# Patient Record
Sex: Male | Born: 1955 | Race: White | Hispanic: No | Smoking: Former smoker
Health system: Southern US, Community
[De-identification: ages and names within clinical notes are randomized; demographics above are authoritative.]

## PROBLEM LIST (undated history)

## (undated) DIAGNOSIS — M1990 Unspecified osteoarthritis, unspecified site: Secondary | ICD-10-CM

## (undated) DIAGNOSIS — F32A Depression, unspecified: Secondary | ICD-10-CM

## (undated) DIAGNOSIS — M419 Scoliosis, unspecified: Secondary | ICD-10-CM

## (undated) DIAGNOSIS — J189 Pneumonia, unspecified organism: Secondary | ICD-10-CM

## (undated) DIAGNOSIS — F329 Major depressive disorder, single episode, unspecified: Secondary | ICD-10-CM

## (undated) DIAGNOSIS — I10 Essential (primary) hypertension: Secondary | ICD-10-CM

## (undated) DIAGNOSIS — D649 Anemia, unspecified: Secondary | ICD-10-CM

## (undated) DIAGNOSIS — Z8619 Personal history of other infectious and parasitic diseases: Secondary | ICD-10-CM

## (undated) DIAGNOSIS — T7840XA Allergy, unspecified, initial encounter: Secondary | ICD-10-CM

## (undated) HISTORY — DX: Personal history of other infectious and parasitic diseases: Z86.19

## (undated) HISTORY — DX: Depression, unspecified: F32.A

## (undated) HISTORY — PX: COLONOSCOPY: SHX174

## (undated) HISTORY — DX: Essential (primary) hypertension: I10

## (undated) HISTORY — DX: Allergy, unspecified, initial encounter: T78.40XA

## (undated) HISTORY — DX: Major depressive disorder, single episode, unspecified: F32.9

## (undated) HISTORY — PX: TONSILLECTOMY AND ADENOIDECTOMY: SHX28

---

## 2017-11-21 LAB — HM COLONOSCOPY

## 2018-05-02 ENCOUNTER — Ambulatory Visit (INDEPENDENT_AMBULATORY_CARE_PROVIDER_SITE_OTHER): Payer: Self-pay | Admitting: Nurse Practitioner

## 2018-05-02 ENCOUNTER — Other Ambulatory Visit (INDEPENDENT_AMBULATORY_CARE_PROVIDER_SITE_OTHER): Payer: Self-pay

## 2018-05-02 ENCOUNTER — Encounter: Payer: Self-pay | Admitting: Nurse Practitioner

## 2018-05-02 VITALS — BP 160/110 | HR 66 | Ht 69.75 in | Wt 165.0 lb

## 2018-05-02 DIAGNOSIS — R202 Paresthesia of skin: Secondary | ICD-10-CM

## 2018-05-02 DIAGNOSIS — Z13220 Encounter for screening for lipoid disorders: Secondary | ICD-10-CM

## 2018-05-02 DIAGNOSIS — I10 Essential (primary) hypertension: Secondary | ICD-10-CM

## 2018-05-02 LAB — CBC
HEMATOCRIT: 40.9 % (ref 39.0–52.0)
HEMOGLOBIN: 14.2 g/dL (ref 13.0–17.0)
MCHC: 34.6 g/dL (ref 30.0–36.0)
MCV: 92.6 fl (ref 78.0–100.0)
Platelets: 297 10*3/uL (ref 150.0–400.0)
RBC: 4.42 Mil/uL (ref 4.22–5.81)
RDW: 13 % (ref 11.5–15.5)
WBC: 5.5 10*3/uL (ref 4.0–10.5)

## 2018-05-02 LAB — VITAMIN B12: Vitamin B-12: 300 pg/mL (ref 211–911)

## 2018-05-02 LAB — TSH: TSH: 1.08 u[IU]/mL (ref 0.35–4.50)

## 2018-05-02 LAB — LIPID PANEL
CHOL/HDL RATIO: 2
Cholesterol: 139 mg/dL (ref 0–200)
HDL: 63.6 mg/dL (ref >39.00)
LDL Cholesterol: 62 mg/dL (ref 0–99)
NonHDL: 75.27
Triglycerides: 66 mg/dL (ref 0.0–149.0)
VLDL: 13.2 mg/dL (ref 0.0–40.0)

## 2018-05-02 LAB — COMPREHENSIVE METABOLIC PANEL
ALT: 12 U/L (ref 0–53)
AST: 14 U/L (ref 0–37)
Albumin: 4.1 g/dL (ref 3.5–5.2)
Alkaline Phosphatase: 64 U/L (ref 39–117)
BILIRUBIN TOTAL: 0.8 mg/dL (ref 0.2–1.2)
BUN: 18 mg/dL (ref 6–23)
CO2: 28 meq/L (ref 19–32)
CREATININE: 0.77 mg/dL (ref 0.40–1.50)
Calcium: 9.5 mg/dL (ref 8.4–10.5)
Chloride: 104 mEq/L (ref 96–112)
GFR: 107.95 mL/min (ref >60.00)
GLUCOSE: 99 mg/dL (ref 70–99)
Potassium: 4 mEq/L (ref 3.5–5.1)
Sodium: 139 mEq/L (ref 135–145)
Total Protein: 6.9 g/dL (ref 6.0–8.3)

## 2018-05-02 LAB — MAGNESIUM: Magnesium: 2.3 mg/dL (ref 1.5–2.5)

## 2018-05-02 MED ORDER — LISINOPRIL 40 MG PO TABS: 40.0000 mg | ORAL_TABLET | Freq: Every day | ORAL | 1 refills | Status: DC

## 2018-05-02 MED ORDER — HYDROCHLOROTHIAZIDE 25 MG PO TABS: 25.0000 mg | ORAL_TABLET | Freq: Every day | ORAL | 2 refills | Status: DC

## 2018-05-02 NOTE — Patient Instructions (Signed)
Please head downstairs for lab work. If any of your test results are critically abnormal, you will be contacted right away. Otherwise, I will contact you within a week about your test results and follow up recommendations  Please continue your HCTZ Please increase lisinopril to 40mg  daily Please return in about 2 weeks for follow up  It was nice to meet you. Thanks for letting me take care of you.   DASH Eating Plan DASH stands for "Dietary Approaches to Stop Hypertension." The DASH eating plan is a healthy eating plan that has been shown to reduce high blood pressure (hypertension). It may also reduce your risk for type 2 diabetes, heart disease, and stroke. The DASH eating plan may also help with weight loss. What are tips for following this plan? General guidelines  Avoid eating more than 2,300 mg (milligrams) of salt (sodium) a day. If you have hypertension, you may need to reduce your sodium intake to 1,500 mg a day.  Limit alcohol intake to no more than 1 drink a day for nonpregnant women and 2 drinks a day for men. One drink equals 12 oz of beer, 5 oz of wine, or 1 oz of hard liquor.  Work with your health care provider to maintain a healthy body weight or to lose weight. Ask what an ideal weight is for you.  Get at least 30 minutes of exercise that causes your heart to beat faster (aerobic exercise) most days of the week. Activities may include walking, swimming, or biking.  Work with your health care provider or diet and nutrition specialist (dietitian) to adjust your eating plan to your individual calorie needs. Reading food labels  Check food labels for the amount of sodium per serving. Choose foods with less than 5 percent of the Daily Value of sodium. Generally, foods with less than 300 mg of sodium per serving fit into this eating plan.  To find whole grains, look for the word "whole" as the first word in the ingredient list. Shopping  Buy products labeled as  "low-sodium" or "no salt added."  Buy fresh foods. Avoid canned foods and premade or frozen meals. Cooking  Avoid adding salt when cooking. Use salt-free seasonings or herbs instead of table salt or sea salt. Check with your health care provider or pharmacist before using salt substitutes.  Do not fry foods. Cook foods using healthy methods such as baking, boiling, grilling, and broiling instead.  Cook with heart-healthy oils, such as olive, canola, soybean, or sunflower oil. Meal planning   Eat a balanced diet that includes: ? 5 or more servings of fruits and vegetables each day. At each meal, try to fill half of your plate with fruits and vegetables. ? Up to 6-8 servings of whole grains each day. ? Less than 6 oz of lean meat, poultry, or fish each day. A 3-oz serving of meat is about the same size as a deck of cards. One egg equals 1 oz. ? 2 servings of low-fat dairy each day. ? A serving of nuts, seeds, or beans 5 times each week. ? Heart-healthy fats. Healthy fats called Omega-3 fatty acids are found in foods such as flaxseeds and coldwater fish, like sardines, salmon, and mackerel.  Limit how much you eat of the following: ? Canned or prepackaged foods. ? Food that is high in trans fat, such as fried foods. ? Food that is high in saturated fat, such as fatty meat. ? Sweets, desserts, sugary drinks, and other foods with added  sugar. ? Full-fat dairy products.  Do not salt foods before eating.  Try to eat at least 2 vegetarian meals each week.  Eat more home-cooked food and less restaurant, buffet, and fast food.  When eating at a restaurant, ask that your food be prepared with less salt or no salt, if possible. What foods are recommended? The items listed may not be a complete list. Talk with your dietitian about what dietary choices are best for you. Grains Whole-grain or whole-wheat bread. Whole-grain or whole-wheat pasta. Brown rice. [place]. Bulgur. Whole-grain  and low-sodium cereals. Pita bread. Low-fat, low-sodium crackers. Whole-wheat flour tortillas. Vegetables Fresh or frozen vegetables (raw, steamed, roasted, or grilled). Low-sodium or reduced-sodium tomato and vegetable juice. Low-sodium or reduced-sodium tomato sauce and tomato paste. Low-sodium or reduced-sodium canned vegetables. Fruits All fresh, dried, or frozen fruit. Canned fruit in natural juice (without added sugar). Meat and other protein foods Skinless chicken or [place]. Ground chicken or [place]. Pork with fat trimmed off. Fish and seafood. Egg whites. Dried beans, peas, or lentils. Unsalted nuts, nut butters, and seeds. Unsalted canned beans. Lean cuts of beef with fat trimmed off. Low-sodium, lean deli meat. Dairy Low-fat (1%) or fat-free (skim) milk. Fat-free, low-fat, or reduced-fat cheeses. Nonfat, low-sodium ricotta or cottage cheese. Low-fat or nonfat yogurt. Low-fat, low-sodium cheese. Fats and oils Soft margarine without trans fats. Vegetable oil. Low-fat, reduced-fat, or light mayonnaise and salad dressings (reduced-sodium). Canola, safflower, olive, soybean, and sunflower oils. Avocado. Seasoning and other foods Herbs. Spices. Seasoning mixes without salt. Unsalted popcorn and pretzels. Fat-free sweets. What foods are not recommended? The items listed may not be a complete list. Talk with your dietitian about what dietary choices are best for you. Grains Baked goods made with fat, such as croissants, muffins, or some breads. Dry pasta or rice meal packs. Vegetables Creamed or fried vegetables. Vegetables in a cheese sauce. Regular canned vegetables (not low-sodium or reduced-sodium). Regular canned tomato sauce and paste (not low-sodium or reduced-sodium). Regular tomato and vegetable juice (not low-sodium or reduced-sodium). Angie Fava. Olives. Fruits Canned fruit in a light or heavy syrup. Fried fruit. Fruit in cream or butter sauce. Meat and other protein foods Fatty cuts  of meat. Ribs. Fried meat. Berniece Salines. Sausage. Bologna and other processed lunch meats. Salami. Fatback. Hotdogs. Bratwurst. Salted nuts and seeds. Canned beans with added salt. Canned or smoked fish. Whole eggs or egg yolks. Chicken or [place] with skin. Dairy Whole or 2% milk, cream, and half-and-half. Whole or full-fat cream cheese. Whole-fat or sweetened yogurt. Full-fat cheese. Nondairy creamers. Whipped toppings. Processed cheese and cheese spreads. Fats and oils Butter. Stick margarine. Lard. Shortening. Ghee. Bacon fat. Tropical oils, such as coconut, palm kernel, or palm oil. Seasoning and other foods Salted popcorn and pretzels. Onion salt, garlic salt, seasoned salt, table salt, and sea salt. Worcestershire sauce. Tartar sauce. Barbecue sauce. Teriyaki sauce. Soy sauce, including reduced-sodium. Steak sauce. Canned and packaged gravies. Fish sauce. Oyster sauce. Cocktail sauce. Horseradish that you find on the shelf. Ketchup. Mustard. Meat flavorings and tenderizers. Bouillon cubes. Hot sauce and Tabasco sauce. Premade or packaged marinades. Premade or packaged taco seasonings. Relishes. Regular salad dressings. Where to find more information:  National Heart, Lung, and [place]: https://wilson-eaton.com/  American Heart Association: www.heart.org Summary  The DASH eating plan is a healthy eating plan that has been shown to reduce high blood pressure (hypertension). It may also reduce your risk for type 2 diabetes, heart disease, and stroke.  With the DASH eating  plan, you should limit salt (sodium) intake to 2,300 mg a day. If you have hypertension, you may need to reduce your sodium intake to 1,500 mg a day.  When on the DASH eating plan, aim to eat more fresh fruits and vegetables, whole grains, lean proteins, low-fat dairy, and heart-healthy fats.  Work with your health care provider or diet and nutrition specialist (dietitian) to adjust your eating plan to your individual calorie  needs. This information is not intended to replace advice given to you by your health care provider. Make sure you discuss any questions you have with your health care provider. Document Released: 08/16/2011 Document Revised: 08/20/2016 Document Reviewed: 08/20/2016 Elsevier Interactive Patient Education  [employer].

## 2018-05-02 NOTE — Assessment & Plan Note (Signed)
Will increase lisinopril dosage today, continue HCTZ dosage at home Encouraged to monitor BP at home Additional education provided on AVS RTC in 2 weeks for F/U- recheck BP, BMET - hydrochlorothiazide (HYDRODIURIL) 25 MG tablet; Take 1 tablet (25 mg total) by mouth daily.  Dispense: 90 tablet; Refill: 2 - lisinopril (PRINIVIL,ZESTRIL) 40 MG tablet; Take 1 tablet (40 mg total) by mouth daily.  Dispense: 30 tablet; Refill: 1 - Lipid panel; Future - TSH; Future - CBC; Future - Comprehensive metabolic panel; Future

## 2018-05-02 NOTE — Progress Notes (Signed)
Name: Henry [name]   MRN: [id number]    DOB: 1955-09-25   Date:05/02/2018       Progress Note  Subjective  Chief Complaint  Establish care  HPI  Henry [name] is here today as a new patient to our practice, just moved back to [place] from [place] after his wife passed away, no family here but did grow up here and has moved back here to live with a friend. He is retired but has been active around [employer], joined a church and enjoys local music around town. Aside from his prior PCP in [place], he does not routinely see any specialists. He is maintained on daily blood pressure medications, needing refill today. He would also like to discuss complaint of numbness and tingling. He is maintained on lisinopril 20, HCTZ 25 daily, has been taking both of these medications for some time now, reports he tolerates both medications well without any noted adverse side effects. He does occasionally check his BP at home, readings typically around 140s/90s and although he says his blood pressure is often higher in medical settings, his prior PCP had discussed possibly increasing his bp med doses due to continuing to have elevated bp readings, he is willing to increase medication dosages today if needed. He tells me that hes noticed a mild tingling sensation to left fingertips and left toes which has persisted over past several months now. No pain, weakness, syncope, confusion, headaches, vision changes, chest pain, shortness of breath, edema. He is an occasional drinker.   BP Readings from Last 3 Encounters:  05/02/18 (!) 160/110    Past Surgical History:  Procedure Laterality Date  . TONSILLECTOMY AND ADENOIDECTOMY      History reviewed. No pertinent family history.  Social History   Socioeconomic History  . Marital status: Married    Spouse name: Not on file  . Number of children: Not on file  . Years of education: Not on file  . Highest education level: Not on file  Occupational History  . Not on file   Social Needs  . Financial resource strain: Not on file  . Food insecurity:    Worry: Not on file    Inability: Not on file  . Transportation needs:    Medical: Not on file    Non-medical: Not on file  Tobacco Use  . Smoking status: Former Research scientist (life sciences)  . Smokeless tobacco: Never Used  Substance and Sexual Activity  . Alcohol use: Yes  . Drug use: Not Currently  . Sexual activity: Not on file  Lifestyle  . Physical activity:    Days per week: Not on file    Minutes per session: Not on file  . Stress: Not on file  Relationships  . Social connections:    Talks on phone: Not on file    Gets together: Not on file    Attends religious service: Not on file    Active member of club or organization: Not on file    Attends meetings of clubs or organizations: Not on file    Relationship status: Not on file  . Intimate partner violence:    Fear of current or ex partner: Not on file    Emotionally abused: Not on file    Physically abused: Not on file    Forced sexual activity: Not on file  Other Topics Concern  . Not on file  Social History Narrative  . Not on file     Current Outpatient Medications:  .  hydrochlorothiazide (HYDRODIURIL) 25 MG tablet, Take 1 tablet (25 mg total) by mouth daily., Disp: 90 tablet, Rfl: 2 .  lisinopril (PRINIVIL,ZESTRIL) 40 MG tablet, Take 1 tablet (40 mg total) by mouth daily., Disp: 30 tablet, Rfl: 1  No Known Allergies   ROS See HPI  Objective  Vitals:   05/02/18 0931  BP: (!) 160/110  Pulse: 66  SpO2: 97%  Weight: 165 lb (74.8 kg)  Height: 5' 9.75" (1.772 m)    Body mass index is 23.85 kg/m.  Physical Exam Vital signs reviewed. Constitutional: Patient appears well-developed and well-nourished. No distress.  HENT: Head: Normocephalic and atraumatic.  Nose: Nose normal. Mouth/Throat: Oropharynx is clear and moist. No oropharyngeal exudate.  Eyes: Conjunctivae and EOM are normal. Pupils are equal, round, and reactive to light. No  scleral icterus.  Neck: Normal range of motion. Neck supple. No thyromegaly present. Cardiovascular: Normal rate, regular rhythm and normal heart sounds.  No murmur heard. No BLE edema. Distal pulses intact. Pulmonary/Chest: Effort normal and breath sounds normal. No respiratory distress. Musculoskeletal: Normal range of motion, No gross deformities Neurological: He is alert and oriented to person, place, and time. No cranial nerve deficit. Coordination, balance, strength, speech and gait are normal.  Skin: Skin is warm and dry. No rash noted. No erythema.  Psychiatric: Patient has a normal mood and affect. behavior is normal. Judgment and thought content normal.  Assessment & Plan RTC in 2 weeks for F/U: HTN- increasing lisinopril, recheck BP, repeat BMET  Paresthesia Labs today F/U with further recommendations pending lab results - Vitamin B12; Future - Magnesium; Future - TSH; Future - CBC; Future - Comprehensive metabolic panel; Future  Screening for cholesterol level Labs today F/U with further recommendations pending lab results Unsure if he is fasting - Lipid panel; Future

## 2018-05-08 ENCOUNTER — Encounter: Payer: Self-pay | Admitting: *Deleted

## 2018-05-21 ENCOUNTER — Other Ambulatory Visit (INDEPENDENT_AMBULATORY_CARE_PROVIDER_SITE_OTHER): Payer: Self-pay

## 2018-05-21 ENCOUNTER — Other Ambulatory Visit: Payer: Self-pay

## 2018-05-21 ENCOUNTER — Encounter: Payer: Self-pay | Admitting: Nurse Practitioner

## 2018-05-21 ENCOUNTER — Ambulatory Visit (INDEPENDENT_AMBULATORY_CARE_PROVIDER_SITE_OTHER): Payer: Self-pay | Admitting: Nurse Practitioner

## 2018-05-21 VITALS — BP 130/82 | HR 74 | Ht 69.75 in | Wt 164.0 lb

## 2018-05-21 DIAGNOSIS — I10 Essential (primary) hypertension: Secondary | ICD-10-CM

## 2018-05-21 DIAGNOSIS — Z1159 Encounter for screening for other viral diseases: Secondary | ICD-10-CM

## 2018-05-21 DIAGNOSIS — Z114 Encounter for screening for human immunodeficiency virus [HIV]: Secondary | ICD-10-CM

## 2018-05-21 DIAGNOSIS — Z9189 Other specified personal risk factors, not elsewhere classified: Secondary | ICD-10-CM

## 2018-05-21 DIAGNOSIS — Z23 Encounter for immunization: Secondary | ICD-10-CM

## 2018-05-21 LAB — BASIC METABOLIC PANEL
BUN: 14 mg/dL (ref 6–23)
CHLORIDE: 99 meq/L (ref 96–112)
CO2: 27 meq/L (ref 19–32)
CREATININE: 0.86 mg/dL (ref 0.40–1.50)
Calcium: 9.2 mg/dL (ref 8.4–10.5)
GFR: 95 mL/min (ref >60.00)
GLUCOSE: 93 mg/dL (ref 70–99)
Potassium: 3.8 mEq/L (ref 3.5–5.1)
SODIUM: 135 meq/L (ref 135–145)

## 2018-05-21 NOTE — Patient Instructions (Addendum)
Please schedule an office or nurse visit in 3 months for a blood pressure recheck, so we can make sure your blood pressure remains stable  It was nice to see you today!   Hypertension Hypertension is another name for high blood pressure. High blood pressure forces your heart to work harder to pump blood. This can cause problems over time. There are two numbers in a blood pressure reading. There is a top number (systolic) over a bottom number (diastolic). It is best to have a blood pressure below 120/80. Healthy choices can help lower your blood pressure. You may need medicine to help lower your blood pressure if:  Your blood pressure cannot be lowered with healthy choices.  Your blood pressure is higher than 130/80.  Follow these instructions at home: Eating and drinking  If directed, follow the DASH eating plan. This diet includes: ? Filling half of your plate at each meal with fruits and vegetables. ? Filling one quarter of your plate at each meal with whole grains. Whole grains include whole wheat pasta, brown rice, and whole grain bread. ? Eating or drinking low-fat dairy products, such as skim milk or low-fat yogurt. ? Filling one quarter of your plate at each meal with low-fat (lean) proteins. Low-fat proteins include fish, skinless chicken, eggs, beans, and tofu. ? Avoiding fatty meat, cured and processed meat, or chicken with skin. ? Avoiding premade or processed food.  Eat less than 1,500 mg of salt (sodium) a day.  Limit alcohol use to no more than 1 drink a day for nonpregnant women and 2 drinks a day for men. One drink equals 12 oz of beer, 5 oz of wine, or 1 oz of hard liquor. Lifestyle  Work with your doctor to stay at a healthy weight or to lose weight. Ask your doctor what the best weight is for you.  Get at least 30 minutes of exercise that causes your heart to beat faster (aerobic exercise) most days of the week. This may include walking, swimming, or biking.  Get  at least 30 minutes of exercise that strengthens your muscles (resistance exercise) at least 3 days a week. This may include lifting weights or pilates.  Do not use any products that contain nicotine or tobacco. This includes cigarettes and e-cigarettes. If you need help quitting, ask your doctor.  Check your blood pressure at home as told by your doctor.  Keep all follow-up visits as told by your doctor. This is important. Medicines  Take over-the-counter and prescription medicines only as told by your doctor. Follow directions carefully.  Do not skip doses of blood pressure medicine. The medicine does not work as well if you skip doses. Skipping doses also puts you at risk for problems.  Ask your doctor about side effects or reactions to medicines that you should watch for. Contact a doctor if:  You think you are having a reaction to the medicine you are taking.  You have headaches that keep coming back (recurring).  You feel dizzy.  You have swelling in your ankles.  You have trouble with your vision. Get help right away if:  You get a very bad headache.  You start to feel confused.  You feel weak or numb.  You feel faint.  You get very bad pain in your: ? Chest. ? Belly (abdomen).  You throw up (vomit) more than once.  You have trouble breathing. Summary  Hypertension is another name for high blood pressure.  Making healthy choices can  help lower blood pressure. If your blood pressure cannot be controlled with healthy choices, you may need to take medicine. This information is not intended to replace advice given to you by your health care provider. Make sure you discuss any questions you have with your health care provider. Document Released: 02/13/2008 Document Revised: 07/25/2016 Document Reviewed: 07/25/2016 Elsevier Interactive Patient Education  [employer].

## 2018-05-21 NOTE — Assessment & Plan Note (Signed)
BP has improved with medication adjustments, will continue current dosages RTC in 3 months for F/U, to recheck BP Additional education provided on AVS - Basic metabolic panel; Future

## 2018-05-21 NOTE — Progress Notes (Signed)
Name: Henry [name]   MRN: [id number]    DOB: [birth year]   Date:05/21/2018       Progress Note  Subjective  Chief Complaint Follow up  HPI  Henry [name] is here today for HTN follow up, we will review annual HM as well. He has been maintained on HCTZ 25 for HTN, and his lisinopril was increased to 40 daily at last OV on 05/02/18 due to elevated BP. Reports daily medication compliance and has increased lisinopril dosage as instructed without noted adverse medication effects. Reports he does not routinely check BP readings at home Denies headaches, vision changes, chest pain, shortness of breath, edema.  BP Readings from Last 3 Encounters:  05/21/18 130/82  05/02/18 (!) 160/110    Patient Active Problem List   Diagnosis Date Noted  . Essential hypertension 05/02/2018    Past Surgical History:  Procedure Laterality Date  . TONSILLECTOMY AND ADENOIDECTOMY      History reviewed. No pertinent family history.  Social History   Socioeconomic History  . Marital status: Married    Spouse name: Not on file  . Number of children: Not on file  . Years of education: Not on file  . Highest education level: Not on file  Occupational History  . Not on file  Social Needs  . Financial resource strain: Not on file  . Food insecurity:    Worry: Not on file    Inability: Not on file  . Transportation needs:    Medical: Not on file    Non-medical: Not on file  Tobacco Use  . Smoking status: Former Research scientist (life sciences)  . Smokeless tobacco: Never Used  Substance and Sexual Activity  . Alcohol use: Yes  . Drug use: Not Currently  . Sexual activity: Not on file  Lifestyle  . Physical activity:    Days per week: Not on file    Minutes per session: Not on file  . Stress: Not on file  Relationships  . Social connections:    Talks on phone: Not on file    Gets together: Not on file    Attends religious service: Not on file    Active member of club or organization: Not on file    Attends meetings of  clubs or organizations: Not on file    Relationship status: Not on file  . Intimate partner violence:    Fear of current or ex partner: Not on file    Emotionally abused: Not on file    Physically abused: Not on file    Forced sexual activity: Not on file  Other Topics Concern  . Not on file  Social History Narrative  . Not on file     Current Outpatient Medications:  .  hydrochlorothiazide (HYDRODIURIL) 25 MG tablet, Take 1 tablet (25 mg total) by mouth daily., Disp: 90 tablet, Rfl: 2 .  lisinopril (PRINIVIL,ZESTRIL) 40 MG tablet, Take 1 tablet (40 mg total) by mouth daily., Disp: 30 tablet, Rfl: 1  No Known Allergies   ROS See HPI  Objective  Vitals:   05/21/18 0919  BP: 130/82  Pulse: 74  SpO2: 96%  Weight: 164 lb (74.4 kg)  Height: 5' 9.75" (1.772 m)    Body mass index is 23.7 kg/m.  Physical Exam Vital signs reviewed. Constitutional: Patient appears well-developed and well-nourished. No distress.  HENT: Head: Normocephalic and atraumatic. Nose: Nose normal. Mouth/Throat: Oropharynx is clear and moist. No oropharyngeal exudate.  Eyes: Conjunctivae and EOM are normal. Pupils are equal,  round, and reactive to light. No scleral icterus.  Neck: Normal range of motion. Neck supple.  Cardiovascular: Normal rate, regular rhythm and normal heart sounds.  No murmur heard. No BLE edema. Distal pulses intact. Pulmonary/Chest: Effort normal and breath sounds normal. No respiratory distress. Neurological: He is alert and oriented to person, place, and time. No cranial nerve deficit. Coordination, balance, strength, speech and gait are normal.  Skin: Skin is warm and dry. No rash noted. No erythema.  Psychiatric: Patient has a normal mood and affect. behavior is normal. Judgment and thought content normal.   Assessment & Plan RTC in 3 months for F/U: HTN-recheck BP reading  -Reviewed Health Maintenance: DECLINES influenza vacc PER PATIENT, colonoscopy up to date, done  last year in [place], he does not recall ordering physicians name but will call back to our office with this information so we can request his records to update colonoscopy  in his chart Screening for HIV (human immunodeficiency virus)- HIV antibody; Future Encounter for hepatitis C virus screening test for high risk patient- Hepatitis C antibody; Future Need for Tdap vaccination- Tdap vaccine greater than or equal to 7yo IM

## 2018-05-26 LAB — HCV RNA,QUANTITATIVE REAL TIME PCR
HCV Quantitative Log: <1.18 Log IU/mL
HCV RNA, PCR, QN: <15 IU/mL

## 2018-05-26 LAB — HEPATITIS C ANTIBODY
Hepatitis C Ab: REACTIVE — AB
SIGNAL TO CUT-OFF: 25 — ABNORMAL HIGH (ref <1.00)

## 2018-05-26 LAB — HIV ANTIBODY (ROUTINE TESTING W REFLEX): HIV: NONREACTIVE

## 2018-06-06 ENCOUNTER — Encounter: Payer: Self-pay | Admitting: *Deleted

## 2018-07-06 ENCOUNTER — Other Ambulatory Visit: Payer: Self-pay | Admitting: Nurse Practitioner

## 2018-07-06 DIAGNOSIS — I10 Essential (primary) hypertension: Secondary | ICD-10-CM

## 2018-08-20 ENCOUNTER — Ambulatory Visit: Payer: Self-pay

## 2018-09-18 ENCOUNTER — Telehealth: Payer: Self-pay | Admitting: Internal Medicine

## 2018-09-18 NOTE — Telephone Encounter (Signed)
Copied from [place] [id number]. Topic: Appointment Scheduling - Transfer of Care >> Sep 17, 2018  2:48 PM Rutherford Nail, [place] wrote: Pt is requesting to transfer FROM: Henry [name] Pt is requesting to transfer TO: [name] Reason for requested transfer: Requesting Dr Ronnald Ramp and his friend Benedict Needy states that he spoke with Dr Ronnald Ramp and states that he said he would take him on as a patient.  Send CRM to patient's current PCP (transferring FROM).

## 2018-09-18 NOTE — Telephone Encounter (Signed)
Okay with me 

## 2018-09-19 NOTE — Telephone Encounter (Signed)
Yes, I will see him.

## 2018-09-22 NOTE — Telephone Encounter (Signed)
appt scheduled

## 2018-11-12 ENCOUNTER — Other Ambulatory Visit: Payer: Self-pay | Admitting: *Deleted

## 2018-11-12 DIAGNOSIS — I10 Essential (primary) hypertension: Secondary | ICD-10-CM

## 2018-11-12 MED ORDER — LISINOPRIL 40 MG PO TABS: 40.0000 mg | ORAL_TABLET | Freq: Every day | ORAL | 1 refills | Status: DC

## 2018-11-17 ENCOUNTER — Other Ambulatory Visit (INDEPENDENT_AMBULATORY_CARE_PROVIDER_SITE_OTHER): Payer: Medicare Other

## 2018-11-17 ENCOUNTER — Encounter: Payer: Self-pay | Admitting: Internal Medicine

## 2018-11-17 ENCOUNTER — Ambulatory Visit (INDEPENDENT_AMBULATORY_CARE_PROVIDER_SITE_OTHER)
Admission: RE | Admit: 2018-11-17 | Discharge: 2018-11-17 | Disposition: A | Discharge status: Home / Self Care | Payer: Medicare Other | Source: Ambulatory Visit | Attending: Internal Medicine | Admitting: Internal Medicine

## 2018-11-17 ENCOUNTER — Ambulatory Visit (INDEPENDENT_AMBULATORY_CARE_PROVIDER_SITE_OTHER): Payer: Medicare Other | Admitting: Internal Medicine

## 2018-11-17 VITALS — HR 60 | Temp 98.0°F | Ht 69.75 in | Wt 164.2 lb

## 2018-11-17 DIAGNOSIS — A6001 Herpesviral infection of penis: Secondary | ICD-10-CM | POA: Diagnosis not present

## 2018-11-17 DIAGNOSIS — I10 Essential (primary) hypertension: Secondary | ICD-10-CM

## 2018-11-17 DIAGNOSIS — R202 Paresthesia of skin: Secondary | ICD-10-CM

## 2018-11-17 DIAGNOSIS — F321 Major depressive disorder, single episode, moderate: Secondary | ICD-10-CM | POA: Diagnosis not present

## 2018-11-17 DIAGNOSIS — N400 Enlarged prostate without lower urinary tract symptoms: Secondary | ICD-10-CM

## 2018-11-17 DIAGNOSIS — M5416 Radiculopathy, lumbar region: Secondary | ICD-10-CM | POA: Insufficient documentation

## 2018-11-17 DIAGNOSIS — R200 Anesthesia of skin: Secondary | ICD-10-CM | POA: Diagnosis not present

## 2018-11-17 DIAGNOSIS — Z23 Encounter for immunization: Secondary | ICD-10-CM | POA: Diagnosis not present

## 2018-11-17 DIAGNOSIS — Z0000 Encounter for general adult medical examination without abnormal findings: Secondary | ICD-10-CM | POA: Insufficient documentation

## 2018-11-17 DIAGNOSIS — M545 Low back pain: Secondary | ICD-10-CM | POA: Diagnosis not present

## 2018-11-17 LAB — VITAMIN B12: Vitamin B-12: 958 pg/mL — ABNORMAL HIGH (ref 211–911)

## 2018-11-17 LAB — CBC WITH DIFFERENTIAL/PLATELET
Basophils Absolute: 0 10*3/uL (ref 0.0–0.1)
Basophils Relative: 0.4 % (ref 0.0–3.0)
Eosinophils Absolute: 0 10*3/uL (ref 0.0–0.7)
Eosinophils Relative: 0.1 % (ref 0.0–5.0)
HEMATOCRIT: 41 % (ref 39.0–52.0)
HEMOGLOBIN: 14.4 g/dL (ref 13.0–17.0)
Lymphocytes Relative: 15.8 % (ref 12.0–46.0)
Lymphs Abs: 1.1 10*3/uL (ref 0.7–4.0)
MCHC: 35.2 g/dL (ref 30.0–36.0)
MCV: 91.3 fl (ref 78.0–100.0)
Monocytes Absolute: 0.6 10*3/uL (ref 0.1–1.0)
Monocytes Relative: 7.9 % (ref 3.0–12.0)
Neutro Abs: 5.4 10*3/uL (ref 1.4–7.7)
Neutrophils Relative %: 75.8 % (ref 43.0–77.0)
Platelets: 286 10*3/uL (ref 150.0–400.0)
RBC: 4.49 Mil/uL (ref 4.22–5.81)
RDW: 12.9 % (ref 11.5–15.5)
WBC: 7.1 10*3/uL (ref 4.0–10.5)

## 2018-11-17 LAB — FOLATE: Folate: 17.6 ng/mL (ref >5.9)

## 2018-11-17 LAB — BASIC METABOLIC PANEL
BUN: 19 mg/dL (ref 6–23)
CALCIUM: 9.4 mg/dL (ref 8.4–10.5)
CO2: 28 mEq/L (ref 19–32)
Chloride: 101 mEq/L (ref 96–112)
Creatinine, Ser: 0.78 mg/dL (ref 0.40–1.50)
GFR: 99.89 mL/min (ref >60.00)
Glucose, Bld: 97 mg/dL (ref 70–99)
Potassium: 3.8 mEq/L (ref 3.5–5.1)
SODIUM: 138 meq/L (ref 135–145)

## 2018-11-17 LAB — PSA: PSA: 0.56 ng/mL (ref 0.10–4.00)

## 2018-11-17 LAB — TSH: TSH: 1.17 u[IU]/mL (ref 0.35–4.50)

## 2018-11-17 MED ORDER — DULOXETINE HCL 30 MG PO CPEP: 30.0000 mg | ORAL_CAPSULE | Freq: Every day | ORAL | 0 refills | Status: DC

## 2018-11-17 MED ORDER — VALACYCLOVIR HCL 1 G PO TABS: 1000.0000 mg | ORAL_TABLET | Freq: Two times a day (BID) | ORAL | 0 refills | Status: AC

## 2018-11-17 NOTE — Assessment & Plan Note (Addendum)

## 2018-11-17 NOTE — Progress Notes (Signed)
Subjective:  Patient ID: MCCAULEY DIEHL, male    DOB: 03-15-1956  Age: 63 y.o. MRN: [id number]  CC: Annual Exam and Hypertension  NEW TO ME  HPI ARIHAAN BELLUCCI presents for a CPX.  His wife died a year ago and since then he has been experiencing a worsening depression.  He was treated for depression about 15 years ago and took Lexapro.  He complains that Lexapro caused fatigue.  He complains of crying spells, anhedonia, feeling hopeless and helpless, and fatigue.  He denies SI or HI.  He has a history of genital herpes and over the last week has had painful blisters on his penis.  He wants to take an antiviral.  He has a history of scoliosis and complains of a several year history of worsening low back pain that radiates into his lower extremities.  He has numbness and tingling in both legs, worse on the left than the right.  He takes tramadol for pain and frequently sees a Restaurant manager, fast food.  He does not know if he is recently had x-rays of his lower back.  Outpatient Medications Prior to Visit  Medication Sig Dispense Refill  . ALPRAZolam (XANAX) 1 MG tablet Take 1 mg by mouth at bedtime as needed for anxiety.    . carisoprodol (SOMA) 350 MG tablet Take 350 mg by mouth 4 (four) times daily as needed for muscle spasms.    . hydrochlorothiazide (HYDRODIURIL) 25 MG tablet Take 1 tablet (25 mg total) by mouth daily. 90 tablet 2  . lisinopril (PRINIVIL,ZESTRIL) 40 MG tablet Take 1 tablet (40 mg total) by mouth daily. 30 tablet 1  . traMADol (ULTRAM) 50 MG tablet Take by mouth every 6 (six) hours as needed.     No facility-administered medications prior to visit.     ROS Review of Systems  Constitutional: Positive for fatigue. Negative for diaphoresis and unexpected weight change.  HENT: Negative.  Negative for sore throat and trouble swallowing.   Eyes: Negative for visual disturbance.  Respiratory: Negative for cough, chest tightness, shortness of breath and wheezing.   Cardiovascular:  Negative for chest pain, palpitations and leg swelling.  Gastrointestinal: Negative for abdominal pain, constipation, diarrhea, nausea and vomiting.  Genitourinary: Positive for genital sores. Negative for decreased urine volume, difficulty urinating, discharge, dysuria, hematuria, penile swelling, scrotal swelling, testicular pain and urgency.  Musculoskeletal: Positive for back pain. Negative for arthralgias, gait problem and myalgias.  Skin: Negative.  Negative for color change, pallor and rash.  Neurological: Positive for numbness. Negative for dizziness and weakness.  Hematological: Negative for adenopathy. Does not bruise/bleed easily.  Psychiatric/Behavioral: Positive for dysphoric mood and sleep disturbance. Negative for agitation, behavioral problems, confusion, decreased concentration, hallucinations, self-injury and suicidal ideas. The patient is not nervous/anxious.     Objective:  Pulse 60   Temp 98 F (36.7 C) (Oral)   Ht 5' 9.75" (1.772 m)   Wt 164 lb 4 oz (74.5 kg)   SpO2 98%   BMI 23.74 kg/m   BP Readings from Last 3 Encounters:  05/21/18 130/82  05/02/18 (!) 160/110    Wt Readings from Last 3 Encounters:  11/17/18 164 lb 4 oz (74.5 kg)  05/21/18 164 lb (74.4 kg)  05/02/18 165 lb (74.8 kg)    Physical Exam Vitals signs reviewed.  Constitutional:      Appearance: He is not ill-appearing or diaphoretic.  HENT:     Nose: Nose normal. No congestion or rhinorrhea.     Mouth/Throat:  Mouth: Mucous membranes are moist.     Pharynx: Oropharynx is clear. No oropharyngeal exudate or posterior oropharyngeal erythema.  Eyes:     General: No scleral icterus.    Conjunctiva/sclera: Conjunctivae normal.  Neck:     Musculoskeletal: Normal range of motion and neck supple. No muscular tenderness.  Cardiovascular:     Rate and Rhythm: Normal rate and regular rhythm.     Heart sounds: No murmur. No gallop.      Comments: EKG---  Sinus  Bradycardia  WITHIN NORMAL  LIMITS Pulmonary:     Effort: Pulmonary effort is normal.     Breath sounds: No stridor. No wheezing, rhonchi or rales.  Abdominal:     General: Bowel sounds are normal.     Palpations: There is no mass.     Tenderness: There is no abdominal tenderness. There is no guarding.     Hernia: There is no hernia in the right inguinal area or left inguinal area.  Genitourinary:    Pubic Area: Rash present.     Penis: Circumcised. Erythema and lesions present. No tenderness, discharge or swelling.      Scrotum/Testes: Normal.        Right: Mass or tenderness not present.        Left: Mass or tenderness not present. Left testicular hydrocele: at the base of tha glans there are scattered groups of slightly erythematous papules.     Epididymis:     Right: Normal.     Left: Normal.     Prostate: Enlarged (1+ smooth symm BPH). Not tender and no nodules present.     Rectum: Normal. Guaiac result negative. No mass, tenderness, anal fissure, external hemorrhoid or internal hemorrhoid. Normal anal tone.  Musculoskeletal: Normal range of motion.        General: No swelling.     Lumbar back: He exhibits deformity (kyphosis and scoliosis). He exhibits no tenderness, no bony tenderness, no pain and no spasm.     Right lower leg: No edema.     Left lower leg: No edema.  Lymphadenopathy:     Cervical: No cervical adenopathy.     Lower Body: No right inguinal adenopathy. No left inguinal adenopathy.  Skin:    General: Skin is warm and dry.     Coloration: Skin is not pale.     Findings: No erythema or rash.  Neurological:     General: No focal deficit present.     Mental Status: He is oriented to person, place, and time.     Cranial Nerves: Cranial nerves are intact. No cranial nerve deficit or facial asymmetry.     Sensory: Sensation is intact. No sensory deficit.     Motor: Motor function is intact. No weakness.     Coordination: Coordination is intact.     Lab Results  Component Value Date    WBC 7.1 11/17/2018   HGB 14.4 11/17/2018   HCT 41.0 11/17/2018   PLT 286.0 11/17/2018   GLUCOSE 97 11/17/2018   CHOL 139 05/02/2018   TRIG 66.0 05/02/2018   HDL 63.60 05/02/2018   LDLCALC 62 05/02/2018   ALT 12 05/02/2018   AST 14 05/02/2018   NA 138 11/17/2018   K 3.8 11/17/2018   CL 101 11/17/2018   CREATININE 0.78 11/17/2018   BUN 19 11/17/2018   CO2 28 11/17/2018   TSH 1.17 11/17/2018   PSA 0.56 11/17/2018    Dg Lumbar Spine Complete  Result Date: 11/17/2018 CLINICAL DATA:  Chronic low back pain with recent progression. Bilateral leg pain and numbness. Scoliosis. EXAM: LUMBAR SPINE - COMPLETE 4+ VIEW COMPARISON:  None. FINDINGS: 5 non rib-bearing lumbar type vertebral bodies are present. The lowest fully formed vertebral body is L5. Levoconvex curvature of the lumbar spine is centered at L3-4. There is left lateral listhesis at L4-5. There is retrolisthesis at L2-3 and L3-4. Asymmetric endplate changes are noted on the right at L3-4 and L4-5. IMPRESSION: 1. 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 2. Significant listhesis at L2-3, L3-4, and L4-5. 3. Asymmetric endplate changes on the right at L3-4 and L4-5. Electronically Signed   By: San Morelle M.D.   On: 11/17/2018 17:08    Assessment & Plan:   Idan was seen today for annual exam and hypertension.  Diagnoses and all orders for this visit:  Left lumbar radiculitis- The plain x-ray shows a complicated mechanical problem in his lower back.  I recommended that he see neurosurgery to consider surgical options. -     DG Lumbar Spine Complete; Future -     Ambulatory referral to Neurosurgery  Benign prostatic hyperplasia without lower urinary tract symptoms- His PSA is normal which is reassuring that he does not have prostate cancer.  He has no symptoms that need to be treated. -     PSA; Future -     Urinalysis, Routine w reflex microscopic; Future  Essential hypertension- His  blood pressure is adequately well controlled.  Electrolytes and renal function are normal.  His EKG is negative for LVH or ischemia. -     EKG 12-Lead -     CBC with Differential/Platelet; Future -     Basic metabolic panel; Future -     TSH; Future  Current moderate episode of major depressive disorder without prior episode ([place])- I recommended that he start taking duloxetine.  Will start at 30 mg a day and after 3 to 4 weeks I anticipate increasing to 60 mg a day. -     DULoxetine (CYMBALTA) 30 MG capsule; Take 1 capsule (30 mg total) by mouth daily.  Need for influenza vaccination -     Flu vaccine HIGH DOSE PF (Fluzone High dose)  Routine general medical examination at a health care facility  Need for pneumococcal vaccination -     Pneumococcal conjugate vaccine 13-valent  Herpes simplex infection of penis -     valACYclovir (VALTREX) 1000 MG tablet; Take 1 tablet (1,000 mg total) by mouth 2 (two) times daily for 7 days.  Numbness and tingling of both lower extremities- his B12 level is not low.  His symptoms are probably related to the lumbar spine issues. -     Vitamin B12; Future -     Folate; Future   I am having Delante Karapetyan. Eiland start on DULoxetine and valACYclovir. I am also having him maintain his hydrochlorothiazide, lisinopril, ALPRAZolam, traMADol, and carisoprodol.  Meds ordered this encounter  Medications  . DULoxetine (CYMBALTA) 30 MG capsule    Sig: Take 1 capsule (30 mg total) by mouth daily.    Dispense:  30 capsule    Refill:  0  . valACYclovir (VALTREX) 1000 MG tablet    Sig: Take 1 tablet (1,000 mg total) by mouth 2 (two) times daily for 7 days.    Dispense:  14 tablet    Refill:  0     Follow-up: No follow-ups on file.  [name], MD

## 2018-11-18 ENCOUNTER — Encounter: Payer: Self-pay | Admitting: Internal Medicine

## 2018-11-18 NOTE — Patient Instructions (Signed)

## 2018-11-21 ENCOUNTER — Ambulatory Visit: Payer: Self-pay

## 2018-11-21 NOTE — Telephone Encounter (Signed)
Pt. Called to report possible reaction to 2 new medications.  Started Duloxetine and ValAcyclovir on 11/17/18.  Took last dose of Duloxetine 3/12 AM, and last dose ValAcyclovir 3/12 PM.  Reported started having nausea during night; had 2 diarrhea stools, and broke out in hives.  Reported hives are generalized on genitals, back, scalp, legs, arms, and cheeks.  Denied any swelling of tongue or airway, or shortness of breath.  Pt. Stated "I don't think this is a big deal, I just want to know if I should take Benadryl; I'm not going to take anymore of the medication."  Questioned pt. of any fever; stated he had hot and cold sweats during the night.  Advised will make an appt. For further eval.  Stated "I prefer to see Dr. Ronnald Ramp."  Next avail. Appt. For Dr. Ronnald Ramp is 11/25/18.  The pt. Stated he preferred to wait and see Dr. Ronnald Ramp.  Questioned about the status of penile lesions; reported they are improved, but there has been some swelling in genitals due to the hives.  Pt. Given appt. 3/17 with PCP.  Advised to call office if sx's worsen.  Advised to seek Emergency care if any swelling of tongue or throat, or shortness of breath.  Verb. Understanding.   Will forward Triage note to Provider.       Reason for Disposition . Caller has NON-URGENT medication question about med that PCP prescribed and triager unable to answer question    Pt. started Duloxetine and ValAcyclovir on 11/17/18.  Reported breaking out in hives, and onset of nausea last evening; vomited x 1 last night.  Stated no swelling of tongue or airway.  Denied SOB.  Answer Assessment - Initial Assessment Questions 1. SYMPTOMS: "Do you have any symptoms?"     Diarrhea, vomiting, hives; hot/ cold sweats  2. SEVERITY: If symptoms are present, ask "Are they mild, moderate or severe?"    Nausea; vomited x 1; diarrhea x 2; hives in genitals, back, scalp, legs, arms, cheeks.  Protocols used: MEDICATION QUESTION CALL-A-AH

## 2018-11-21 NOTE — Telephone Encounter (Signed)
Wanted to send this as an FYI.   Per pt "Reported hives are generalized on genitals, back, scalp, legs, arms, and cheeks.  Denied any swelling of tongue or airway, or shortness of breath.  Pt. Stated "I don't think this is a big deal, I just want to know if I should take Benadryl"  Pt has an appt 11/25/2018 with PCP.

## 2018-11-25 ENCOUNTER — Other Ambulatory Visit: Payer: Self-pay

## 2018-11-25 ENCOUNTER — Ambulatory Visit (INDEPENDENT_AMBULATORY_CARE_PROVIDER_SITE_OTHER): Payer: Medicare Other | Admitting: Internal Medicine

## 2018-11-25 ENCOUNTER — Encounter: Payer: Self-pay | Admitting: Internal Medicine

## 2018-11-25 VITALS — BP 136/80 | HR 69 | Temp 98.1°F | Resp 16 | Ht 69.75 in | Wt 160.0 lb

## 2018-11-25 DIAGNOSIS — A6001 Herpesviral infection of penis: Secondary | ICD-10-CM | POA: Diagnosis not present

## 2018-11-25 MED ORDER — FAMCICLOVIR 250 MG PO TABS: 250.0000 mg | ORAL_TABLET | Freq: Two times a day (BID) | ORAL | 2 refills | Status: AC

## 2018-11-25 NOTE — Progress Notes (Signed)
Subjective:  Patient ID: Henry [name], male    DOB: 08-13-1956  Age: 63 y.o. MRN: [id number]  CC: Rash   HPI Henry [name] presents for f/up - Last week he developed a rash with redness, swelling, and itching that started after taking duloxetine and valacyclovir.  He tells me he does not think he needs an antidepressant.  He continues to have painful blisters on his penis and wants to try different antiviral medication.  Outpatient Medications Prior to Visit  Medication Sig Dispense Refill   ALPRAZolam (XANAX) 1 MG tablet Take 1 mg by mouth at bedtime as needed for anxiety.     carisoprodol (SOMA) 350 MG tablet Take 350 mg by mouth 4 (four) times daily as needed for muscle spasms.     hydrochlorothiazide (HYDRODIURIL) 25 MG tablet Take 1 tablet (25 mg total) by mouth daily. 90 tablet 2   lisinopril (PRINIVIL,ZESTRIL) 40 MG tablet Take 1 tablet (40 mg total) by mouth daily. 30 tablet 1   traMADol (ULTRAM) 50 MG tablet Take by mouth every 6 (six) hours as needed.     DULoxetine (CYMBALTA) 30 MG capsule Take 1 capsule (30 mg total) by mouth daily. (Patient not taking: Reported on 11/25/2018) 30 capsule 0   No facility-administered medications prior to visit.     ROS Review of Systems  Constitutional: Negative.  Negative for chills and fever.  HENT: Negative.   Respiratory: Negative for cough, chest tightness, shortness of breath and wheezing.   Cardiovascular: Negative for chest pain, palpitations and leg swelling.  Gastrointestinal: Negative for abdominal pain, diarrhea, nausea and vomiting.  Genitourinary: Positive for genital sores. Negative for difficulty urinating, discharge and hematuria.  Musculoskeletal: Negative.   Skin: Negative for color change, pallor and rash.  Neurological: Negative.  Negative for dizziness and weakness.  Hematological: Negative for adenopathy. Does not bruise/bleed easily.  Psychiatric/Behavioral: Negative.     Objective:  BP 136/80 (BP  Location: Left Arm, Patient Position: Sitting, Cuff Size: Normal)    Pulse 69    Temp 98.1 F (36.7 C) (Oral)    Resp 16    Ht 5' 9.75" (1.772 m)    Wt 160 lb (72.6 kg)    SpO2 96%    BMI 23.12 kg/m   BP Readings from Last 3 Encounters:  11/25/18 136/80  05/21/18 130/82  05/02/18 (!) 160/110    Wt Readings from Last 3 Encounters:  11/25/18 160 lb (72.6 kg)  11/17/18 164 lb 4 oz (74.5 kg)  05/21/18 164 lb (74.4 kg)    Physical Exam Vitals signs reviewed.  HENT:     Nose: Nose normal. No congestion.     Mouth/Throat:     Mouth: Mucous membranes are moist.     Pharynx: Oropharynx is clear. No oropharyngeal exudate.  Eyes:     General: No scleral icterus.    Conjunctiva/sclera: Conjunctivae normal.  Neck:     Musculoskeletal: Normal range of motion. No neck rigidity.  Cardiovascular:     Rate and Rhythm: Normal rate and regular rhythm.     Heart sounds: No murmur.  Pulmonary:     Effort: Pulmonary effort is normal.     Breath sounds: No stridor. No wheezing, rhonchi or rales.  Abdominal:     General: Bowel sounds are normal.     Palpations: There is no mass.     Tenderness: There is no abdominal tenderness.  Musculoskeletal: Normal range of motion.        General:  No swelling.     Right lower leg: No edema.     Left lower leg: No edema.  Lymphadenopathy:     Cervical: No cervical adenopathy.  Skin:    General: Skin is warm and dry.  Neurological:     General: No focal deficit present.     Mental Status: He is oriented to person, place, and time. Mental status is at baseline.  Psychiatric:        Mood and Affect: Mood normal.        Behavior: Behavior normal.        Thought Content: Thought content normal.     Lab Results  Component Value Date   WBC 7.1 11/17/2018   HGB 14.4 11/17/2018   HCT 41.0 11/17/2018   PLT 286.0 11/17/2018   GLUCOSE 97 11/17/2018   CHOL 139 05/02/2018   TRIG 66.0 05/02/2018   HDL 63.60 05/02/2018   LDLCALC 62 05/02/2018   ALT 12  05/02/2018   AST 14 05/02/2018   NA 138 11/17/2018   K 3.8 11/17/2018   CL 101 11/17/2018   CREATININE 0.78 11/17/2018   BUN 19 11/17/2018   CO2 28 11/17/2018   TSH 1.17 11/17/2018   PSA 0.56 11/17/2018    Dg Lumbar Spine Complete  Result Date: 11/17/2018 CLINICAL DATA:  Chronic low back pain with recent progression. Bilateral leg pain and numbness. Scoliosis. EXAM: LUMBAR SPINE - COMPLETE 4+ VIEW COMPARISON:  None. FINDINGS: 5 non rib-bearing lumbar type vertebral bodies are present. The lowest fully formed vertebral body is L5. Levoconvex curvature of the lumbar spine is centered at L3-4. There is left lateral listhesis at L4-5. There is retrolisthesis at L2-3 and L3-4. Asymmetric endplate changes are noted on the right at L3-4 and L4-5. IMPRESSION: 1. 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 2. Significant listhesis at L2-3, L3-4, and L4-5. 3. Asymmetric endplate changes on the right at L3-4 and L4-5. Electronically Signed   By: San Morelle M.D.   On: 11/17/2018 17:08    Assessment & Plan:   Aiden was seen today for rash.  Diagnoses and all orders for this visit:  Herpes simplex infection of penis- I think the allergic reaction was to the valacyclovir.  He continues to have symptomatic herpes blisters so I have asked him to try a course of famciclovir. -     famciclovir (FAMVIR) 250 MG tablet; Take 1 tablet (250 mg total) by mouth 2 (two) times daily for 5 days.   I have discontinued [name]. Labrum's DULoxetine. I am also having him start on famciclovir. Additionally, I am having him maintain his hydrochlorothiazide, lisinopril, ALPRAZolam, traMADol, and carisoprodol.  Meds ordered this encounter  Medications   famciclovir (FAMVIR) 250 MG tablet    Sig: Take 1 tablet (250 mg total) by mouth 2 (two) times daily for 5 days.    Dispense:  10 tablet    Refill:  2     Follow-up: No follow-ups on file.  Henry Calico, MD

## 2018-11-26 ENCOUNTER — Encounter: Payer: Self-pay | Admitting: Internal Medicine

## 2018-11-26 NOTE — Patient Instructions (Signed)
Genital Herpes °Genital herpes is a common sexually transmitted infection (STI) that is caused by a virus. The virus spreads from person to person through sexual contact. Infection can cause itching, blisters, and sores around the genitals or rectum. Symptoms may last several days and then go away This is called an outbreak. However, the virus remains in your body, so you may have more outbreaks in the future. The time between outbreaks varies and can be months or years. °Genital herpes affects men and women. It is particularly concerning for pregnant women because the virus can be passed to the baby during delivery and can cause serious problems. Genital herpes is also a concern for people who have a weak disease-fighting (immune) system. °What are the causes? °This condition is caused by the herpes simplex virus (HSV) type 1 or type 2. The virus may spread through: °· Sexual contact with an infected person, including vaginal, anal, and oral sex. °· Contact with fluid from a herpes sore. °· The skin. This means that you can get herpes from an infected partner even if he or she does not have a visible sore or does not know that he or she is infected. °What increases the risk? °You are more likely to develop this condition if: °· You have sex with many partners. °· You do not use latex condoms during sex. °What are the signs or symptoms? °Most people do not have symptoms (asymptomatic) or have mild symptoms that may be mistaken for other skin problems. Symptoms may include: °· Small red bumps near the genitals, rectum, or mouth. These bumps turn into blisters and then turn into sores. °· Flu-like symptoms, including: °? Fever. °? Body aches. °? Swollen lymph nodes. °? Headache. °· Painful urination. °· Pain and itching in the genital area or rectal area. °· Vaginal discharge. °· Tingling or shooting pain in the legs and buttocks. °Generally, symptoms are more severe and last longer during the first (primary)  outbreak. Flu-like symptoms are also more common during the primary outbreak. °How is this diagnosed? °Genital herpes may be diagnosed based on: °· A physical exam. °· Your medical history. °· Blood tests. °· A test of a fluid sample (culture) from an open sore. °How is this treated? °There is no cure for this condition, but treatment with antiviral medicines that are taken by mouth (orally) can do the following: °· Speed up healing and relieve symptoms. °· Help to reduce the spread of the virus to sexual partners. °· Limit the chance of future outbreaks, or make future outbreaks shorter. °· Lessen symptoms of future outbreaks. °Your health care provider may also recommend pain relief medicines, such as aspirin or ibuprofen. °Follow these instructions at home: °Sexual activity °· Do not have sexual contact during active outbreaks. °· Practice safe sex. Latex condoms and male condoms may help prevent the spread of the herpes virus. °General instructions °· Keep the affected areas dry and clean. °· Take over-the-counter and prescription medicines only as told by your health care provider. °· Avoid rubbing or touching blisters and sores. If you do touch blisters or sores: °? Wash your hands thoroughly with soap and water. °? Do not touch your eyes afterward. °· To help relieve pain or itching, you may take the following actions as directed by your health care provider: °? Apply a cold, wet cloth (cold compress) to affected areas 4-6 times a day. °? Apply a substance that protects your skin and reduces bleeding (astringent). °? Apply a gel that   helps relieve pain around sores (lidocaine gel). °? Take a warm, shallow bath that cleans the genital area (sitz bath). °· Keep all follow-up visits as told by your health care provider. This is important. °How is this prevented? °· Use condoms. Although anyone can get genital herpes during sexual contact, even with the use of a condom, a condom can provide some  protection. °· Avoid having multiple sexual partners. °· Talk with your sexual partner about any symptoms either of you may have. Also, talk with your partner about any history of STIs. °· Get tested for STIs before you have sex. Ask your partner to do the same. °· Do not have sexual contact if you have symptoms of genital herpes. °Contact a health care provider if: °· Your symptoms are not improving with medicine. °· Your symptoms return. °· You have new symptoms. °· You have a fever. °· You have abdominal pain. °· You have redness, swelling, or pain in your eye. °· You notice new sores on other parts of your body. °· You are a woman and experience bleeding between menstrual periods. °· You have had herpes and you become pregnant or plan to become pregnant. °Summary °· Genital herpes is a common sexually transmitted infection (STI) that is caused by the herpes simplex virus (HSV) type 1 or type 2. °· These viruses are most often spread through sexual contact with an infected person. °· You are more likely to develop this condition if you have sex with many partners or you have unprotected sex. °· Most people do not have symptoms (asymptomatic) or have mild symptoms that may be mistaken for other skin problems. Symptoms occur as outbreaks that may happen months or years apart. °· There is no cure for this condition, but treatment with oral antiviral medicines can reduce symptoms, reduce the chance of spreading the virus to a partner, prevent future outbreaks, or shorten future outbreaks. °This information is not intended to replace advice given to you by your health care provider. Make sure you discuss any questions you have with your health care provider. °Document Released: 08/24/2000 Document Revised: 07/27/2016 Document Reviewed: 07/27/2016 °Elsevier Interactive Patient Education © 2019 [employer] ° °

## 2018-12-04 DIAGNOSIS — M4120 Other idiopathic scoliosis, site unspecified: Secondary | ICD-10-CM | POA: Diagnosis not present

## 2018-12-04 DIAGNOSIS — M8580 Other specified disorders of bone density and structure, unspecified site: Secondary | ICD-10-CM | POA: Diagnosis not present

## 2018-12-05 ENCOUNTER — Other Ambulatory Visit: Payer: Self-pay | Admitting: Neurosurgery

## 2018-12-05 DIAGNOSIS — M4120 Other idiopathic scoliosis, site unspecified: Secondary | ICD-10-CM

## 2018-12-09 ENCOUNTER — Other Ambulatory Visit: Payer: Self-pay | Admitting: Nurse Practitioner

## 2018-12-09 DIAGNOSIS — I10 Essential (primary) hypertension: Secondary | ICD-10-CM

## 2018-12-10 ENCOUNTER — Other Ambulatory Visit: Payer: Self-pay | Admitting: Neurosurgery

## 2018-12-10 DIAGNOSIS — M4120 Other idiopathic scoliosis, site unspecified: Secondary | ICD-10-CM

## 2019-01-07 ENCOUNTER — Other Ambulatory Visit: Payer: Self-pay | Admitting: Internal Medicine

## 2019-01-07 DIAGNOSIS — I10 Essential (primary) hypertension: Secondary | ICD-10-CM

## 2019-01-31 ENCOUNTER — Other Ambulatory Visit: Payer: Self-pay

## 2019-01-31 ENCOUNTER — Ambulatory Visit
Admission: RE | Admit: 2019-01-31 | Discharge: 2019-01-31 | Disposition: A | Discharge status: Home / Self Care | Payer: Medicare Other | Source: Ambulatory Visit | Attending: Neurosurgery | Admitting: Neurosurgery

## 2019-01-31 DIAGNOSIS — M4120 Other idiopathic scoliosis, site unspecified: Secondary | ICD-10-CM

## 2019-02-06 ENCOUNTER — Other Ambulatory Visit: Payer: Self-pay | Admitting: *Deleted

## 2019-02-06 DIAGNOSIS — I10 Essential (primary) hypertension: Secondary | ICD-10-CM

## 2019-02-06 MED ORDER — HYDROCHLOROTHIAZIDE 25 MG PO TABS: 25.0000 mg | ORAL_TABLET | Freq: Every day | ORAL | 2 refills | Status: DC

## 2019-02-20 ENCOUNTER — Other Ambulatory Visit (HOSPITAL_COMMUNITY): Payer: Self-pay | Admitting: Neurosurgery

## 2019-02-20 DIAGNOSIS — M4120 Other idiopathic scoliosis, site unspecified: Secondary | ICD-10-CM

## 2019-02-26 ENCOUNTER — Other Ambulatory Visit: Payer: Self-pay

## 2019-02-26 ENCOUNTER — Ambulatory Visit
Admission: RE | Admit: 2019-02-26 | Discharge: 2019-02-26 | Disposition: A | Discharge status: Home / Self Care | Payer: Medicare Other | Source: Ambulatory Visit | Attending: Neurosurgery | Admitting: Neurosurgery

## 2019-02-26 DIAGNOSIS — M4120 Other idiopathic scoliosis, site unspecified: Secondary | ICD-10-CM

## 2019-02-26 DIAGNOSIS — M85852 Other specified disorders of bone density and structure, left thigh: Secondary | ICD-10-CM | POA: Diagnosis not present

## 2019-02-26 DIAGNOSIS — M810 Age-related osteoporosis without current pathological fracture: Secondary | ICD-10-CM | POA: Diagnosis not present

## 2019-03-04 ENCOUNTER — Other Ambulatory Visit (HOSPITAL_COMMUNITY): Payer: Self-pay | Admitting: Neurosurgery

## 2019-03-10 ENCOUNTER — Other Ambulatory Visit (HOSPITAL_COMMUNITY): Payer: Self-pay | Admitting: Neurosurgery

## 2019-03-27 ENCOUNTER — Other Ambulatory Visit (HOSPITAL_COMMUNITY)
Admission: RE | Admit: 2019-03-27 | Discharge: 2019-03-27 | Disposition: A | Discharge status: Home / Self Care | Payer: Medicare Other | Source: Ambulatory Visit | Attending: Neurosurgery | Admitting: Neurosurgery

## 2019-03-27 DIAGNOSIS — Z1159 Encounter for screening for other viral diseases: Secondary | ICD-10-CM | POA: Diagnosis not present

## 2019-03-27 DIAGNOSIS — M542 Cervicalgia: Secondary | ICD-10-CM | POA: Diagnosis not present

## 2019-03-27 DIAGNOSIS — M48062 Spinal stenosis, lumbar region with neurogenic claudication: Secondary | ICD-10-CM | POA: Diagnosis not present

## 2019-03-27 DIAGNOSIS — M5412 Radiculopathy, cervical region: Secondary | ICD-10-CM | POA: Diagnosis not present

## 2019-03-27 DIAGNOSIS — M4120 Other idiopathic scoliosis, site unspecified: Secondary | ICD-10-CM | POA: Diagnosis not present

## 2019-03-27 LAB — SARS CORONAVIRUS 2 (TAT 6-24 HRS): SARS Coronavirus 2: NEGATIVE

## 2019-03-28 ENCOUNTER — Ambulatory Visit (HOSPITAL_COMMUNITY): Payer: Medicare Other

## 2019-03-30 ENCOUNTER — Encounter (HOSPITAL_COMMUNITY): Payer: Self-pay | Admitting: *Deleted

## 2019-03-30 ENCOUNTER — Other Ambulatory Visit: Payer: Self-pay

## 2019-03-30 NOTE — Progress Notes (Signed)
Spoke with pt for pre-op call. Pt denies cardiac history or diabetes. Is treated for HTN. PCP is Dr. [name].   Pt had Covid 19 test done 03/27/19 - negative. Pt states he is in self quarantine and understands to continue.   Coronavirus Screening  Have you experienced the following symptoms:  Cough NO Fever (>100.54F)  NO Runny nose NO Sore throat NO Difficulty breathing/shortness of breath  NO  Have you or a family member traveled in the last 14 days and where? NO  Patient reminded that hospital visitation restrictions are in effect and the importance of the restrictions.

## 2019-03-31 ENCOUNTER — Ambulatory Visit (HOSPITAL_COMMUNITY): Payer: Medicare Other | Admitting: Registered Nurse

## 2019-03-31 ENCOUNTER — Ambulatory Visit (HOSPITAL_COMMUNITY)
Admission: RE | Admit: 2019-03-31 | Discharge: 2019-03-31 | Disposition: A | Discharge status: Home / Self Care | Payer: Medicare Other | Source: Ambulatory Visit | Attending: Neurosurgery | Admitting: Neurosurgery

## 2019-03-31 ENCOUNTER — Ambulatory Visit (HOSPITAL_COMMUNITY)
Admission: RE | Admit: 2019-03-31 | Discharge: 2019-03-31 | Disposition: A | Discharge status: Home / Self Care | Payer: Medicare Other | Attending: Neurosurgery | Admitting: Neurosurgery

## 2019-03-31 ENCOUNTER — Encounter (HOSPITAL_COMMUNITY)
Admission: RE | Disposition: A | Discharge status: Home / Self Care | Payer: Self-pay | Source: Home / Self Care | Attending: Neurosurgery

## 2019-03-31 ENCOUNTER — Encounter (HOSPITAL_COMMUNITY): Payer: Self-pay

## 2019-03-31 ENCOUNTER — Other Ambulatory Visit: Payer: Self-pay

## 2019-03-31 DIAGNOSIS — M4120 Other idiopathic scoliosis, site unspecified: Secondary | ICD-10-CM

## 2019-03-31 DIAGNOSIS — M47816 Spondylosis without myelopathy or radiculopathy, lumbar region: Secondary | ICD-10-CM | POA: Diagnosis not present

## 2019-03-31 DIAGNOSIS — M542 Cervicalgia: Secondary | ICD-10-CM | POA: Insufficient documentation

## 2019-03-31 DIAGNOSIS — M5126 Other intervertebral disc displacement, lumbar region: Secondary | ICD-10-CM | POA: Diagnosis not present

## 2019-03-31 DIAGNOSIS — M4316 Spondylolisthesis, lumbar region: Secondary | ICD-10-CM | POA: Diagnosis not present

## 2019-03-31 DIAGNOSIS — M4802 Spinal stenosis, cervical region: Secondary | ICD-10-CM | POA: Diagnosis not present

## 2019-03-31 DIAGNOSIS — M47812 Spondylosis without myelopathy or radiculopathy, cervical region: Secondary | ICD-10-CM | POA: Insufficient documentation

## 2019-03-31 DIAGNOSIS — M4182 Other forms of scoliosis, cervical region: Secondary | ICD-10-CM | POA: Diagnosis not present

## 2019-03-31 DIAGNOSIS — I10 Essential (primary) hypertension: Secondary | ICD-10-CM | POA: Diagnosis not present

## 2019-03-31 HISTORY — DX: Pneumonia, unspecified organism: J18.9

## 2019-03-31 HISTORY — PX: RADIOLOGY WITH ANESTHESIA: SHX6223

## 2019-03-31 HISTORY — DX: Anemia, unspecified: D64.9

## 2019-03-31 HISTORY — DX: Scoliosis, unspecified: M41.9

## 2019-03-31 LAB — COMPREHENSIVE METABOLIC PANEL
ALT: 15 U/L (ref 0–44)
AST: 19 U/L (ref 15–41)
Albumin: 3.7 g/dL (ref 3.5–5.0)
Alkaline Phosphatase: 54 U/L (ref 38–126)
Anion gap: 7 (ref 5–15)
BUN: 22 mg/dL (ref 8–23)
CO2: 24 mmol/L (ref 22–32)
Calcium: 10.2 mg/dL (ref 8.9–10.3)
Chloride: 107 mmol/L (ref 98–111)
Creatinine, Ser: 0.9 mg/dL (ref 0.61–1.24)
GFR calc Af Amer: >60 mL/min (ref >60)
GFR calc non Af Amer: >60 mL/min (ref >60)
Glucose, Bld: 100 mg/dL — ABNORMAL HIGH (ref 70–99)
Potassium: 4 mmol/L (ref 3.5–5.1)
Sodium: 138 mmol/L (ref 135–145)
Total Bilirubin: 0.4 mg/dL (ref 0.3–1.2)
Total Protein: 6.3 g/dL — ABNORMAL LOW (ref 6.5–8.1)

## 2019-03-31 LAB — CBC
HCT: 41.7 % (ref 39.0–52.0)
Hemoglobin: 13.9 g/dL (ref 13.0–17.0)
MCH: 31.2 pg (ref 26.0–34.0)
MCHC: 33.3 g/dL (ref 30.0–36.0)
MCV: 93.7 fL (ref 80.0–100.0)
Platelets: 237 10*3/uL (ref 150–400)
RBC: 4.45 MIL/uL (ref 4.22–5.81)
RDW: 12.2 % (ref 11.5–15.5)
WBC: 6.4 10*3/uL (ref 4.0–10.5)
nRBC: 0 % (ref 0.0–0.2)

## 2019-03-31 SURGERY — MRI WITH ANESTHESIA
Anesthesia: General

## 2019-03-31 MED ORDER — PROPOFOL 10 MG/ML IV BOLUS
INTRAVENOUS | Status: DC | PRN
  Administered 2019-03-31: 200 mg via INTRAVENOUS

## 2019-03-31 MED ORDER — LACTATED RINGERS IV SOLN
INTRAVENOUS | Status: DC | PRN
  Administered 2019-03-31: 10:00:00 via INTRAVENOUS

## 2019-03-31 MED ORDER — FENTANYL CITRATE (PF) 100 MCG/2ML IJ SOLN: 25.0000 ug | INTRAMUSCULAR | Status: DC | PRN

## 2019-03-31 MED ORDER — GLYCOPYRROLATE PF 0.2 MG/ML IJ SOSY
PREFILLED_SYRINGE | INTRAMUSCULAR | Status: DC | PRN
  Administered 2019-03-31: .2 mg via INTRAVENOUS

## 2019-03-31 MED ORDER — DEXAMETHASONE SODIUM PHOSPHATE 10 MG/ML IJ SOLN
INTRAMUSCULAR | Status: DC | PRN
  Administered 2019-03-31: 10 mg via INTRAVENOUS

## 2019-03-31 MED ORDER — SODIUM CHLORIDE 0.9 % IV SOLN
INTRAVENOUS | Status: DC | PRN
  Administered 2019-03-31: 10:00:00 25 ug/min via INTRAVENOUS

## 2019-03-31 MED ORDER — OXYCODONE HCL 5 MG PO TABS: 5.0000 mg | ORAL_TABLET | Freq: Once | ORAL | Status: DC | PRN

## 2019-03-31 MED ORDER — SUGAMMADEX SODIUM 200 MG/2ML IV SOLN
INTRAVENOUS | Status: DC | PRN
  Administered 2019-03-31: 200 mg via INTRAVENOUS

## 2019-03-31 MED ORDER — OXYCODONE HCL 5 MG/5ML PO SOLN: 5.0000 mg | Freq: Once | ORAL | Status: DC | PRN

## 2019-03-31 MED ORDER — FENTANYL CITRATE (PF) 100 MCG/2ML IJ SOLN
INTRAMUSCULAR | Status: DC | PRN
  Administered 2019-03-31: 50 ug via INTRAVENOUS

## 2019-03-31 MED ORDER — ONDANSETRON HCL 4 MG/2ML IJ SOLN
INTRAMUSCULAR | Status: DC | PRN
  Administered 2019-03-31: 4 mg via INTRAVENOUS

## 2019-03-31 MED ORDER — MIDAZOLAM HCL 2 MG/2ML IJ SOLN
INTRAMUSCULAR | Status: DC | PRN
  Administered 2019-03-31: 2 mg via INTRAVENOUS

## 2019-03-31 MED ORDER — LIDOCAINE HCL (CARDIAC) PF 100 MG/5ML IV SOSY
PREFILLED_SYRINGE | INTRAVENOUS | Status: DC | PRN
  Administered 2019-03-31: 60 mg via INTRATRACHEAL

## 2019-03-31 MED ORDER — LACTATED RINGERS IV SOLN
INTRAVENOUS | Status: DC
  Administered 2019-03-31: 07:00:00 via INTRAVENOUS

## 2019-03-31 MED ORDER — ONDANSETRON HCL 4 MG/2ML IJ SOLN: 4.0000 mg | Freq: Once | INTRAMUSCULAR | Status: DC | PRN

## 2019-03-31 MED ORDER — ARTIFICIAL TEARS OPHTHALMIC OINT
TOPICAL_OINTMENT | OPHTHALMIC | Status: DC | PRN
  Administered 2019-03-31: 1 via OPHTHALMIC

## 2019-03-31 MED ORDER — ROCURONIUM BROMIDE 100 MG/10ML IV SOLN
INTRAVENOUS | Status: DC | PRN
  Administered 2019-03-31: 60 mg via INTRAVENOUS

## 2019-03-31 NOTE — Discharge Instructions (Signed)

## 2019-03-31 NOTE — Anesthesia Preprocedure Evaluation (Signed)
Anesthesia Evaluation  Patient identified by MRN, date of birth, ID band Patient awake    Reviewed: Allergy & Precautions, NPO status , Patient's Chart, lab work & pertinent test results  Airway Mallampati: II  TM Distance: >3 FB Neck ROM: Full    Dental  (+) Teeth Intact, Dental Advisory Given   Pulmonary former smoker,    breath sounds clear to auscultation       Cardiovascular hypertension,  Rhythm:Regular Rate:Normal     Neuro/Psych    GI/Hepatic   Endo/Other    Renal/GU      Musculoskeletal   Abdominal   Peds  Hematology   Anesthesia Other Findings   Reproductive/Obstetrics                             Anesthesia Physical Anesthesia Plan  ASA: II  Anesthesia Plan: General   Post-op Pain Management:    Induction: Intravenous  PONV Risk Score and Plan: Ondansetron and Dexamethasone  Airway Management Planned: Oral ETT  Additional Equipment:   Intra-op Plan:   Post-operative Plan: Extubation in OR  Informed Consent: I have reviewed the patients History and Physical, chart, labs and discussed the procedure including the risks, benefits and alternatives for the proposed anesthesia with the patient or authorized representative who has indicated his/her understanding and acceptance.     Dental advisory given  Plan Discussed with: Anesthesiologist and CRNA  Anesthesia Plan Comments:         Anesthesia Quick Evaluation

## 2019-03-31 NOTE — Anesthesia Postprocedure Evaluation (Signed)
Anesthesia Post Note  Patient: Henry [name]  Procedure(s) Performed: MRI WITH ANESTHESIA LUMBAR WITHOUT CONTRAST AND CERVICAL WITHOUT CONTRAST (N/A )     Patient location during evaluation: PACU Anesthesia Type: General Level of consciousness: awake and alert Pain management: pain level controlled Vital Signs Assessment: post-procedure vital signs reviewed and stable Respiratory status: spontaneous breathing, nonlabored ventilation, respiratory function stable and patient connected to nasal cannula oxygen Cardiovascular status: blood pressure returned to baseline and stable Postop Assessment: no apparent nausea or vomiting Anesthetic complications: no    Last Vitals:  Vitals:   03/31/19 0930 03/31/19 0939  BP: 122/85 127/84  Pulse:  63  Resp:  17  Temp: 36.4 C   SpO2:  97%    Last Pain:  Vitals:   03/31/19 0930  TempSrc:   PainSc: 0-No pain                 Elisha Mcgruder COKER

## 2019-03-31 NOTE — Anesthesia Procedure Notes (Signed)
Procedure Name: Intubation Date/Time: 03/31/2019 8:05 AM Performed by: Jearld Pies, CRNA Pre-anesthesia Checklist: Patient identified, Emergency Drugs available, Suction available and Patient being monitored Patient Re-evaluated:Patient Re-evaluated prior to induction Oxygen Delivery Method: Circle System Utilized Preoxygenation: Pre-oxygenation with 100% oxygen Induction Type: IV induction Ventilation: Mask ventilation without difficulty and Oral airway inserted - appropriate to patient size Laryngoscope Size: Mac and 4 Grade View: Grade II Tube type: Oral Tube size: 7.5 mm Number of attempts: 1 Airway Equipment and Method: Stylet and Oral airway Placement Confirmation: ETT inserted through vocal cords under direct vision,  positive ETCO2 and breath sounds checked- equal and bilateral Secured at: 23 cm Tube secured with: Tape Dental Injury: Teeth and Oropharynx as per pre-operative assessment

## 2019-03-31 NOTE — Transfer of Care (Signed)
Immediate Anesthesia Transfer of Care Note  Patient: Henry [name]  Procedure(s) Performed: MRI WITH ANESTHESIA LUMBAR WITHOUT CONTRAST AND CERVICAL WITHOUT CONTRAST (N/A )  Patient Location: PACU  Anesthesia Type:General  Level of Consciousness: awake, alert  and oriented  Airway & Oxygen Therapy: Patient Spontanous Breathing and Patient connected to face mask oxygen  Post-op Assessment: Report given to RN and Post -op Vital signs reviewed and stable  Post vital signs: Reviewed and stable  Last Vitals:  Vitals Value Taken Time  BP 122/78   Temp 97   Pulse 68 03/31/19 0928  Resp 18 03/31/19 0928  SpO2 98 % 03/31/19 0928  Vitals shown include unvalidated device data.  Last Pain:  Vitals:   03/31/19 0657  TempSrc:   PainSc: 1      Report to [facility].    Complications: No apparent anesthesia complications

## 2019-04-01 ENCOUNTER — Encounter (HOSPITAL_COMMUNITY): Payer: Self-pay | Admitting: Radiology

## 2019-04-08 NOTE — Addendum Note (Signed)
Addendum  created 04/08/19 1550 by Roberts Gaudy, MD   Intraprocedure Staff edited

## 2019-04-09 ENCOUNTER — Other Ambulatory Visit: Payer: Self-pay | Admitting: Neurosurgery

## 2019-04-09 DIAGNOSIS — M542 Cervicalgia: Secondary | ICD-10-CM | POA: Diagnosis not present

## 2019-04-10 ENCOUNTER — Other Ambulatory Visit: Payer: Self-pay

## 2019-04-10 ENCOUNTER — Encounter (HOSPITAL_COMMUNITY): Payer: Self-pay | Admitting: *Deleted

## 2019-04-10 ENCOUNTER — Other Ambulatory Visit (HOSPITAL_COMMUNITY)
Admission: RE | Admit: 2019-04-10 | Discharge: 2019-04-10 | Disposition: A | Discharge status: Home / Self Care | Payer: Medicare Other | Source: Ambulatory Visit | Attending: Neurosurgery | Admitting: Neurosurgery

## 2019-04-10 DIAGNOSIS — Z01812 Encounter for preprocedural laboratory examination: Secondary | ICD-10-CM | POA: Diagnosis not present

## 2019-04-10 DIAGNOSIS — Z20828 Contact with and (suspected) exposure to other viral communicable diseases: Secondary | ICD-10-CM | POA: Diagnosis not present

## 2019-04-10 LAB — SARS CORONAVIRUS 2 (TAT 6-24 HRS): SARS Coronavirus 2: NEGATIVE

## 2019-04-13 ENCOUNTER — Encounter (HOSPITAL_COMMUNITY)
Admission: RE | Disposition: A | Discharge status: Home / Self Care | Payer: Self-pay | Source: Home / Self Care | Attending: Neurosurgery

## 2019-04-13 ENCOUNTER — Inpatient Hospital Stay (HOSPITAL_COMMUNITY): Payer: Medicare Other | Admitting: Anesthesiology

## 2019-04-13 ENCOUNTER — Inpatient Hospital Stay (HOSPITAL_COMMUNITY): Payer: Medicare Other

## 2019-04-13 ENCOUNTER — Encounter (HOSPITAL_COMMUNITY): Payer: Self-pay

## 2019-04-13 ENCOUNTER — Observation Stay (HOSPITAL_COMMUNITY)
Admission: RE | Admit: 2019-04-13 | Discharge: 2019-04-14 | Disposition: A | Discharge status: Home / Self Care | Payer: Medicare Other | Attending: Neurosurgery | Admitting: Neurosurgery

## 2019-04-13 ENCOUNTER — Other Ambulatory Visit: Payer: Self-pay

## 2019-04-13 DIAGNOSIS — I10 Essential (primary) hypertension: Secondary | ICD-10-CM | POA: Insufficient documentation

## 2019-04-13 DIAGNOSIS — M4802 Spinal stenosis, cervical region: Secondary | ICD-10-CM | POA: Diagnosis not present

## 2019-04-13 DIAGNOSIS — M2578 Osteophyte, vertebrae: Secondary | ICD-10-CM | POA: Insufficient documentation

## 2019-04-13 DIAGNOSIS — M4722 Other spondylosis with radiculopathy, cervical region: Principal | ICD-10-CM | POA: Insufficient documentation

## 2019-04-13 DIAGNOSIS — Z79899 Other long term (current) drug therapy: Secondary | ICD-10-CM | POA: Insufficient documentation

## 2019-04-13 DIAGNOSIS — Z419 Encounter for procedure for purposes other than remedying health state, unspecified: Secondary | ICD-10-CM

## 2019-04-13 DIAGNOSIS — Z87891 Personal history of nicotine dependence: Secondary | ICD-10-CM | POA: Diagnosis not present

## 2019-04-13 DIAGNOSIS — M40202 Unspecified kyphosis, cervical region: Secondary | ICD-10-CM | POA: Diagnosis not present

## 2019-04-13 DIAGNOSIS — M4712 Other spondylosis with myelopathy, cervical region: Secondary | ICD-10-CM | POA: Insufficient documentation

## 2019-04-13 DIAGNOSIS — F329 Major depressive disorder, single episode, unspecified: Secondary | ICD-10-CM | POA: Diagnosis not present

## 2019-04-13 DIAGNOSIS — Z981 Arthrodesis status: Secondary | ICD-10-CM | POA: Diagnosis not present

## 2019-04-13 HISTORY — DX: Unspecified osteoarthritis, unspecified site: M19.90

## 2019-04-13 HISTORY — PX: ANTERIOR CERVICAL DECOMP/DISCECTOMY FUSION: SHX1161

## 2019-04-13 LAB — HEMOGLOBIN A1C
Hgb A1c MFr Bld: 5 % (ref 4.8–5.6)
Mean Plasma Glucose: 96.8 mg/dL

## 2019-04-13 SURGERY — ANTERIOR CERVICAL DECOMPRESSION/DISCECTOMY FUSION 3 LEVELS
Anesthesia: General | Site: Spine Cervical

## 2019-04-13 MED ORDER — MENTHOL 3 MG MT LOZG: 1.0000 | LOZENGE | OROMUCOSAL | Status: DC | PRN

## 2019-04-13 MED ORDER — THROMBIN 20000 UNITS EX SOLR
CUTANEOUS | Status: AC
  Filled 2019-04-13: qty 20000

## 2019-04-13 MED ORDER — FENTANYL CITRATE (PF) 100 MCG/2ML IJ SOLN
25.0000 ug | INTRAMUSCULAR | Status: DC | PRN
  Administered 2019-04-13 (×2): 25 ug via INTRAVENOUS
  Administered 2019-04-13: 50 ug via INTRAVENOUS

## 2019-04-13 MED ORDER — CYCLOBENZAPRINE HCL 10 MG PO TABS
10.0000 mg | ORAL_TABLET | Freq: Three times a day (TID) | ORAL | Status: DC | PRN
  Administered 2019-04-13: 10 mg via ORAL

## 2019-04-13 MED ORDER — HYDROMORPHONE HCL 1 MG/ML IJ SOLN: 1.0000 mg | INTRAMUSCULAR | Status: DC | PRN

## 2019-04-13 MED ORDER — 0.9 % SODIUM CHLORIDE (POUR BTL) OPTIME
TOPICAL | Status: DC | PRN
  Administered 2019-04-13: 12:00:00 1000 mL

## 2019-04-13 MED ORDER — EPHEDRINE 5 MG/ML INJ
INTRAVENOUS | Status: AC
  Filled 2019-04-13: qty 20

## 2019-04-13 MED ORDER — PHENOL 1.4 % MT LIQD: 1.0000 | OROMUCOSAL | Status: DC | PRN

## 2019-04-13 MED ORDER — OXYCODONE HCL 5 MG PO TABS
10.0000 mg | ORAL_TABLET | ORAL | Status: DC | PRN
  Administered 2019-04-13 – 2019-04-14 (×4): 10 mg via ORAL
  Filled 2019-04-13 (×3): qty 2

## 2019-04-13 MED ORDER — CEFAZOLIN SODIUM 1 G IJ SOLR
INTRAMUSCULAR | Status: AC
  Filled 2019-04-13: qty 20

## 2019-04-13 MED ORDER — ONDANSETRON HCL 4 MG/2ML IJ SOLN
INTRAMUSCULAR | Status: DC | PRN
  Administered 2019-04-13: 4 mg via INTRAVENOUS

## 2019-04-13 MED ORDER — OXYCODONE HCL 5 MG PO TABS
ORAL_TABLET | ORAL | Status: AC
  Filled 2019-04-13: qty 2

## 2019-04-13 MED ORDER — FENTANYL CITRATE (PF) 100 MCG/2ML IJ SOLN
INTRAMUSCULAR | Status: DC | PRN
  Administered 2019-04-13: 50 ug via INTRAVENOUS
  Administered 2019-04-13: 100 ug via INTRAVENOUS
  Administered 2019-04-13 (×2): 50 ug via INTRAVENOUS

## 2019-04-13 MED ORDER — ACETAMINOPHEN 500 MG PO TABS
1000.0000 mg | ORAL_TABLET | Freq: Once | ORAL | Status: AC
  Administered 2019-04-13: 1000 mg via ORAL
  Filled 2019-04-13: qty 2

## 2019-04-13 MED ORDER — PROPOFOL 10 MG/ML IV BOLUS
INTRAVENOUS | Status: AC
  Filled 2019-04-13: qty 20

## 2019-04-13 MED ORDER — SODIUM CHLORIDE (PF) 0.9 % IJ SOLN
INTRAMUSCULAR | Status: AC
  Filled 2019-04-13: qty 10

## 2019-04-13 MED ORDER — ACETAMINOPHEN 650 MG RE SUPP: 650.0000 mg | RECTAL | Status: DC | PRN

## 2019-04-13 MED ORDER — EPHEDRINE SULFATE-NACL 50-0.9 MG/10ML-% IV SOSY
PREFILLED_SYRINGE | INTRAVENOUS | Status: DC | PRN
  Administered 2019-04-13: 10 mg via INTRAVENOUS

## 2019-04-13 MED ORDER — SODIUM CHLORIDE 0.9% FLUSH: 3.0000 mL | INTRAVENOUS | Status: DC | PRN

## 2019-04-13 MED ORDER — SODIUM CHLORIDE 0.9% FLUSH
3.0000 mL | Freq: Two times a day (BID) | INTRAVENOUS | Status: DC
  Administered 2019-04-13: 3 mL via INTRAVENOUS

## 2019-04-13 MED ORDER — ONDANSETRON HCL 4 MG/2ML IJ SOLN
INTRAMUSCULAR | Status: AC
  Filled 2019-04-13: qty 2

## 2019-04-13 MED ORDER — CYCLOBENZAPRINE HCL 10 MG PO TABS
ORAL_TABLET | ORAL | Status: AC
  Filled 2019-04-13: qty 1

## 2019-04-13 MED ORDER — CEFAZOLIN SODIUM-DEXTROSE 2-3 GM-%(50ML) IV SOLR
INTRAVENOUS | Status: DC | PRN
  Administered 2019-04-13: 2 g via INTRAVENOUS

## 2019-04-13 MED ORDER — PANTOPRAZOLE SODIUM 40 MG PO TBEC
40.0000 mg | DELAYED_RELEASE_TABLET | Freq: Every day | ORAL | Status: DC
  Administered 2019-04-13: 40 mg via ORAL
  Filled 2019-04-13: qty 1

## 2019-04-13 MED ORDER — DEXAMETHASONE SODIUM PHOSPHATE 10 MG/ML IJ SOLN
INTRAMUSCULAR | Status: AC
  Filled 2019-04-13: qty 1

## 2019-04-13 MED ORDER — DEXAMETHASONE SODIUM PHOSPHATE 10 MG/ML IJ SOLN
INTRAMUSCULAR | Status: DC | PRN
  Administered 2019-04-13: 10 mg via INTRAVENOUS

## 2019-04-13 MED ORDER — ONDANSETRON HCL 4 MG/2ML IJ SOLN: 4.0000 mg | Freq: Four times a day (QID) | INTRAMUSCULAR | Status: DC | PRN

## 2019-04-13 MED ORDER — ONDANSETRON HCL 4 MG PO TABS: 4.0000 mg | ORAL_TABLET | Freq: Four times a day (QID) | ORAL | Status: DC | PRN

## 2019-04-13 MED ORDER — THROMBIN 20000 UNITS EX SOLR
CUTANEOUS | Status: DC | PRN
  Administered 2019-04-13: 5 mL via TOPICAL

## 2019-04-13 MED ORDER — SUGAMMADEX SODIUM 200 MG/2ML IV SOLN
INTRAVENOUS | Status: DC | PRN
  Administered 2019-04-13: 200 mg via INTRAVENOUS

## 2019-04-13 MED ORDER — SODIUM CHLORIDE 0.9 % IV SOLN: 250.0000 mL | INTRAVENOUS | Status: DC

## 2019-04-13 MED ORDER — THROMBIN 5000 UNITS EX SOLR
OROMUCOSAL | Status: DC | PRN
  Administered 2019-04-13: 13:00:00 5 mL via TOPICAL

## 2019-04-13 MED ORDER — PANTOPRAZOLE SODIUM 40 MG IV SOLR: 40.0000 mg | Freq: Every day | INTRAVENOUS | Status: DC

## 2019-04-13 MED ORDER — SODIUM CHLORIDE 0.9 % IV SOLN
INTRAVENOUS | Status: DC | PRN
  Administered 2019-04-13: 11:00:00 40 ug/min via INTRAVENOUS

## 2019-04-13 MED ORDER — HYDROCHLOROTHIAZIDE 25 MG PO TABS
25.0000 mg | ORAL_TABLET | Freq: Every day | ORAL | Status: DC
  Administered 2019-04-13 – 2019-04-14 (×2): 25 mg via ORAL
  Filled 2019-04-13 (×2): qty 1

## 2019-04-13 MED ORDER — ROCURONIUM BROMIDE 10 MG/ML (PF) SYRINGE
PREFILLED_SYRINGE | INTRAVENOUS | Status: DC | PRN
  Administered 2019-04-13: 20 mg via INTRAVENOUS
  Administered 2019-04-13: 70 mg via INTRAVENOUS
  Administered 2019-04-13: 20 mg via INTRAVENOUS

## 2019-04-13 MED ORDER — LIDOCAINE 2% (20 MG/ML) 5 ML SYRINGE
INTRAMUSCULAR | Status: AC
  Filled 2019-04-13: qty 5

## 2019-04-13 MED ORDER — MIDAZOLAM HCL 5 MG/5ML IJ SOLN
INTRAMUSCULAR | Status: DC | PRN
  Administered 2019-04-13: 2 mg via INTRAVENOUS

## 2019-04-13 MED ORDER — ACETAMINOPHEN 325 MG PO TABS: 650.0000 mg | ORAL_TABLET | ORAL | Status: DC | PRN

## 2019-04-13 MED ORDER — PROPOFOL 10 MG/ML IV BOLUS
INTRAVENOUS | Status: DC | PRN
  Administered 2019-04-13: 180 mg via INTRAVENOUS

## 2019-04-13 MED ORDER — ROCURONIUM BROMIDE 10 MG/ML (PF) SYRINGE
PREFILLED_SYRINGE | INTRAVENOUS | Status: AC
  Filled 2019-04-13: qty 10

## 2019-04-13 MED ORDER — GLYCOPYRROLATE PF 0.2 MG/ML IJ SOSY
PREFILLED_SYRINGE | INTRAMUSCULAR | Status: DC | PRN
  Administered 2019-04-13: .1 mg via INTRAVENOUS

## 2019-04-13 MED ORDER — LIDOCAINE 2% (20 MG/ML) 5 ML SYRINGE
INTRAMUSCULAR | Status: DC | PRN
  Administered 2019-04-13: 100 mg via INTRAVENOUS

## 2019-04-13 MED ORDER — CEFAZOLIN SODIUM-DEXTROSE 2-4 GM/100ML-% IV SOLN
2.0000 g | Freq: Three times a day (TID) | INTRAVENOUS | Status: AC
  Administered 2019-04-13 – 2019-04-14 (×2): 2 g via INTRAVENOUS
  Filled 2019-04-13 (×2): qty 100

## 2019-04-13 MED ORDER — PROMETHAZINE HCL 25 MG/ML IJ SOLN: 6.2500 mg | INTRAMUSCULAR | Status: DC | PRN

## 2019-04-13 MED ORDER — ALUM & MAG HYDROXIDE-SIMETH 200-200-20 MG/5ML PO SUSP: 30.0000 mL | Freq: Four times a day (QID) | ORAL | Status: DC | PRN

## 2019-04-13 MED ORDER — THROMBIN 5000 UNITS EX SOLR
CUTANEOUS | Status: AC
  Filled 2019-04-13: qty 5000

## 2019-04-13 MED ORDER — LISINOPRIL 40 MG PO TABS
40.0000 mg | ORAL_TABLET | Freq: Every day | ORAL | Status: DC
  Administered 2019-04-13 – 2019-04-14 (×2): 40 mg via ORAL
  Filled 2019-04-13 (×2): qty 1

## 2019-04-13 MED ORDER — FENTANYL CITRATE (PF) 250 MCG/5ML IJ SOLN
INTRAMUSCULAR | Status: AC
  Filled 2019-04-13: qty 5

## 2019-04-13 MED ORDER — LACTATED RINGERS IV SOLN
INTRAVENOUS | Status: DC
  Administered 2019-04-13 (×2): via INTRAVENOUS

## 2019-04-13 MED ORDER — MIDAZOLAM HCL 2 MG/2ML IJ SOLN
INTRAMUSCULAR | Status: AC
  Filled 2019-04-13: qty 2

## 2019-04-13 MED ORDER — SODIUM CHLORIDE 0.9 % IV SOLN
INTRAVENOUS | Status: DC | PRN
  Administered 2019-04-13: 12:00:00 500 mL

## 2019-04-13 MED ORDER — FENTANYL CITRATE (PF) 100 MCG/2ML IJ SOLN
INTRAMUSCULAR | Status: AC
  Filled 2019-04-13: qty 2

## 2019-04-13 SURGICAL SUPPLY — 71 items
ADH SKN CLS APL DERMABOND .7 (GAUZE/BANDAGES/DRESSINGS) ×1
APL SKNCLS STERI-STRIP NONHPOA (GAUZE/BANDAGES/DRESSINGS) ×1
BAG DECANTER FOR FLEXI CONT (MISCELLANEOUS) ×2 IMPLANT
BASKET BONE COLLECTION (BASKET) ×2 IMPLANT
BENZOIN TINCTURE PRP APPL 2/3 (GAUZE/BANDAGES/DRESSINGS) ×2 IMPLANT
BIT DRILL 13 (BIT) ×1 IMPLANT
BIT DRILL NEURO 2X3.1 SFT TUCH (MISCELLANEOUS) ×1 IMPLANT
BUR MATCHSTICK NEURO 3.0 LAGG (BURR) ×2 IMPLANT
CANISTER SUCT 3000ML PPV (MISCELLANEOUS) ×2 IMPLANT
CARTRIDGE OIL MAESTRO DRILL (MISCELLANEOUS) ×1 IMPLANT
COVER WAND RF STERILE (DRAPES) ×2 IMPLANT
DERMABOND ADVANCED (GAUZE/BANDAGES/DRESSINGS) ×1
DERMABOND ADVANCED .7 DNX12 (GAUZE/BANDAGES/DRESSINGS) ×1 IMPLANT
DIFFUSER DRILL AIR PNEUMATIC (MISCELLANEOUS) ×2 IMPLANT
DRAIN SNY 7 FPER (WOUND CARE) ×1 IMPLANT
DRAPE C-ARM 42X72 X-RAY (DRAPES) ×4 IMPLANT
DRAPE LAPAROTOMY 100X72 PEDS (DRAPES) ×2 IMPLANT
DRAPE MICROSCOPE LEICA (MISCELLANEOUS) ×2 IMPLANT
DRILL NEURO 2X3.1 SOFT TOUCH (MISCELLANEOUS) ×2
DRSG OPSITE POSTOP 4X6 (GAUZE/BANDAGES/DRESSINGS) ×1 IMPLANT
DURAPREP 6ML APPLICATOR 50/CS (WOUND CARE) ×2 IMPLANT
ELECT COATED BLADE 2.86 ST (ELECTRODE) ×2 IMPLANT
ELECT REM PT RETURN 9FT ADLT (ELECTROSURGICAL) ×2
ELECTRODE REM PT RTRN 9FT ADLT (ELECTROSURGICAL) ×1 IMPLANT
EVACUATOR SILICONE 100CC (DRAIN) ×1 IMPLANT
GAUZE 4X4 16PLY RFD (DISPOSABLE) IMPLANT
GAUZE SPONGE 4X4 12PLY STRL (GAUZE/BANDAGES/DRESSINGS) ×2 IMPLANT
GLOVE BIO SURGEON STRL SZ7 (GLOVE) ×2 IMPLANT
GLOVE BIO SURGEON STRL SZ8 (GLOVE) ×2 IMPLANT
GLOVE BIOGEL PI IND STRL 7.0 (GLOVE) IMPLANT
GLOVE BIOGEL PI IND STRL 7.5 (GLOVE) IMPLANT
GLOVE BIOGEL PI IND STRL 8 (GLOVE) IMPLANT
GLOVE BIOGEL PI INDICATOR 7.0 (GLOVE)
GLOVE BIOGEL PI INDICATOR 7.5 (GLOVE) ×1
GLOVE BIOGEL PI INDICATOR 8 (GLOVE) ×4
GLOVE ECLIPSE 7.5 STRL STRAW (GLOVE) ×4 IMPLANT
GLOVE EXAM NITRILE XL STR (GLOVE) IMPLANT
GLOVE INDICATOR 8.5 STRL (GLOVE) ×2 IMPLANT
GOWN STRL REUS W/ TWL LRG LVL3 (GOWN DISPOSABLE) IMPLANT
GOWN STRL REUS W/ TWL XL LVL3 (GOWN DISPOSABLE) ×1 IMPLANT
GOWN STRL REUS W/TWL 2XL LVL3 (GOWN DISPOSABLE) ×2 IMPLANT
GOWN STRL REUS W/TWL LRG LVL3 (GOWN DISPOSABLE) ×4
GOWN STRL REUS W/TWL XL LVL3 (GOWN DISPOSABLE) ×2
HALTER HD/CHIN CERV TRACTION D (MISCELLANEOUS) ×2 IMPLANT
HEMOSTAT POWDER KIT SURGIFOAM (HEMOSTASIS) ×2 IMPLANT
KIT BASIN OR (CUSTOM PROCEDURE TRAY) ×2 IMPLANT
KIT TURNOVER KIT B (KITS) ×2 IMPLANT
NDL SPNL 20GX3.5 QUINCKE YW (NEEDLE) ×1 IMPLANT
NEEDLE SPNL 20GX3.5 QUINCKE YW (NEEDLE) ×2 IMPLANT
NS IRRIG 1000ML POUR BTL (IV SOLUTION) ×2 IMPLANT
OIL CARTRIDGE MAESTRO DRILL (MISCELLANEOUS) ×2
PACK LAMINECTOMY NEURO (CUSTOM PROCEDURE TRAY) ×2 IMPLANT
PIN DISTRACTION 14MM (PIN) IMPLANT
PLATE 3 55XLCK NS SPNE CVD (Plate) IMPLANT
PLATE 3 ATLANTIS TRANS (Plate) ×2 IMPLANT
PUTTY BONE DBX 2.5 MIS (Bone Implant) ×1 IMPLANT
RUBBERBAND STERILE (MISCELLANEOUS) ×4 IMPLANT
SCREW ST 15X4XST FXANG NS (Screw) IMPLANT
SCREW ST FIX 4 ATL (Screw) ×14 IMPLANT
SCREW ST FIX 4 ATL 3120213 (Screw) ×1 IMPLANT
SPACER ACDF TPS LG PARALLEL 7 (Spacer) ×1 IMPLANT
SPACER COLONIAL ACDF TPS LG S6 (Spacer) ×2 IMPLANT
SPONGE INTESTINAL PEANUT (DISPOSABLE) ×2 IMPLANT
SPONGE SURGIFOAM ABS GEL 100 (HEMOSTASIS) ×2 IMPLANT
STRIP CLOSURE SKIN 1/2X4 (GAUZE/BANDAGES/DRESSINGS) ×2 IMPLANT
SUT VIC AB 3-0 SH 8-18 (SUTURE) ×2 IMPLANT
SUT VIC AB 4-0 PS2 27 (SUTURE) ×2 IMPLANT
TAPE CLOTH 4X10 WHT NS (GAUZE/BANDAGES/DRESSINGS) ×1 IMPLANT
TOWEL GREEN STERILE (TOWEL DISPOSABLE) ×2 IMPLANT
TOWEL GREEN STERILE FF (TOWEL DISPOSABLE) ×2 IMPLANT
WATER STERILE IRR 1000ML POUR (IV SOLUTION) ×2 IMPLANT

## 2019-04-13 NOTE — Anesthesia Postprocedure Evaluation (Signed)
Anesthesia Post Note  Patient: Henry [name]  Procedure(s) Performed: ANTERIOR CERVICAL DECOMPRESSION/DISCECTOMY FUSION CERVICAL FOUR-FIVE, CERVICAL FIVE-SIX, CERVICAL SIX-SEVEN (N/A Spine Cervical)     Patient location during evaluation: PACU Anesthesia Type: General Level of consciousness: sedated Pain management: pain level controlled Vital Signs Assessment: post-procedure vital signs reviewed and stable Respiratory status: spontaneous breathing and respiratory function stable Cardiovascular status: stable Postop Assessment: no apparent nausea or vomiting Anesthetic complications: no    Last Vitals:  Vitals:   04/13/19 1517 04/13/19 1519  BP:  116/81  Pulse: 86 87  Resp: 16 (!) 21  Temp:  (!) 36.4 C  SpO2: 96% 97%    Last Pain:  Vitals:   04/13/19 1517  TempSrc:   PainSc: 4     LLE Motor Response: Purposeful movement;Responds to commands (04/13/19 1517) LLE Sensation: Full sensation (04/13/19 1517) RLE Motor Response: Purposeful movement;Responds to commands (04/13/19 1517) RLE Sensation: Full sensation (04/13/19 1517)      [place]

## 2019-04-13 NOTE — H&P (Signed)
Henry [name] is an 63 y.o. male.   Chief Complaint: Neck pain HPI: 63 year old gentleman with longstanding neck pain bilateral shoulder pain numbness tingling in his hands.  Work-up revealed severe cervical spondylosis C4-5 C5-6 C6-7 with cord compression and foraminal stenosis at all 3 levels.  Due to patient progression of clinical syndrome imaging findings and failed conservative treatment I recommended ACDF at all 3 levels.  I extensively reviewed the risks and benefits of the operation with the patient as well as perioperative course expectations of outcome and alternatives of surgery and he understood and agreed to proceed forward.  Past Medical History:  Diagnosis Date  . Allergy   . Anemia   . Arthritis   . Depression    due to long tern hepatitis C - once the Hep C was cured, had not any issues until last year 12/07/2017) when is wife died  . History of hepatitis C    has been treated in 12-08-2003  . Hypertension   . Pneumonia    as a teenager  . Scoliosis     Past Surgical History:  Procedure Laterality Date  . COLONOSCOPY    . RADIOLOGY WITH ANESTHESIA N/A 03/31/2019   Procedure: MRI WITH ANESTHESIA LUMBAR WITHOUT CONTRAST AND CERVICAL WITHOUT CONTRAST;  Surgeon: Radiologist, Medication, MD;  Location: [place];  Service: Radiology;  Laterality: N/A;  . TONSILLECTOMY AND ADENOIDECTOMY      Family History  Problem Relation Age of Onset  . Alcoholism Brother    Social History:  reports that he has quit smoking. He quit after 24.00 years of use. He has never used smokeless tobacco. He reports current alcohol use of about 35.0 standard drinks of alcohol per week. He reports current drug use. Drug: Marijuana.  Allergies:  Allergies  Allergen Reactions  . Valacyclovir Rash    Medications Prior to Admission  Medication Sig Dispense Refill  . hydrochlorothiazide (HYDRODIURIL) 25 MG tablet Take 1 tablet (25 mg total) by mouth daily. 90 tablet 2  . ibuprofen (ADVIL) 200 MG tablet Take  400 mg by mouth every 4 (four) hours as needed for moderate pain.    Marland Kitchen lisinopril (ZESTRIL) 40 MG tablet TAKE 1 TABLET BY MOUTH EVERY DAY (Patient taking differently: Take 40 mg by mouth daily. ) 90 tablet 1    No results found for this or any previous visit (from the past 48 hour(s)). No results found.  Review of Systems  Musculoskeletal: Positive for neck pain.  Neurological: Positive for tingling and sensory change.    Blood pressure (!) 150/88, pulse 78, temperature 98.3 F (36.8 C), temperature source Oral, resp. rate 20, height 5\' 10"  (1.778 m), weight 72.6 kg, SpO2 97 %. Physical Exam  Constitutional: He appears well-developed.  HENT:  Head: Normocephalic.  Eyes: Pupils are equal, round, and reactive to light.  Neck: Normal range of motion.  GI: Soft.  Neurological: He is alert. He has normal strength. GCS eye subscore is 4. GCS verbal subscore is 5. GCS motor subscore is 6.  Strength 5-5 deltoid, bicep, tricep, wrist flexion, wrist extension, hand intrinsics.  Skin: Skin is warm and dry.     Assessment/Plan 63 year old presents for anterior cervical discectomies and fusion at C4-5 C5-6 C6-7.  Lisaanne Lawrie P, MD 04/13/2019, 9:44 AM

## 2019-04-13 NOTE — Anesthesia Preprocedure Evaluation (Signed)
Anesthesia Evaluation  Patient identified by MRN, date of birth, ID band Patient awake    Reviewed: Allergy & Precautions, NPO status , Patient's Chart, lab work & pertinent test results  Airway Mallampati: II  TM Distance: >3 FB Neck ROM: Full    Dental  (+) Teeth Intact, Dental Advisory Given   Pulmonary former smoker,    breath sounds clear to auscultation       Cardiovascular hypertension, Pt. on medications  Rhythm:Regular Rate:Normal     Neuro/Psych PSYCHIATRIC DISORDERS Depression    GI/Hepatic negative GI ROS, Neg liver ROS,   Endo/Other  negative endocrine ROS  Renal/GU negative Renal ROS     Musculoskeletal   Abdominal   Peds  Hematology   Anesthesia Other Findings   Reproductive/Obstetrics                             Anesthesia Physical  Anesthesia Plan  ASA: II  Anesthesia Plan: General   Post-op Pain Management:    Induction: Intravenous  PONV Risk Score and Plan: 3 and Ondansetron, Dexamethasone and Midazolam  Airway Management Planned: Oral ETT  Additional Equipment:   Intra-op Plan:   Post-operative Plan: Extubation in OR  Informed Consent: I have reviewed the patients History and Physical, chart, labs and discussed the procedure including the risks, benefits and alternatives for the proposed anesthesia with the patient or authorized representative who has indicated his/her understanding and acceptance.     Dental advisory given  Plan Discussed with: Anesthesiologist and CRNA  Anesthesia Plan Comments:         Anesthesia Quick Evaluation

## 2019-04-13 NOTE — Transfer of Care (Signed)
Immediate Anesthesia Transfer of Care Note  Patient: Henry [name]  Procedure(s) Performed: ANTERIOR CERVICAL DECOMPRESSION/DISCECTOMY FUSION CERVICAL FOUR-FIVE, CERVICAL FIVE-SIX, CERVICAL SIX-SEVEN (N/A Spine Cervical)  Patient Location: PACU  Anesthesia Type:General  Level of Consciousness: awake, alert  and oriented  Airway & Oxygen Therapy: Patient Spontanous Breathing and Patient connected to nasal cannula oxygen  Post-op Assessment: Report given to RN, Post -op Vital signs reviewed and stable and Patient moving all extremities X 4  Post vital signs: Reviewed and stable  Last Vitals:  Vitals Value Taken Time  BP 114/95 04/13/19 1417  Temp 36.8 C 04/13/19 1418  Pulse 85 04/13/19 1426  Resp 46 04/13/19 1426  SpO2 94 % 04/13/19 1426  Vitals shown include unvalidated device data.  Last Pain:  Vitals:   04/13/19 0947  TempSrc:   PainSc: 3       Patients Stated Pain Goal: 2 (07/10/58 4585)  Complications: No apparent anesthesia complications

## 2019-04-13 NOTE — Op Note (Signed)
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  Rating down his C5-C6 and C7 nerve root pattern work-up revealed severe cervical spondylosis stenosis kyphosis and cord compression at C4-5 C5-6 and C6-7.  Due to patient's failed conservative treatment imaging findings and progression of clinical syndrome I recommended anterior cervical discectomies and fusion at those 3 levels.  I extensively reviewed the risks and benefits of the operation with the patient as well as perioperative course expectations of outcome and alternatives of surgery and he understood and agreed to proceed forward.  Operative procedure: Patient brought into the OR was induced under general anesthesia positioned supine the neck in slight extension 5 pounds halter traction.  Preoperative x-ray localized the appropriate level so a curvilinear incision was made just off the midline to the anterior border of the sternocleidomastoid and the superficial layer of the platysma was dissected out divided longitudinally.  The avascular plane between the sternocleidomastoid and strap muscles was developed down to the previous fascia and prevertebral fascia was dissected exposing the spaces which the corrected spaces were confirmed by intraoperative x-ray.  There was extensive overgrowth of osteophytes anteriorly especially at C4-5 but also at C5-6 C6-7 these were all but no way with a Leksell rongeur recontouring the anterior part  of the vertebral bodies and identified the 3 displaces.  All 3 disc space were drilled down capturing the bone shavings and mucus trap.  Then under microscope illumination first working at C4-5 the space was further drilled down aggressive undermining both endplates removed large central spur marching laterally identified both C5 pedicles and both C5 nerve roots were skeletonized flush the pedicle.  At the end of discectomy there is no further stenosis either centrally or foraminally this was packed with Gelfoam.  Attention was then taken at C5-6.  C5-6 also had very large posterior osteophytes they were all aggressively under Bitton decompressing central canal and both C6 nerve roots were identified and both C6 foramina were opened up.  Then this was also packed with Gelfoam intention can C6-7 there was some soft disc material here causing severe cord compression this was all removed partially calcified guide posterior longitudinal ligament was also removed aggressive undermining of both endplates decompress the central canal identified both C7 pedicles in both C7 nerve roots were skeletonized decompressed flush with the pedicle.  After all 3 disc spaces had been decompressed I selected to 6 mm implants for C4-5 and C5-6 and a 7 mm for C6-7 the locally harvested autograft mix was then packed with a DBX inside the cages and in the cages were inserted.  I then selected a 55 mm Atlantis translational plate and utilizing 073 mm fixed angle screws and 113 the plate was attached all screws had excellent purchase and locking mechanism was engaged.  Postop fluoroscopy confirmed good position of all the implants.  The meticulous hemostasis was maintained was copiously irrigated some additional bone graft was packed laterally to the cages and underneath the plate.  And drain was placed and the wound was closed in layers with interrupted Vicryl and a running 4-0  subcuticular Dermabond benzoin Steri-Strips and a sterile  dressing was applied patient recovery room in stable condition.  At the end the case on needle count sponge counts were correct.

## 2019-04-13 NOTE — Anesthesia Procedure Notes (Signed)
Procedure Name: Intubation Date/Time: 04/13/2019 11:04 AM Performed by: Kyung Rudd, CRNA Pre-anesthesia Checklist: Patient identified, Emergency Drugs available, Suction available, Patient being monitored and Timeout performed Patient Re-evaluated:Patient Re-evaluated prior to induction Oxygen Delivery Method: Circle system utilized Preoxygenation: Pre-oxygenation with 100% oxygen Induction Type: IV induction Ventilation: Mask ventilation without difficulty Laryngoscope Size: Mac and 3 Grade View: Grade I Tube type: Oral Tube size: 7.5 mm Number of attempts: 1 Airway Equipment and Method: Stylet Placement Confirmation: ETT inserted through vocal cords under direct vision,  positive ETCO2 and breath sounds checked- equal and bilateral Secured at: 20 cm Tube secured with: Tape Dental Injury: Teeth and Oropharynx as per pre-operative assessment

## 2019-04-14 DIAGNOSIS — M4722 Other spondylosis with radiculopathy, cervical region: Secondary | ICD-10-CM | POA: Diagnosis not present

## 2019-04-14 DIAGNOSIS — M4802 Spinal stenosis, cervical region: Secondary | ICD-10-CM | POA: Diagnosis not present

## 2019-04-14 DIAGNOSIS — M4712 Other spondylosis with myelopathy, cervical region: Secondary | ICD-10-CM | POA: Diagnosis not present

## 2019-04-14 DIAGNOSIS — I10 Essential (primary) hypertension: Secondary | ICD-10-CM | POA: Diagnosis not present

## 2019-04-14 DIAGNOSIS — M40202 Unspecified kyphosis, cervical region: Secondary | ICD-10-CM | POA: Diagnosis not present

## 2019-04-14 DIAGNOSIS — M2578 Osteophyte, vertebrae: Secondary | ICD-10-CM | POA: Diagnosis not present

## 2019-04-14 MED ORDER — OXYCODONE-ACETAMINOPHEN 5-325 MG PO TABS: 1.0000 | ORAL_TABLET | ORAL | 0 refills | Status: DC | PRN

## 2019-04-14 MED ORDER — METHOCARBAMOL 500 MG PO TABS: 500.0000 mg | ORAL_TABLET | Freq: Four times a day (QID) | ORAL | 0 refills | Status: DC

## 2019-04-14 NOTE — Evaluation (Signed)
Physical Therapy Evaluation and Discharge Patient Details Name: Henry [name] MRN: [id number] DOB: [birth year] Today's Date: 04/14/2019   History of Present Illness  Pt is a 63 y/o male who presents s/p C4-C7 ACDF on 04/13/2019. PMH significant for scoliosis, HTN, Hep C (treated), depression.  Clinical Impression  Patient evaluated by Physical Therapy with no further acute PT needs identified. All education has been completed and the patient has no further questions. At the time of PT eval pt was able to perform transfers and ambulation with gross modified independence. Pt was educated on precautions, activity progression, car transfer, and general safety with mobility around the home. Pt is a road cyclist and anxious to resume exercise. Recommended no exercise outside of walking until MD approves. See below for any follow-up Physical Therapy or equipment needs. PT is signing off. Thank you for this referral.     Follow Up Recommendations No PT follow up;Supervision - Intermittent    Equipment Recommendations  None recommended by PT    Recommendations for Other Services       Precautions / Restrictions Precautions Precautions: Fall;Cervical Precaution Booklet Issued: Yes (comment) Precaution Comments: Reviewed handout in detail and pt was cued for precautions during functional mobility.  Required Braces or Orthoses: Cervical Brace Cervical Brace: Soft collar Restrictions Weight Bearing Restrictions: No      Mobility  Bed Mobility               General bed mobility comments: Pt sitting up EOB when PT arrived. Verbally reviewed log roll technique.   Transfers Overall transfer level: Modified independent Equipment used: None Transfers: Sit to/from Stand           General transfer comment: Pt demonstrated good hand placement on seated surface for safety. VC's for improved posture on ascent.   Ambulation/Gait Ambulation/Gait assistance: Modified independent (Device/Increase  time) Gait Distance (Feet): 400 Feet Assistive device: None Gait Pattern/deviations: Step-through pattern;Decreased stride length Gait velocity: Slightly decreased Gait velocity interpretation: 1.31 - 2.62 ft/sec, indicative of limited community ambulator General Gait Details: VC's for improved posture at times but overall ambulating well.   Stairs Stairs: (Pt declined stair training)          Wheelchair Mobility    Modified Rankin (Stroke Patients Only)       Balance Overall balance assessment: Mild deficits observed, not formally tested                                           Pertinent Vitals/Pain Pain Assessment: Faces Faces Pain Scale: Hurts little more Pain Location: Incision site Pain Descriptors / Indicators: Operative site guarding;Discomfort Pain Intervention(s): Limited activity within patient's tolerance;Monitored during session;Repositioned    Home Living Family/patient expects to be discharged to:: Private residence Living Arrangements: Non-relatives/Friends Available Help at Discharge: Friend(s);Available 24 hours/day Type of Home: House Home Access: Stairs to enter Entrance Stairs-Rails: None Entrance Stairs-Number of Steps: 3-4 Home Layout: One level Home Equipment: Walker - 2 wheels;Cane - single point      Prior Function Level of Independence: Independent         Comments: Pt is a road cyclist     Hand Dominance        Extremity/Trunk Assessment   Upper Extremity Assessment Upper Extremity Assessment: Defer to OT evaluation    Lower Extremity Assessment Lower Extremity Assessment: Overall WFL for tasks assessed  Cervical / Trunk Assessment Cervical / Trunk Assessment: Other exceptions Cervical / Trunk Exceptions: s/p surgery  Communication   Communication: No difficulties  Cognition Arousal/Alertness: Awake/alert Behavior During Therapy: WFL for tasks assessed/performed Overall Cognitive Status: Within  Functional Limits for tasks assessed                                        General Comments      Exercises     Assessment/Plan    PT Assessment Patent does not need any further PT services  PT Problem List         PT Treatment Interventions      PT Goals (Current goals can be found in the Care Plan section)  Acute Rehab PT Goals Patient Stated Goal: Home this morning PT Goal Formulation: All assessment and education complete, DC therapy    Frequency     Barriers to discharge        Co-evaluation               AM-PAC PT "6 Clicks" Mobility  Outcome Measure Help needed turning from your back to your side while in a flat bed without using bedrails?: None Help needed moving from lying on your back to sitting on the side of a flat bed without using bedrails?: None Help needed moving to and from a bed to a chair (including a wheelchair)?: None Help needed standing up from a chair using your arms (e.g., wheelchair or bedside chair)?: None Help needed to walk in hospital room?: None Help needed climbing 3-5 steps with a railing? : A Little 6 Click Score: 23    End of Session Equipment Utilized During Treatment: Cervical collar Activity Tolerance: Patient tolerated treatment well Patient left: in chair;with call bell/phone within reach Nurse Communication: Mobility status PT Visit Diagnosis: Other symptoms and signs involving the nervous system (R29.898);Pain Pain - part of body: (Incision - neck)    Time: 2876-8115 PT Time Calculation (min) (ACUTE ONLY): 15 min   Charges:   PT Evaluation $PT Eval Low Complexity: 1 Low          Henry [name], PT, DPT Acute Rehabilitation Services Pager: [phone] Office: [phone]   Henry [name] 04/14/2019, 9:02 AM

## 2019-04-14 NOTE — Evaluation (Signed)
Occupational Therapy Evaluation Patient Details Name: Henry [name] MRN: [id number] DOB: 1956-07-08 Today's Date: 04/14/2019    History of Present Illness Pt is a 63 y/o male who presents s/p C4-C7 ACDF on 04/13/2019. PMH significant for scoliosis, HTN, Hep C (treated), depression.   Clinical Impression   Patient evaluated by Occupational Therapy with no further acute OT needs identified. All education has been completed and the patient has no further questions. See below for any follow-up Occupational Therapy or equipment needs. OT to sign off. Thank you for referral.      Follow Up Recommendations  No OT follow up    Equipment Recommendations  None recommended by OT    Recommendations for Other Services       Precautions / Restrictions Precautions Precautions: Fall;Cervical Precaution Booklet Issued: Yes (comment) Precaution Comments: reviewed back precautions for adls Required Braces or Orthoses: Cervical Brace Cervical Brace: Soft collar Restrictions Weight Bearing Restrictions: No      Mobility Bed Mobility               General bed mobility comments: OOB in chair on arrival  Transfers Overall transfer level: Modified independent Equipment used: None Transfers: Sit to/from Stand           General transfer comment: Pt demonstrated good hand placement on seated surface for safety. VC's for improved posture on ascent.     Balance Overall balance assessment: Mild deficits observed, not formally tested                                         ADL either performed or assessed with clinical judgement   ADL Overall ADL's : Independent                                       General ADL Comments: demonstrates don doff of brace, educated on bathing and dressing , able to figure 4 cross for LB. pt has network of friends to (A) upon d/c      Vision Baseline Vision/History: No visual deficits       Perception     Praxis       Pertinent Vitals/Pain Pain Assessment: Faces Faces Pain Scale: Hurts a little bit Pain Location: Incision site Pain Descriptors / Indicators: Operative site guarding;Discomfort Pain Intervention(s): Repositioned     Hand Dominance Right   Extremity/Trunk Assessment Upper Extremity Assessment Upper Extremity Assessment: LUE deficits/detail LUE Deficits / Details: reports feels weaker than R side. pt states only because you ask can i notice a tiny bit of numbness all over my arm. pt reports all numbness to the 4th and 5th digit are gone.  LUE Coordination: WNL   Lower Extremity Assessment Lower Extremity Assessment: Defer to PT evaluation   Cervical / Trunk Assessment Cervical / Trunk Assessment: Other exceptions Cervical / Trunk Exceptions: s/p surgery   Communication Communication Communication: No difficulties   Cognition Arousal/Alertness: Awake/alert Behavior During Therapy: WFL for tasks assessed/performed Overall Cognitive Status: Within Functional Limits for tasks assessed                                     General Comments  dressing is dry and intact.     Exercises  Shoulder Instructions      Home Living Family/patient expects to be discharged to:: Private residence Living Arrangements: Non-relatives/Friends Available Help at Discharge: Friend(s);Available 24 hours/day Type of Home: House Home Access: Stairs to enter [employer] of Steps: 3-4 Entrance Stairs-Rails: None Home Layout: One level     Bathroom Shower/Tub: Tub/shower unit         Home Equipment: Environmental consultant - 2 wheels;Cane - single point   Additional Comments: pt states he plans to use an outside yard plastic chair in shower . pt was educated on the risk and no advised. pt agreeable to shower chair and requesting the cost for delivery of chair in hospital.       Prior Functioning/Environment Level of Independence: Independent        Comments: Pt is a  road cyclist        OT Problem List: Impaired balance (sitting and/or standing)      OT Treatment/Interventions:      OT Goals(Current goals can be found in the care plan section) Acute Rehab OT Goals Patient Stated Goal: Home this morning  OT Frequency:     Barriers to D/C:            Co-evaluation              AM-PAC OT "6 Clicks" Daily Activity     Outcome Measure Help from another person eating meals?: None Help from another person taking care of personal grooming?: None Help from another person toileting, which includes using toliet, bedpan, or urinal?: None Help from another person bathing (including washing, rinsing, drying)?: None Help from another person to put on and taking off regular upper body clothing?: None Help from another person to put on and taking off regular lower body clothing?: None 6 Click Score: 24   End of Session Equipment Utilized During Treatment: Cervical collar Nurse Communication: Mobility status;Precautions  Activity Tolerance: Patient tolerated treatment well Patient left: in chair;with call bell/phone within reach                   Time: 0923-3007 OT Time Calculation (min): 22 min Charges:  OT General Charges $OT Visit: 1 Visit OT Evaluation $OT Eval Moderate Complexity: 1 Mod   Jeri Modena, OTR/L  Acute Rehabilitation Services Pager: [phone] Office: [phone] .   Jeri Modena 04/14/2019, 10:46 AM

## 2019-04-14 NOTE — Care Management CC44 (Signed)
Condition Code 44 Documentation Completed  Patient Details  Name: JULIAN MEDINA MRN: [id number] Date of Birth: 31-Oct-1955   Condition Code 44 given:  Yes Patient signature on Condition Code 44 notice:  Yes Documentation of 2 MD's agreement:  Yes Code 44 added to claim:  Yes    Sharin Mons, RN 04/14/2019, 10:59 AM

## 2019-04-14 NOTE — Discharge Summary (Signed)
Physician Discharge Summary  Patient ID: Henry [name] MRN: [id number] DOB/AGE: Dec 16, 1955 62 y.o.  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 The patient tolerated the procedure well and was taken to the recovery room and then to the floor in stable condition. The hospital course was routine. There were no complications. The wound remained clean dry and intact. Pt had appropriate neck soreness. No complaints of arm pain or new N/T/W. The patient remained afebrile with stable vital signs, and tolerated a regular diet. The patient continued to increase activities, and pain was well controlled with oral pain medications.   Consults: None  Significant Diagnostic Studies:  Results for orders placed or performed during the hospital encounter of 04/13/19  Hemoglobin A1c  Result Value Ref Range   Hgb A1c MFr Bld 5.0 4.8 - 5.6 %   Mean Plasma Glucose 96.8 mg/dL    Dg Cervical Spine 1 View  Result Date: 04/13/2019 CLINICAL DATA:  ACDF C4 through C7 EXAM: DG CERVICAL SPINE - 1 VIEW; DG C-ARM 61-120 MIN COMPARISON:  Cervical spine MRI 03/31/2019 FINDINGS: Lateral C-arm room.  Images were obtained in the operating room. Advanced cervical spondylosis. New she will image demonstrates needle localization of the C5-6 interspace with anterior approach. Additional images reveal anterior discectomy and interbody fusion C4 through C7. Anterior plate and screws in good position. Interbody bone plugs in good position. Sponge in the anterior soft tissues. IMPRESSION: Satisfactory ACDF C4 through C7. Electronically Signed   By: Franchot Gallo M.D.   On: 04/13/2019 14:07   Mr Cervical Spine Wo Contrast  Result Date:  03/31/2019 CLINICAL DATA:  Headache, neck pain, left arm and leg pain. Symptoms are chronic. EXAM: MRI CERVICAL SPINE WITHOUT CONTRAST TECHNIQUE: Multiplanar, multisequence MR imaging of the cervical spine was performed. No intravenous contrast was administered. COMPARISON:  None. FINDINGS: Alignment: There is mild reversal of cervical lordosis with associated trace retrolisthesis C5 on C6 and C6 on C7. Vertebrae: No fracture, evidence of discitis, or bone lesion. Degenerative endplate signal change is seen at C4-5, C5-6 and C6-7. Cord: Normal signal throughout. Posterior Fossa, vertebral arteries, paraspinal tissues: Negative. Disc levels: C2-3: Moderate facet arthropathy on the left. There is some uncovertebral spurring on the left. The central canal and right foramen are open. Mild left foraminal narrowing noted. C3-4: Moderate to severe facet arthropathy is much worse on the right. There is a shallow disc bulge, endplate spur and some uncovertebral disease. Ligamentum flavum thickening noted. Mild central canal stenosis. Severe right and moderate left foraminal narrowing. C4-5: Ligamentum flavum thickening, disc bulge with endplate spur, uncovertebral disease and left much worse than right facet arthropathy. There is flattening of the ventral cord. Severe left and moderately severe right foraminal narrowing. C5-6: Diffuse disc osteophyte complex, ligamentum flavum thickening and bulky uncovertebral disease. There is flattening of the cord and severe bilateral foraminal narrowing. C6-7: Loss of disc space height, disc osteophyte complex and uncovertebral spurring. The ventral thecal sac is effaced. Severe bilateral foraminal narrowing is present. C7-T1: Moderate right facet arthropathy. Shallow disc bulge. No stenosis. IMPRESSION: Spondylosis appears worst at C5-6 where there is flattening of the cord and severe bilateral foraminal narrowing. Severe right and moderate left foraminal narrowing at C3-4. There is  mild central canal stenosis at this level. Mild flattening of the ventral cord with severe left and moderately severe right foraminal narrowing at C4-5. Severe bilateral foraminal narrowing at C6-7 due to uncovertebral disease. The ventral thecal sac is effaced at this level. Electronically Signed   By: Inge Rise M.D.   On: 03/31/2019 13:28   Mr Lumbar Spine Wo Contrast  Result Date: 03/31/2019 CLINICAL DATA:  Scoliosis.  Back pain.  Left leg pain. EXAM: MRI LUMBAR SPINE WITHOUT CONTRAST TECHNIQUE: Multiplanar, multisequence MR imaging of the lumbar spine was performed. No intravenous contrast was administered. COMPARISON:  Lumbar spine radiographs dated 11/17/2018 FINDINGS: Segmentation:  Standard. Alignment: Prominent levocurvature. Minimal retrolisthesis L3 on L4. Vertebrae: Prominent Schmorl's nodes at the superior and inferior endplates of the L1 vertebral body. Additional scattered discogenic marrow changes. Edema like marrow signal within the left L5 pedicle without definite fracture line. Conus medullaris and cauda equina: Conus extends to the L1 level. Conus and cauda equina appear normal. Paraspinal and other soft tissues: 1.3 cm round T2 hyperintense lesion within the inferior pole of the left kidney, likely representing cyst. Disc levels: T12-L1: No significant disc protrusion, foraminal stenosis, or canal stenosis. 4 mm perineural cyst incidentally noted in the right neural foramen. L1-L2: No significant disc protrusion, foraminal stenosis, or canal stenosis. L2-L3: Mild diffuse disc bulge with left foraminal/far lateral component and mild bilateral facet arthrosis results in mild left foraminal narrowing. No canal stenosis. L3-L4: Diffuse disc bulge, bilateral facet arthropathy, and buckling of the ligamentum flavum results in severe narrowing of the right neural foramen with complete effacement of the right lateral recess. Mild left foraminal narrowing mild canal stenosis. There is  prominence of the epidural fat at this level. L4-L5: Broad-based disc bulge, severe bilateral facet arthrosis, and ligamentum flavum buckling results in severe bilateral foraminal narrowing with complete effacement of the lateral recesses bilaterally and severe canal narrowing. Bilateral facet joint effusions with complex 1.0 cm facet synovial cyst emanating off the posteroinferior aspect of the left facet joint. There is also prominence of the epidural fat at this level. L5-S1: Mild diffuse disc bulge and bilateral facet arthropathy results in moderate left foraminal narrowing without canal stenosis. IMPRESSION: 1. Multilevel lumbar spondylosis related to levoscoliotic curvature. Findings are most pronounced at the L4-5 level where there is severe canal stenosis and severe bilateral foraminal stenosis. 2. Bone marrow edema within the left L5 pedicle which may reflect reactive changes related to adjacent facet arthropathy versus stress related changes. 3. Prominence of the epidural fat in the spinal canal at the L3-4 and L4-5 levels, which can be seen in the setting of epidural lipomatosis. Electronically Signed   By: Davina Poke M.D.   On: 03/31/2019 14:35   Dg C-arm 1-60 Min  Result Date: 04/13/2019 CLINICAL DATA:  ACDF C4 through C7 EXAM: DG CERVICAL SPINE - 1 VIEW; DG C-ARM 61-120 MIN COMPARISON:  Cervical spine MRI 03/31/2019 FINDINGS: Lateral C-arm room.  Images were obtained in the operating room. Advanced cervical spondylosis. New she will image demonstrates needle localization of the C5-6 interspace with anterior approach. Additional images reveal anterior discectomy and interbody fusion C4 through C7. Anterior plate and screws in good position. Interbody bone plugs in good position. Sponge in the anterior soft tissues. IMPRESSION: Satisfactory ACDF C4 through C7. Electronically Signed   By: Franchot Gallo M.D.   On: 04/13/2019 14:07    Antibiotics:  Anti-infectives (From admission, onward)    Start     Dose/Rate Route  Frequency Ordered Stop   04/13/19 2000  ceFAZolin (ANCEF) IVPB 2g/100 mL premix     2 g 200 mL/hr over 30 Minutes Intravenous Every 8 hours 04/13/19 1538 04/14/19 0404   04/13/19 1229  bacitracin 50,000 Units in sodium chloride 0.9 % 500 mL irrigation  Status:  Discontinued       As needed 04/13/19 1230 04/13/19 1412      Discharge Exam: Blood pressure 110/86, pulse 66, temperature (!) 97.5 F (36.4 C), temperature source Oral, resp. rate 16, height 5\' 10"  (1.778 m), weight 72.6 kg, SpO2 99 %. Neurologic: Grossly normal Ambulating and voiding well  Discharge Medications:   Allergies as of 04/14/2019      Reactions   Valacyclovir Rash      Medication List    TAKE these medications   hydrochlorothiazide 25 MG tablet Commonly known as: HYDRODIURIL Take 1 tablet (25 mg total) by mouth daily.   ibuprofen 200 MG tablet Commonly known as: ADVIL Take 400 mg by mouth every 4 (four) hours as needed for moderate pain.   lisinopril 40 MG tablet Commonly known as: ZESTRIL TAKE 1 TABLET BY MOUTH EVERY DAY   methocarbamol 500 MG tablet Commonly known as: Robaxin Take 1 tablet (500 mg total) by mouth 4 (four) times daily.   oxyCODONE-acetaminophen 5-325 MG tablet Commonly known as: Percocet Take 1 tablet by mouth every 4 (four) hours as needed for severe pain.       Disposition: home   Final Dx: acdf C4-7  Discharge Instructions     Remove dressing in 72 hours   Complete by: As directed    Call MD for:  difficulty breathing, headache or visual disturbances   Complete by: As directed    Call MD for:  hives   Complete by: As directed    Call MD for:  persistant dizziness or light-headedness   Complete by: As directed    Call MD for:  persistant nausea and vomiting   Complete by: As directed    Call MD for:  redness, tenderness, or signs of infection (pain, swelling, redness, odor or green/yellow discharge around incision site)   Complete by: As  directed    Call MD for:  severe uncontrolled pain   Complete by: As directed    Call MD for:  temperature >100.4   Complete by: As directed    Diet - low sodium heart healthy   Complete by: As directed    Driving Restrictions   Complete by: As directed    No driving for 2 weeks, no riding in the car for 1 week   Increase activity slowly   Complete by: As directed          Signed: Ocie Cornfield Apollo Timothy 04/14/2019, 8:08 AM

## 2019-04-14 NOTE — Discharge Instructions (Signed)
Wound Care  Keep the incision clean and dry remove the outer dressing in 2 days, leave the Steri-Strips intact.  Do not put any creams, lotions, or ointments on incision. Leave steri-strips on neck.  They will fall off by themselves.  Activity Walk each and every day, increasing distance each day. No lifting greater than 5 lbs.  Avoid excessive neck motion. No lifting no bending no twisting no driving or riding a car unless coming back and forth to see me. Wear neck brace at all times except when showering.   Diet Resume your normal diet.   Return to Work Will be discussed at you follow up appointment.  Call Your Doctor If Any of These Occur Redness, drainage, or swelling at the wound.  Temperature greater than 101 degrees. Severe pain not relieved by pain medication. Incision starts to come apart.  Follow Up Appt Call today for appointment in 1-2 weeks ([phone]) or for problems.  If you have any hardware placed in your spine, you will need an x-ray before your appointment.   

## 2019-04-14 NOTE — Care Management Obs Status (Signed)
MEDICARE OBSERVATION STATUS NOTIFICATION   Patient Details  Name: Henry [name] MRN: [id number] Date of Birth: 1955-10-10   Medicare Observation Status Notification Given:  Yes    Sharin Mons, RN 04/14/2019, 10:59 AM

## 2019-04-14 NOTE — Plan of Care (Signed)
Patient alert and oriented, mae's well, voiding adequate amount of urine, swallowing without difficulty, no c/o pain at time of discharge. Patient discharged home with family. Script and discharged instructions given to patient. Patient and family stated understanding of instructions given. Patient has an appointment with Dr. Cram 

## 2019-04-15 ENCOUNTER — Encounter (HOSPITAL_COMMUNITY): Payer: Self-pay | Admitting: Neurosurgery

## 2019-04-15 ENCOUNTER — Telehealth: Payer: Self-pay | Admitting: *Deleted

## 2019-04-15 NOTE — Telephone Encounter (Signed)
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 The patient underwent acdf C4-5, 5-6, 6-7. The patient tolerated the procedure well, and was D/c 04/14/19. Pt will Follow up with Kary Kos in 1 week.Marland KitchenJohny Chess

## 2019-04-23 ENCOUNTER — Encounter (HOSPITAL_COMMUNITY): Payer: Self-pay | Admitting: Neurosurgery

## 2019-05-19 DIAGNOSIS — M542 Cervicalgia: Secondary | ICD-10-CM | POA: Diagnosis not present

## 2019-05-19 DIAGNOSIS — M5412 Radiculopathy, cervical region: Secondary | ICD-10-CM | POA: Diagnosis not present

## 2019-06-08 ENCOUNTER — Other Ambulatory Visit: Payer: Self-pay

## 2019-06-08 ENCOUNTER — Ambulatory Visit (INDEPENDENT_AMBULATORY_CARE_PROVIDER_SITE_OTHER): Payer: Medicare Other | Admitting: Internal Medicine

## 2019-06-08 ENCOUNTER — Encounter: Payer: Self-pay | Admitting: Internal Medicine

## 2019-06-08 ENCOUNTER — Other Ambulatory Visit (INDEPENDENT_AMBULATORY_CARE_PROVIDER_SITE_OTHER): Payer: Medicare Other

## 2019-06-08 VITALS — BP 156/96 | HR 83 | Temp 98.2°F | Resp 16 | Ht 70.0 in | Wt 157.2 lb

## 2019-06-08 DIAGNOSIS — E785 Hyperlipidemia, unspecified: Secondary | ICD-10-CM

## 2019-06-08 DIAGNOSIS — I10 Essential (primary) hypertension: Secondary | ICD-10-CM

## 2019-06-08 DIAGNOSIS — Z23 Encounter for immunization: Secondary | ICD-10-CM

## 2019-06-08 DIAGNOSIS — T50905A Adverse effect of unspecified drugs, medicaments and biological substances, initial encounter: Secondary | ICD-10-CM

## 2019-06-08 DIAGNOSIS — H8110 Benign paroxysmal vertigo, unspecified ear: Secondary | ICD-10-CM | POA: Insufficient documentation

## 2019-06-08 DIAGNOSIS — H8113 Benign paroxysmal vertigo, bilateral: Secondary | ICD-10-CM | POA: Diagnosis not present

## 2019-06-08 LAB — BASIC METABOLIC PANEL
BUN: 12 mg/dL (ref 6–23)
CO2: 30 mEq/L (ref 19–32)
Calcium: 10.7 mg/dL — ABNORMAL HIGH (ref 8.4–10.5)
Chloride: 101 mEq/L (ref 96–112)
Creatinine, Ser: 0.83 mg/dL (ref 0.40–1.50)
GFR: 92.82 mL/min (ref >60.00)
Glucose, Bld: 100 mg/dL — ABNORMAL HIGH (ref 70–99)
Potassium: 4.2 mEq/L (ref 3.5–5.1)
Sodium: 140 mEq/L (ref 135–145)

## 2019-06-08 LAB — LIPID PANEL
Cholesterol: 185 mg/dL (ref 0–200)
HDL: 85.4 mg/dL (ref >39.00)
LDL Cholesterol: 76 mg/dL (ref 0–99)
NonHDL: 99.41
Total CHOL/HDL Ratio: 2
Triglycerides: 118 mg/dL (ref 0.0–149.0)
VLDL: 23.6 mg/dL (ref 0.0–40.0)

## 2019-06-08 LAB — SEDIMENTATION RATE: Sed Rate: 2 mm/hr (ref 0–20)

## 2019-06-08 MED ORDER — MECLIZINE HCL 25 MG PO TABS: 25.0000 mg | ORAL_TABLET | Freq: Three times a day (TID) | ORAL | 1 refills | Status: DC | PRN

## 2019-06-08 NOTE — Progress Notes (Signed)
Subjective:  Patient ID: Henry [name], male    DOB: [birth year]  Age: 63 y.o. MRN: [id number]  CC: Hypertension   HPI Henry [name] presents for f/up - He complains of a 3-day history of dizziness and vertigo associated with head movements.  He had this before about 8 months ago and it resolved spontaneously.  He has had mild nausea but he denies vomiting, headache, visual disturbance, hearing loss, ringing in the ears, or paresthesias.  Outpatient Medications Prior to Visit  Medication Sig Dispense Refill  . hydrochlorothiazide (HYDRODIURIL) 25 MG tablet Take 1 tablet (25 mg total) by mouth daily. 90 tablet 2  . ibuprofen (ADVIL) 200 MG tablet Take 400 mg by mouth every 4 (four) hours as needed for moderate pain.    Marland Kitchen lisinopril (ZESTRIL) 40 MG tablet TAKE 1 TABLET BY MOUTH EVERY DAY (Patient taking differently: Take 40 mg by mouth daily. ) 90 tablet 1  . methocarbamol (ROBAXIN) 500 MG tablet Take 1 tablet (500 mg total) by mouth 4 (four) times daily. 45 tablet 0  . oxyCODONE-acetaminophen (PERCOCET) 5-325 MG tablet Take 1 tablet by mouth every 4 (four) hours as needed for severe pain. 20 tablet 0   No facility-administered medications prior to visit.     ROS Review of Systems  Constitutional: Negative.  Negative for chills, diaphoresis, fatigue and fever.  HENT: Negative.  Negative for sore throat and trouble swallowing.   Eyes: Negative for photophobia and visual disturbance.  Respiratory: Negative for cough, chest tightness, shortness of breath and wheezing.   Cardiovascular: Negative for chest pain, palpitations and leg swelling.  Gastrointestinal: Positive for nausea. Negative for abdominal pain, diarrhea and vomiting.  Endocrine: Negative.   Genitourinary: Negative.  Negative for difficulty urinating.  Musculoskeletal: Negative for back pain and neck pain.  Skin: Negative.  Negative for color change and rash.  Neurological: Positive for dizziness. Negative for tremors,  seizures, syncope, facial asymmetry, speech difficulty, weakness, light-headedness, numbness and headaches.  Hematological: Negative for adenopathy. Does not bruise/bleed easily.  Psychiatric/Behavioral: Negative.     Objective:  BP (!) 156/96 (BP Location: Right Arm, Patient Position: Sitting, Cuff Size: Normal)   Pulse 83   Temp 98.2 F (36.8 C) (Oral)   Resp 16   Ht 5\' 10"  (1.778 m)   Wt 157 lb 4 oz (71.3 kg)   SpO2 97%   BMI 22.56 kg/m   BP Readings from Last 3 Encounters:  06/08/19 (!) 156/96  04/14/19 110/86  03/31/19 127/84    Wt Readings from Last 3 Encounters:  06/08/19 157 lb 4 oz (71.3 kg)  04/13/19 160 lb (72.6 kg)  03/31/19 160 lb (72.6 kg)    Physical Exam Vitals signs reviewed.  Constitutional:      Appearance: Normal appearance.  HENT:     Nose: Nose normal.     Mouth/Throat:     Mouth: Mucous membranes are moist.     Pharynx: No oropharyngeal exudate.  Eyes:     General: No scleral icterus.       Right eye: No discharge.        Left eye: No discharge.     Extraocular Movements: Extraocular movements intact.     Conjunctiva/sclera: Conjunctivae normal.     Pupils: Pupils are equal, round, and reactive to light.  Neck:     Musculoskeletal: Normal range of motion and neck supple.  Cardiovascular:     Rate and Rhythm: Normal rate and regular rhythm.  Heart sounds: No murmur.  Pulmonary:     Effort: Pulmonary effort is normal.     Breath sounds: No stridor. No wheezing, rhonchi or rales.  Abdominal:     General: Abdomen is flat. Bowel sounds are normal. There is no distension.     Palpations: Abdomen is soft. There is no hepatomegaly or splenomegaly.     Tenderness: There is no abdominal tenderness.  Musculoskeletal: Normal range of motion.     Right lower leg: No edema.     Left lower leg: No edema.  Lymphadenopathy:     Cervical: No cervical adenopathy.  Skin:    General: Skin is warm and dry.     Coloration: Skin is not pale.   Neurological:     General: No focal deficit present.     Mental Status: He is alert and oriented to person, place, and time. Mental status is at baseline.     Cranial Nerves: No cranial nerve deficit.     Sensory: No sensory deficit.     Motor: No weakness.     Coordination: Coordination normal.     Gait: Gait normal.     Deep Tendon Reflexes: Reflexes normal.  Psychiatric:        Mood and Affect: Mood normal.        Behavior: Behavior normal.     Lab Results  Component Value Date   WBC 6.4 03/31/2019   HGB 13.9 03/31/2019   HCT 41.7 03/31/2019   PLT 237 03/31/2019   GLUCOSE 100 (H) 06/08/2019   CHOL 185 06/08/2019   TRIG 118.0 06/08/2019   HDL 85.40 06/08/2019   LDLCALC 76 06/08/2019   ALT 15 03/31/2019   AST 19 03/31/2019   NA 140 06/08/2019   K 4.2 06/08/2019   CL 101 06/08/2019   CREATININE 0.83 06/08/2019   BUN 12 06/08/2019   CO2 30 06/08/2019   TSH 1.17 11/17/2018   PSA 0.56 11/17/2018   HGBA1C 5.0 04/13/2019    No results found.  Assessment & Plan:   Hoss was seen today for hypertension.  Diagnoses and all orders for this visit:  Essential hypertension- His blood pressure does not appear to be adequately well controlled but he thinks this is the whitecoat phenomenon as well as not feeling well today.  He will continue taking lisinopril and we will recheck his blood pressure in the next few months. -     Basic metabolic panel; Future  Hyperlipidemia LDL goal <130- His ASCVD risk or is less than 15% so I did not recommend a statin for CV risk reduction. -     Lipid panel; Future  Benign paroxysmal positional vertigo due to bilateral vestibular disorder -     meclizine (ANTIVERT) 25 MG tablet; Take 1 tablet (25 mg total) by mouth 3 (three) times daily as needed for dizziness. 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  He is asymptomatic with this.  Will continue to monitor and if his calcium level remains elevated then will consider changing to a different antihypertensive.   I am having Geovonnie Koonz. Vanpatten start on meclizine. I am also having him maintain his lisinopril, hydrochlorothiazide, ibuprofen, oxyCODONE-acetaminophen, and methocarbamol.  Meds ordered this encounter  Medications  . meclizine (ANTIVERT) 25 MG tablet    Sig: Take 1 tablet (25  mg total) by mouth 3 (three) times daily as needed for dizziness.    Dispense:  90 tablet    Refill:  1     Follow-up: Return in about 4 weeks (around 07/06/2019).  [name], MD

## 2019-06-08 NOTE — Patient Instructions (Signed)

## 2019-06-09 ENCOUNTER — Encounter: Payer: Self-pay | Admitting: Internal Medicine

## 2019-06-09 DIAGNOSIS — T50905A Adverse effect of unspecified drugs, medicaments and biological substances, initial encounter: Secondary | ICD-10-CM | POA: Insufficient documentation

## 2019-06-16 DIAGNOSIS — M542 Cervicalgia: Secondary | ICD-10-CM | POA: Diagnosis not present

## 2019-06-16 DIAGNOSIS — M5412 Radiculopathy, cervical region: Secondary | ICD-10-CM | POA: Diagnosis not present

## 2019-06-16 DIAGNOSIS — M4120 Other idiopathic scoliosis, site unspecified: Secondary | ICD-10-CM | POA: Diagnosis not present

## 2019-06-16 DIAGNOSIS — M48062 Spinal stenosis, lumbar region with neurogenic claudication: Secondary | ICD-10-CM | POA: Diagnosis not present

## 2019-07-09 ENCOUNTER — Other Ambulatory Visit: Payer: Self-pay | Admitting: Internal Medicine

## 2019-07-09 DIAGNOSIS — M4120 Other idiopathic scoliosis, site unspecified: Secondary | ICD-10-CM | POA: Diagnosis not present

## 2019-07-09 DIAGNOSIS — M5440 Lumbago with sciatica, unspecified side: Secondary | ICD-10-CM | POA: Diagnosis not present

## 2019-07-09 DIAGNOSIS — I10 Essential (primary) hypertension: Secondary | ICD-10-CM

## 2019-07-27 DIAGNOSIS — M48062 Spinal stenosis, lumbar region with neurogenic claudication: Secondary | ICD-10-CM | POA: Diagnosis not present

## 2019-07-27 DIAGNOSIS — M47816 Spondylosis without myelopathy or radiculopathy, lumbar region: Secondary | ICD-10-CM | POA: Diagnosis not present

## 2019-08-04 DIAGNOSIS — I10 Essential (primary) hypertension: Secondary | ICD-10-CM | POA: Diagnosis not present

## 2019-08-04 DIAGNOSIS — M47816 Spondylosis without myelopathy or radiculopathy, lumbar region: Secondary | ICD-10-CM | POA: Diagnosis not present

## 2019-10-06 DIAGNOSIS — M47816 Spondylosis without myelopathy or radiculopathy, lumbar region: Secondary | ICD-10-CM | POA: Diagnosis not present

## 2019-10-20 DIAGNOSIS — M542 Cervicalgia: Secondary | ICD-10-CM | POA: Diagnosis not present

## 2019-10-20 DIAGNOSIS — M5440 Lumbago with sciatica, unspecified side: Secondary | ICD-10-CM | POA: Diagnosis not present

## 2019-11-19 ENCOUNTER — Other Ambulatory Visit: Payer: Self-pay | Admitting: Internal Medicine

## 2019-11-19 DIAGNOSIS — I10 Essential (primary) hypertension: Secondary | ICD-10-CM

## 2019-12-01 ENCOUNTER — Other Ambulatory Visit: Payer: Self-pay | Admitting: Internal Medicine

## 2019-12-01 DIAGNOSIS — H8113 Benign paroxysmal vertigo, bilateral: Secondary | ICD-10-CM

## 2020-01-11 ENCOUNTER — Other Ambulatory Visit: Payer: Self-pay | Admitting: Internal Medicine

## 2020-01-11 DIAGNOSIS — I10 Essential (primary) hypertension: Secondary | ICD-10-CM

## 2020-01-25 ENCOUNTER — Ambulatory Visit (INDEPENDENT_AMBULATORY_CARE_PROVIDER_SITE_OTHER): Payer: Medicare Other | Admitting: Internal Medicine

## 2020-01-25 ENCOUNTER — Other Ambulatory Visit: Payer: Self-pay

## 2020-01-25 ENCOUNTER — Other Ambulatory Visit: Payer: Self-pay | Admitting: Internal Medicine

## 2020-01-25 ENCOUNTER — Encounter: Payer: Self-pay | Admitting: Internal Medicine

## 2020-01-25 VITALS — BP 130/80 | HR 65 | Temp 98.5°F | Ht 70.0 in | Wt 160.0 lb

## 2020-01-25 DIAGNOSIS — I10 Essential (primary) hypertension: Secondary | ICD-10-CM

## 2020-01-25 DIAGNOSIS — T50905A Adverse effect of unspecified drugs, medicaments and biological substances, initial encounter: Secondary | ICD-10-CM | POA: Diagnosis not present

## 2020-01-25 DIAGNOSIS — Z23 Encounter for immunization: Secondary | ICD-10-CM

## 2020-01-25 NOTE — Progress Notes (Addendum)
Subjective:  Patient ID: Henry [name], male    DOB: 06-28-1956  Age: 64 y.o. MRN: [id number]  CC: Hypertension  This visit occurred during the SARS-CoV-2 public health emergency.  Safety protocols were in place, including screening questions prior to the visit, additional usage of staff PPE, and extensive cleaning of exam room while observing appropriate contact time as indicated for disinfecting solutions.    HPI Henry [name] presents for f/up - He tells me that his blood pressure is well controlled.  He returns for follow-up on hypercalcemia.  He is asymptomatic with respect to this.  He denies any recent episodes of abdominal pain, nausea, vomiting, or constipation.  He rides a bike about 4 miles per day and denies and recent episodes of chest pain or shortness of breath.  Outpatient Medications Prior to Visit  Medication Sig Dispense Refill  . hydrochlorothiazide (HYDRODIURIL) 25 MG tablet TAKE 1 TABLET BY MOUTH EVERY DAY 90 tablet 0  . lisinopril (ZESTRIL) 40 MG tablet Take 1 tablet (40 mg total) by mouth daily. 90 tablet 1  . meclizine (ANTIVERT) 25 MG tablet TAKE 1 TABLET (25 MG TOTAL) BY MOUTH 3 (THREE) TIMES DAILY AS NEEDED FOR DIZZINESS. (Patient not taking: Reported on 01/25/2020) 90 tablet 1   No facility-administered medications prior to visit.    ROS Review of Systems  Constitutional: Negative for appetite change, diaphoresis, fatigue and unexpected weight change.  HENT: Negative.   Eyes: Negative for visual disturbance.  Respiratory: Negative for cough, chest tightness, shortness of breath and wheezing.   Cardiovascular: Negative for chest pain, palpitations and leg swelling.  Gastrointestinal: Negative for abdominal pain, constipation, diarrhea, nausea and vomiting.  Endocrine: Negative.   Genitourinary: Negative.  Negative for difficulty urinating, dysuria and frequency.  Musculoskeletal: Negative for arthralgias and myalgias.  Skin: Negative.  Negative for color  change and pallor.  Neurological: Negative.  Negative for dizziness, weakness, light-headedness and headaches.  Hematological: Negative for adenopathy. Does not bruise/bleed easily.  Psychiatric/Behavioral: Negative.     Objective:  BP 130/80 (BP Location: Left Arm, Patient Position: Sitting, Cuff Size: Large)   Pulse 65   Temp 98.5 F (36.9 C) (Oral)   Ht 5\' 10"  (1.778 m)   Wt 160 lb (72.6 kg)   SpO2 97%   BMI 22.96 kg/m   BP Readings from Last 3 Encounters:  01/25/20 130/80  06/08/19 (!) 156/96  04/14/19 110/86    Wt Readings from Last 3 Encounters:  01/25/20 160 lb (72.6 kg)  06/08/19 157 lb 4 oz (71.3 kg)  04/13/19 160 lb (72.6 kg)    Physical Exam Vitals reviewed.  Constitutional:      Appearance: Normal appearance.  HENT:     Nose: Nose normal.     Mouth/Throat:     Mouth: Mucous membranes are moist.  Eyes:     General: No scleral icterus.    Conjunctiva/sclera: Conjunctivae normal.  Cardiovascular:     Rate and Rhythm: Normal rate and regular rhythm.     Heart sounds: No murmur.  Pulmonary:     Effort: Pulmonary effort is normal.     Breath sounds: No stridor. No wheezing, rhonchi or rales.  Abdominal:     General: Abdomen is flat. Bowel sounds are normal. There is no distension.     Palpations: Abdomen is soft. There is no hepatomegaly, splenomegaly or mass.     Tenderness: There is no abdominal tenderness.  Musculoskeletal:        General: Normal  range of motion.     Cervical back: Neck supple.     Right lower leg: No edema.     Left lower leg: No edema.  Lymphadenopathy:     Cervical: No cervical adenopathy.  Skin:    General: Skin is warm and dry.     Findings: No rash.  Neurological:     General: No focal deficit present.     Mental Status: He is alert.  Psychiatric:        Mood and Affect: Mood normal.        Behavior: Behavior normal.     Lab Results  Component Value Date   WBC 6.4 03/31/2019   HGB 13.9 03/31/2019   HCT 41.7  03/31/2019   PLT 237 03/31/2019   GLUCOSE 110 (H) 01/25/2020   CHOL 185 06/08/2019   TRIG 118.0 06/08/2019   HDL 85.40 06/08/2019   LDLCALC 76 06/08/2019   ALT 15 03/31/2019   AST 19 03/31/2019   NA 141 01/25/2020   K 4.2 01/25/2020   CL 103 01/25/2020   CREATININE 0.91 01/25/2020   BUN 24 (H) 01/25/2020   CO2 31 01/25/2020   TSH 1.17 11/17/2018   PSA 0.56 11/17/2018   HGBA1C 5.0 04/13/2019    No results found.  Assessment & Plan:   Henry [name] was seen today for hypertension.  Diagnoses and all orders for this visit:  Essential hypertension- His blood pressure is adequately well controlled.  Electrolytes and renal function are normal.  I have asked him to stop taking hydrochlorothiazide and will attempt to control his blood pressure with the ACE inhibitor. 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  I have asked him to stop taking hydrochlorothiazide. -     Basic metabolic panel; Future -     Basic metabolic panel  Need for pneumococcal vaccination -     Pneumococcal polysaccharide vaccine 23-valent greater than or equal to 2yo subcutaneous/IM   I have discontinued Henry [name]. Henry [name]'s hydrochlorothiazide. I am also having him maintain his meclizine.  No orders of the defined types were placed in this encounter.    Follow-up: Return in about 6 months (around 07/27/2020).  Henry Calico, MD

## 2020-01-25 NOTE — Patient Instructions (Signed)
Hypercalcemia Hypercalcemia is when the level of calcium in a person's blood is above normal. The body needs calcium to make bones and keep them strong. Calcium also helps the muscles, nerves, brain, and heart work the way they should. Most of the calcium in the body is in the bones. There is also some calcium in the blood. Hypercalcemia can happen when calcium comes out of the bones, or when the kidneys are not able to remove calcium from the blood. Hypercalcemia can be mild or severe. What are the causes? There are many possible causes of hypercalcemia. Common causes of this condition include:  Hyperparathyroidism. This is a condition in which the body produces too much parathyroid hormone. There are four parathyroid glands in your neck. These glands produce a chemical messenger (hormone) that helps the body absorb calcium from foods and helps your bones release calcium.  Certain kinds of cancer. Less common causes of hypercalcemia include:  Getting too much calcium or vitamin D from your diet.  Kidney failure.  Hyperthyroidism.  Severe dehydration.  Being on bed rest or being inactive for a long time.  Certain medicines.  Infections. What increases the risk? You are more likely to develop this condition if you:  Are male.  Are 60 years of age or older.  Have a family history of hypercalcemia. What are the signs or symptoms? Mild hypercalcemia that starts slowly may not cause symptoms. Severe, sudden hypercalcemia is more likely to cause symptoms, such as:  Being more thirsty than usual.  Needing to urinate more often than usual.  Abdominal pain.  Nausea and vomiting.  Constipation.  Muscle pain, twitching, or weakness.  Feeling very tired. How is this diagnosed?  Hypercalcemia is usually diagnosed with a blood test. You may also have tests to help determine what is causing this condition, such as imaging tests and more blood tests. How is this  treated? Treatment for hypercalcemia depends on the cause. Treatment may include:  Receiving fluids through an IV.  Medicines that: ? Keep calcium levels steady after receiving fluids (loop diuretics). ? Keep calcium in your bones (bisphosphonates). ? Lower the calcium level in your blood.  Surgery to remove overactive parathyroid glands.  A procedure that filters your blood to correct calcium levels (hemodialysis). Follow these instructions at home:   Take over-the-counter and prescription medicines only as told by your health care provider.  Follow instructions from your health care provider about eating or drinking restrictions.  Drink enough fluid to keep your urine pale yellow.  Stay active. Weight-bearing exercise helps to keep calcium in your bones. Follow instructions from your health care provider about what type and level of exercise is safe for you.  Keep all follow-up visits as told by your health care provider. This is important. Contact a health care provider if you have:  A fever.  A heartbeat that is irregular or very fast.  Changes in mood, memory, or personality. Get help right away if you:  Have severe abdominal pain.  Have chest pain.  Have trouble breathing.  Become very confused and sleepy.  Lose consciousness. Summary  Hypercalcemia is when the level of calcium in a person's blood is above normal. The body needs calcium to make bones and keep them strong. Calcium also helps the muscles, nerves, brain, and heart work the way they should.  There are many possible causes of hypercalcemia, and treatment depends on the cause.  Take over-the-counter and prescription medicines only as told by your health care   provider.  Follow instructions from your health care provider about eating or drinking restrictions. This information is not intended to replace advice given to you by your health care provider. Make sure you discuss any questions you have with  your health care provider. Document Revised: 09/23/2018 Document Reviewed: 06/02/2018 Elsevier Patient Education  2020 Elsevier Inc.  

## 2020-01-26 ENCOUNTER — Encounter: Payer: Self-pay | Admitting: Internal Medicine

## 2020-01-26 LAB — BASIC METABOLIC PANEL
BUN: 24 mg/dL — ABNORMAL HIGH (ref 6–23)
CO2: 31 mEq/L (ref 19–32)
Calcium: 9.3 mg/dL (ref 8.4–10.5)
Chloride: 103 mEq/L (ref 96–112)
Creatinine, Ser: 0.91 mg/dL (ref 0.40–1.50)
GFR: 83.31 mL/min (ref >60.00)
Glucose, Bld: 110 mg/dL — ABNORMAL HIGH (ref 70–99)
Potassium: 4.2 mEq/L (ref 3.5–5.1)
Sodium: 141 mEq/L (ref 135–145)

## 2020-02-22 ENCOUNTER — Other Ambulatory Visit: Payer: Self-pay | Admitting: Internal Medicine

## 2020-02-22 DIAGNOSIS — I10 Essential (primary) hypertension: Secondary | ICD-10-CM

## 2020-03-25 ENCOUNTER — Other Ambulatory Visit: Payer: Self-pay | Admitting: Neurosurgery

## 2020-03-25 DIAGNOSIS — M5440 Lumbago with sciatica, unspecified side: Secondary | ICD-10-CM

## 2020-04-11 ENCOUNTER — Ambulatory Visit
Admission: RE | Admit: 2020-04-11 | Discharge: 2020-04-11 | Disposition: A | Discharge status: Home / Self Care | Payer: Medicare Other | Source: Ambulatory Visit | Attending: Neurosurgery | Admitting: Neurosurgery

## 2020-04-11 DIAGNOSIS — M5440 Lumbago with sciatica, unspecified side: Secondary | ICD-10-CM

## 2020-05-24 ENCOUNTER — Other Ambulatory Visit: Payer: Self-pay

## 2020-05-24 ENCOUNTER — Encounter: Payer: Self-pay | Admitting: Internal Medicine

## 2020-05-24 ENCOUNTER — Ambulatory Visit (INDEPENDENT_AMBULATORY_CARE_PROVIDER_SITE_OTHER): Payer: Medicare Other | Admitting: Internal Medicine

## 2020-05-24 VITALS — BP 122/84 | HR 63 | Ht 70.0 in | Wt 157.0 lb

## 2020-05-24 DIAGNOSIS — E559 Vitamin D deficiency, unspecified: Secondary | ICD-10-CM | POA: Diagnosis not present

## 2020-05-24 DIAGNOSIS — M810 Age-related osteoporosis without current pathological fracture: Secondary | ICD-10-CM

## 2020-05-24 NOTE — Patient Instructions (Signed)
Labs today

## 2020-05-24 NOTE — Progress Notes (Signed)
Name: Henry [name]  [name], [birth year]    Age/ Sex: 64 y.o., male    PCP: Janith Lima, MD   Reason for Endocrinology Evaluation: Osteoporosis     Date of Initial Endocrinology Evaluation: 05/24/2020     HPI: [name] is a 64 y.o. male with a past medical history of DJD. The patient presented for initial endocrinology clinic visit on 05/24/2020 for consultative assistance with his low Bone density .   Pt was diagnosed with Osteoporosis 02/26/2019: Fracture Hx: no FH of osteoporosis or hip fracture: no Prior Hx of anti-resorptive therapy : no  Of note, the pt has had multiple rounds of steroids injections in the past.   Does not eat dairy No calcium or vitamin D intake.   Has rare occasional heart burn   HISTORY:  Past Medical History:  Past Medical History:  Diagnosis Date  . Allergy   . Anemia   . Arthritis   . Depression    due to long tern hepatitis C - once the Hep C was cured, had not any issues until last year 18-Dec-2017) when is wife died  . History of hepatitis C    has been treated in 12-19-2003  . Hypertension   . Pneumonia    as a teenager  . Scoliosis    Past Surgical History:  Past Surgical History:  Procedure Laterality Date  . ANTERIOR CERVICAL DECOMP/DISCECTOMY FUSION N/A 04/13/2019   Procedure: ANTERIOR CERVICAL DECOMPRESSION/DISCECTOMY FUSION CERVICAL FOUR-FIVE, CERVICAL FIVE-SIX, CERVICAL SIX-SEVEN;  Surgeon: Kary Kos, MD;  Location: [facility];  Service: Neurosurgery;  Laterality: N/A;  . COLONOSCOPY    . RADIOLOGY WITH ANESTHESIA N/A 03/31/2019   Procedure: MRI WITH ANESTHESIA LUMBAR WITHOUT CONTRAST AND CERVICAL WITHOUT CONTRAST;  Surgeon: Radiologist, Medication, MD;  Location: [facility];  Service: Radiology;  Laterality: N/A;  . TONSILLECTOMY AND ADENOIDECTOMY        Social History:  reports that he has quit smoking. He quit after 24.00 years of use. He has never used smokeless tobacco. He reports current alcohol use of about 35.0  standard drinks of alcohol per week. He reports current drug use. Drug: Marijuana.  Family History: family history includes Alcoholism in his brother.   HOME MEDICATIONS: Allergies as of 05/24/2020      Reactions   Valacyclovir Rash      Medication List       Accurate as of May 24, 2020 10:34 AM. If you have any questions, ask your nurse or doctor.        hydrochlorothiazide 25 MG tablet Commonly known as: HYDRODIURIL Take 25 mg by mouth daily.   HYDROcodone-acetaminophen 5-325 MG tablet Commonly known as: NORCO/VICODIN Take 1 tablet by mouth every 4 (four) hours as needed for moderate pain. Take 1 tablet by mouth every 4 hours as needed for severe pain.   ibuprofen 200 MG tablet Commonly known as: ADVIL Take 400 mg by mouth every 6 (six) hours as needed. Take 2 tablets (400mg  total) by mouth every 4 hours as needed for moderate pain.   lisinopril 40 MG tablet Commonly known as: ZESTRIL TAKE 1 TABLET BY MOUTH EVERY DAY   meclizine 25 MG tablet Commonly known as: ANTIVERT TAKE 1 TABLET (25 MG TOTAL) BY MOUTH 3 (THREE) TIMES DAILY AS NEEDED FOR DIZZINESS.   methocarbamol 500 MG tablet Commonly known as: ROBAXIN Take 500 mg by mouth 4 (four) times daily. Take 1 tablet by mouth four times daily.  REVIEW OF SYSTEMS: A comprehensive ROS was conducted with the patient and is negative except as per HPI and below:  ROS     OBJECTIVE:  VS: BP 122/84 (BP Location: Left Arm, Patient Position: Sitting, Cuff Size: Normal)   Pulse 63   Ht 5\' 10"  (1.778 m)   Wt 157 lb (71.2 kg)   SpO2 95%   BMI 22.53 kg/m    Wt Readings from Last 3 Encounters:  05/24/20 157 lb (71.2 kg)  01/25/20 160 lb (72.6 kg)  06/08/19 157 lb 4 oz (71.3 kg)     EXAM: General: Pt appears well and is in NAD  Hydration: Well-hydrated with moist mucous membranes and good skin turgor  Eyes: External eye exam normal without stare, lid lag or exophthalmos.  EOM intact.  PERRL.  Ears,  Nose, Throat: Hearing: Grossly intact bilaterally Dental: Good dentition  Throat: Clear without mass, erythema or exudate  Neck: General: Supple without adenopathy. Thyroid: Thyroid size normal.  No goiter or nodules appreciated. No thyroid bruit.  Lungs: Clear with good BS bilat with no rales, rhonchi, or wheezes  Heart: Auscultation: RRR.  Abdomen: Normoactive bowel sounds, soft, nontender, without masses or organomegaly palpable  Extremities: Gait and station: Normal gait  Digits and nails: No clubbing, cyanosis, petechiae, or nodes Head and neck: Normal alignment and mobility BL UE: Normal ROM and strength. BL LE: No pretibial edema normal ROM and strength.  Skin: Hair: Texture and amount normal with gender appropriate distribution Skin Inspection: No rashes, acanthosis nigricans/skin tags. No lipohypertrophy Skin Palpation: Skin temperature, texture, and thickness normal to palpation  Neuro: Cranial nerves: II - XII grossly intact  Cerebellar: Normal coordination and movement; no tremor Motor: Normal strength throughout DTRs: 2+ and symmetric in UE without delay in relaxation phase  Mental Status: Judgment, insight: Intact Orientation: Oriented to time, place, and person Memory: Intact for recent and remote events Mood and affect: No depression, anxiety, or agitation     DATA REVIEWED:  Results for Henry [name] (MRN [id number]) as of 05/25/2020 11:09  Ref. Range 05/24/2020 10:59  Sodium Latest Ref Range: 135 - 146 mmol/L 141  Potassium Latest Ref Range: 3.5 - 5.3 mmol/L 4.4  Chloride Latest Ref Range: 98 - 110 mmol/L 106  CO2 Latest Ref Range: 20 - 32 mmol/L 26  Glucose Latest Ref Range: 65 - 99 mg/dL 91  BUN Latest Ref Range: 7 - 25 mg/dL 19  Creatinine Latest Ref Range: 0.70 - 1.25 mg/dL 0.85  Calcium Latest Ref Range: 8.6 - 10.3 mg/dL 9.4  BUN/Creatinine Ratio Latest Ref Range: 6 - 22 (calc) NOT APPLICABLE  AG Ratio Latest Ref Range: 1.0 - 2.5 (calc) 1.7  AST Latest  Ref Range: 10 - 35 U/L 19  ALT Latest Ref Range: 9 - 46 U/L 12  Total Protein Latest Ref Range: 6.1 - 8.1 g/dL 6.5  Total Bilirubin Latest Ref Range: 0.2 - 1.2 mg/dL 0.7  Alkaline phosphatase (APISO) Latest Ref Range: 35 - 144 U/L 79  Vitamin D, 25-Hydroxy Latest Ref Range: 30 - 100 ng/mL 26 (L)  Globulin Latest Ref Range: 1.9 - 3.7 g/dL (calc) 2.4  Albumin MSPROF Latest Ref Range: 3.6 - 5.1 g/dL 4.1       DXA 02/26/2019 The BMD measured at AP Spine L1-L2 is 0.865 g/cm2 with a T-score of -2.5. This patient is considered Osteoporotic according to [facility]) criteria.  The scan quality is good. L-3, L-4 were excluded due to degenerative changes  and scoliosis.  Site Region Measured Date Measured Age YA BMD Significant CHANGE T-score [facility] Sp[facility]  L1-L2      02/26/2019    65.2         -2.5    0.865 g/cm2  DualFemur Neck Left  02/26/2019    63.2         -1.8    0.788 g/cm2  DualFemur Total Mean 1/26/2019    65.2         -1.4    0.836 g/cm2 ASSESSMENT/PLAN/RECOMMENDATIONS:   1. Osteoporosis:  - We discussed the importance of calcium and vitamin D intake - We discussed increased risk at the spine - We discussed anti-resorptive options such as bisphosphonates and prolia.  - We agreed to proceed with bisphosphanate as his T-score is at -2.5  - We discussed rare side effect of atypical fractures and osteonecrosis. He is up to date on dental exam   Medications : Calcium 600 mg BID Alendronate 70 mg weekly     2. Vitamin D insufficiency:   Will replenish OTC Vitamin D3 1000 iu daily   Signed electronically by: Mack Guise, MD  [facility]  [facility] [address] [place], [place] [address] Phone: [phone] FAX: [phone]   CC: Janith Lima, MD [place] [address] Phone: [phone] Fax: [phone]   Return to Endocrinology clinic as below: Future Appointments   Date Time Provider [place]  05/24/2020 10:50 AM Yaquelin Langelier, Melanie Crazier, MD [facility] [place]

## 2020-05-25 DIAGNOSIS — E559 Vitamin D deficiency, unspecified: Secondary | ICD-10-CM | POA: Insufficient documentation

## 2020-05-25 DIAGNOSIS — M810 Age-related osteoporosis without current pathological fracture: Secondary | ICD-10-CM | POA: Insufficient documentation

## 2020-05-25 LAB — COMPREHENSIVE METABOLIC PANEL
AG Ratio: 1.7 (calc) (ref 1.0–2.5)
ALT: 12 U/L (ref 9–46)
AST: 19 U/L (ref 10–35)
Albumin: 4.1 g/dL (ref 3.6–5.1)
Alkaline phosphatase (APISO): 79 U/L (ref 35–144)
BUN: 19 mg/dL (ref 7–25)
CO2: 26 mmol/L (ref 20–32)
Calcium: 9.4 mg/dL (ref 8.6–10.3)
Chloride: 106 mmol/L (ref 98–110)
Creat: 0.85 mg/dL (ref 0.70–1.25)
Globulin: 2.4 g/dL (calc) (ref 1.9–3.7)
Glucose, Bld: 91 mg/dL (ref 65–99)
Potassium: 4.4 mmol/L (ref 3.5–5.3)
Sodium: 141 mmol/L (ref 135–146)
Total Bilirubin: 0.7 mg/dL (ref 0.2–1.2)
Total Protein: 6.5 g/dL (ref 6.1–8.1)

## 2020-05-25 LAB — VITAMIN D 25 HYDROXY (VIT D DEFICIENCY, FRACTURES): Vit D, 25-Hydroxy: 26 ng/mL — ABNORMAL LOW (ref 30–100)

## 2020-05-25 MED ORDER — ALENDRONATE SODIUM 70 MG PO TABS: 70.0000 mg | ORAL_TABLET | ORAL | 3 refills | Status: DC

## 2020-07-24 ENCOUNTER — Other Ambulatory Visit: Payer: Self-pay | Admitting: Internal Medicine

## 2020-07-24 DIAGNOSIS — I10 Essential (primary) hypertension: Secondary | ICD-10-CM

## 2020-10-24 ENCOUNTER — Encounter: Payer: Self-pay | Admitting: Internal Medicine

## 2020-11-03 ENCOUNTER — Other Ambulatory Visit: Payer: Self-pay | Admitting: Internal Medicine

## 2020-11-10 ENCOUNTER — Ambulatory Visit (INDEPENDENT_AMBULATORY_CARE_PROVIDER_SITE_OTHER): Payer: Medicare Other | Admitting: Internal Medicine

## 2020-11-10 ENCOUNTER — Other Ambulatory Visit: Payer: Self-pay

## 2020-11-10 ENCOUNTER — Encounter: Payer: Self-pay | Admitting: Internal Medicine

## 2020-11-10 VITALS — BP 124/82 | HR 79 | Temp 97.6°F | Ht 70.0 in | Wt 159.0 lb

## 2020-11-10 DIAGNOSIS — I10 Essential (primary) hypertension: Secondary | ICD-10-CM | POA: Diagnosis not present

## 2020-11-10 DIAGNOSIS — E785 Hyperlipidemia, unspecified: Secondary | ICD-10-CM

## 2020-11-10 DIAGNOSIS — G7000 Myasthenia gravis without (acute) exacerbation: Secondary | ICD-10-CM

## 2020-11-10 DIAGNOSIS — N400 Enlarged prostate without lower urinary tract symptoms: Secondary | ICD-10-CM | POA: Diagnosis not present

## 2020-11-10 DIAGNOSIS — T50905A Adverse effect of unspecified drugs, medicaments and biological substances, initial encounter: Secondary | ICD-10-CM

## 2020-11-10 LAB — LIPID PANEL
Cholesterol: 135 mg/dL (ref 0–200)
HDL: 68.9 mg/dL (ref >39.00)
LDL Cholesterol: 51 mg/dL (ref 0–99)
NonHDL: 66.07
Total CHOL/HDL Ratio: 2
Triglycerides: 73 mg/dL (ref 0.0–149.0)
VLDL: 14.6 mg/dL (ref 0.0–40.0)

## 2020-11-10 LAB — BASIC METABOLIC PANEL
BUN: 18 mg/dL (ref 6–23)
CO2: 28 mEq/L (ref 19–32)
Calcium: 9.4 mg/dL (ref 8.4–10.5)
Chloride: 105 mEq/L (ref 96–112)
Creatinine, Ser: 0.82 mg/dL (ref 0.40–1.50)
GFR: 91.23 mL/min (ref >60.00)
Glucose, Bld: 119 mg/dL — ABNORMAL HIGH (ref 70–99)
Potassium: 4 mEq/L (ref 3.5–5.1)
Sodium: 137 mEq/L (ref 135–145)

## 2020-11-10 LAB — CBC WITH DIFFERENTIAL/PLATELET
Basophils Absolute: 0 10*3/uL (ref 0.0–0.1)
Basophils Relative: 0.2 % (ref 0.0–3.0)
Eosinophils Absolute: 0.4 10*3/uL (ref 0.0–0.7)
Eosinophils Relative: 4.8 % (ref 0.0–5.0)
HCT: 40.8 % (ref 39.0–52.0)
Hemoglobin: 13.9 g/dL (ref 13.0–17.0)
Lymphocytes Relative: 20.8 % (ref 12.0–46.0)
Lymphs Abs: 1.6 10*3/uL (ref 0.7–4.0)
MCHC: 34 g/dL (ref 30.0–36.0)
MCV: 87 fl (ref 78.0–100.0)
Monocytes Absolute: 0.5 10*3/uL (ref 0.1–1.0)
Monocytes Relative: 7.3 % (ref 3.0–12.0)
Neutro Abs: 5.1 10*3/uL (ref 1.4–7.7)
Neutrophils Relative %: 66.9 % (ref 43.0–77.0)
Platelets: 270 10*3/uL (ref 150.0–400.0)
RBC: 4.69 Mil/uL (ref 4.22–5.81)
RDW: 15.1 % (ref 11.5–15.5)
WBC: 7.6 10*3/uL (ref 4.0–10.5)

## 2020-11-10 LAB — PSA: PSA: 0.58 ng/mL (ref 0.10–4.00)

## 2020-11-10 LAB — HEPATIC FUNCTION PANEL
ALT: 12 U/L (ref 0–53)
AST: 17 U/L (ref 0–37)
Albumin: 4.1 g/dL (ref 3.5–5.2)
Alkaline Phosphatase: 60 U/L (ref 39–117)
Bilirubin, Direct: 0.2 mg/dL (ref 0.0–0.3)
Total Bilirubin: 0.7 mg/dL (ref 0.2–1.2)
Total Protein: 6.6 g/dL (ref 6.0–8.3)

## 2020-11-10 LAB — TSH: TSH: 0.93 u[IU]/mL (ref 0.35–4.50)

## 2020-11-10 NOTE — Progress Notes (Signed)
Subjective:  Patient ID: Henry [name], male    DOB: Feb 03, 1956  Age: 65 y.o. MRN: [id number]  CC: Hypertension and Hyperlipidemia  This visit occurred during the SARS-CoV-2 public health emergency.  Safety protocols were in place, including screening questions prior to the visit, additional usage of staff PPE, and extensive cleaning of exam room while observing appropriate contact time as indicated for disinfecting solutions.    HPI Henry [name] presents for f/up -   He walks several miles a day several times a week and does not experience CP, DOE, palpitations, edema, or fatigue.  About a week ago he developed left eyelid weakness and was seen by an ophthalmologist.  His blood tests for myasthenia gravis were positive.  He also complains of chronic low back pain and diffuse weakness in his extremities.  He also has chronic numbness in his lower extremities.  He is not having any trouble breathing or swallowing.  Outpatient Medications Prior to Visit  Medication Sig Dispense Refill  . ibuprofen (ADVIL) 200 MG tablet Take 400 mg by mouth every 6 (six) hours as needed. Take 2 tablets (400mg  total) by mouth every 4 hours as needed for moderate pain.    Marland Kitchen lisinopril (ZESTRIL) 40 MG tablet TAKE 1 TABLET BY MOUTH EVERY DAY 90 tablet 1  . alendronate (FOSAMAX) 70 MG tablet Take 1 tablet (70 mg total) by mouth once a week. Take with a full glass of water on an empty stomach. 13 tablet 3  . hydrochlorothiazide (HYDRODIURIL) 25 MG tablet Take 25 mg by mouth daily.    Marland Kitchen HYDROcodone-acetaminophen (NORCO/VICODIN) 5-325 MG tablet Take 1 tablet by mouth every 4 (four) hours as needed for moderate pain. Take 1 tablet by mouth every 4 hours as needed for severe pain.    . meclizine (ANTIVERT) 25 MG tablet TAKE 1 TABLET (25 MG TOTAL) BY MOUTH 3 (THREE) TIMES DAILY AS NEEDED FOR DIZZINESS. 90 tablet 1  . methocarbamol (ROBAXIN) 500 MG tablet Take 500 mg by mouth 4 (four) times daily. Take 1 tablet by mouth four  times daily.     No facility-administered medications prior to visit.    ROS Review of Systems  Constitutional: Positive for fatigue. Negative for appetite change, diaphoresis and unexpected weight change.  HENT: Negative for trouble swallowing and voice change.   Eyes: Negative.   Respiratory: Negative for cough, chest tightness, shortness of breath and wheezing.   Cardiovascular: Negative for chest pain, palpitations and leg swelling.  Gastrointestinal: Negative for abdominal pain, constipation, diarrhea, nausea and vomiting.  Endocrine: Negative.   Genitourinary: Negative.   Musculoskeletal: Positive for back pain. Negative for myalgias.  Skin: Negative for color change.  Neurological: Positive for weakness, numbness and headaches. Negative for dizziness and seizures.  Hematological: Negative for adenopathy. Does not bruise/bleed easily.  Psychiatric/Behavioral: Negative.     Objective:  BP 124/82   Pulse 79   Temp 97.6 F (36.4 C) (Oral)   Ht 5\' 10"  (1.778 m)   Wt 159 lb (72.1 kg)   SpO2 95%   BMI 22.81 kg/m   BP Readings from Last 3 Encounters:  11/10/20 124/82  05/24/20 122/84  01/25/20 130/80    Wt Readings from Last 3 Encounters:  11/10/20 159 lb (72.1 kg)  05/24/20 157 lb (71.2 kg)  01/25/20 160 lb (72.6 kg)    Physical Exam Vitals reviewed.  HENT:     Nose: Nose normal.     Mouth/Throat:     Mouth: Mucous  membranes are moist.  Eyes:     General: No scleral icterus.    Extraocular Movements:     Right eye: Normal extraocular motion and no nystagmus.     Left eye: Normal extraocular motion and no nystagmus.     Conjunctiva/sclera: Conjunctivae normal.     Comments: + L ptosis  Cardiovascular:     Rate and Rhythm: Normal rate and regular rhythm.     Heart sounds: No murmur heard.   Pulmonary:     Effort: Pulmonary effort is normal.     Breath sounds: No stridor. No wheezing, rhonchi or rales.  Abdominal:     General: Abdomen is flat.      Palpations: There is no mass.     Tenderness: There is no abdominal tenderness.  Musculoskeletal:        General: Normal range of motion.     Cervical back: Neck supple.     Right lower leg: No edema.  Lymphadenopathy:     Cervical: No cervical adenopathy.  Skin:    General: Skin is warm and dry.  Neurological:     General: No focal deficit present.     Mental Status: Mental status is at baseline.     Motor: Weakness present.     Gait: Gait abnormal.     Deep Tendon Reflexes: Reflexes normal.  Psychiatric:        Mood and Affect: Mood normal.        Behavior: Behavior normal.     Lab Results  Component Value Date   WBC 7.6 11/10/2020   HGB 13.9 11/10/2020   HCT 40.8 11/10/2020   PLT 270.0 11/10/2020   GLUCOSE 119 (H) 11/10/2020   CHOL 135 11/10/2020   TRIG 73.0 11/10/2020   HDL 68.90 11/10/2020   LDLCALC 51 11/10/2020   ALT 12 11/10/2020   AST 17 11/10/2020   NA 137 11/10/2020   K 4.0 11/10/2020   CL 105 11/10/2020   CREATININE 0.82 11/10/2020   BUN 18 11/10/2020   CO2 28 11/10/2020   TSH 0.93 11/10/2020   PSA 0.58 11/10/2020   HGBA1C 5.0 04/13/2019    CT LUMBAR SPINE WO CONTRAST  Result Date: 04/11/2020 CLINICAL DATA:  Severe scoliosis. Lumbago of the lumbar region with sciatica. Left lower extremity pain. EXAM: CT LUMBAR SPINE WITHOUT CONTRAST TECHNIQUE: Multidetector CT imaging of the lumbar spine was performed without intravenous contrast administration. Multiplanar CT image reconstructions were also generated. COMPARISON:  MRI lumbar spine 03/31/2019 FINDINGS: Segmentation: 5 non rib-bearing lumbar type vertebral bodies are present. The lowest fully formed vertebral body is L5. Alignment: Slight degenerative retrolisthesis is present at L2-3 at L3-4. Severe levoconvex scoliosis is centered at L3-4. Vertebrae: Asymmetric sclerotic endplate changes are noted at L4-5 on the right at L3-4. Left-sided sclerotic changes are noted at L5-S1. Prominent anterior Schmorl's  nodes are present at T12-L1 and L1-2 with significant loss of height in the anterior aspect of the L1 vertebral body. Vertebral body heights are otherwise normal. Paraspinal and other soft tissues: Limited imaging the abdomen is unremarkable. There is no significant adenopathy. No solid organ lesions are present. Atherosclerotic changes are present at the aorta and branch vessels without aneurysm. Disc levels: Left-sided disease is greatest at L4-5, as before. A broad-based disc protrusion is present. Facet hypertrophy and ligamentum flavum thickening leads to moderate central canal stenosis. Severe right and moderate left foraminal narrowing is present. Moderate to severe left foraminal narrowing is present at L5-S1. Right greater than  left subarticular and foraminal stenosis is present at L2-3 and L3-4. IMPRESSION: 1. Severe levoconvex scoliosis of the lumbar spine is centered at L3-4. 2. Multilevel spondylosis of the lumbar spine as described. 3. Severe right and moderate left foraminal narrowing at L4-5 and moderate to severe left foraminal narrowing at L5-S1. 4. 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 5. Aortic Atherosclerosis (ICD10-I70.0). Electronically Signed   By: San Morelle M.D.   On: 04/11/2020 15:14    Assessment & Plan:   Dierre was seen today for hypertension and hyperlipidemia.  Diagnoses and all orders for this visit:  Essential hypertension- His blood pressure is adequately well controlled. -     CBC with Differential/Platelet; Future -     Basic metabolic panel; Future -     TSH; Future -     TSH -     Basic metabolic panel -     CBC with Differential/Platelet  Hyperlipidemia LDL goal <130- His ASCVD risk score is <10%. 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  Myasthenia gravis [facility])- He knows to go to the ED if his condition worsens. -     Ambulatory referral to Neurology   I have discontinued [name]. Beazley's meclizine, hydrochlorothiazide, methocarbamol, HYDROcodone-acetaminophen, and alendronate. I am also having him maintain his ibuprofen and lisinopril.  No orders of the defined types were placed in this encounter.    Follow-up: Return in about 6 months (around 05/13/2021).  [name], MD

## 2020-11-10 NOTE — Patient Instructions (Signed)
Myasthenia Gravis Myasthenia gravis (MG) is a long-term (chronic) condition that causes weakness in the muscles you can control (voluntary muscles). MG can affect any voluntary muscle. The muscles most often affected are the ones that control:  Eye movement.  Facial movements.  Swallowing. MG is a disease in which the body's disease-fighting system (immune system) attacks its own healthy tissues (autoimmune disease). When you have MG, your immune system makes proteins (antibodies) that block a chemical (acetylcholine) that your body needs to send nerve signals to your muscles. This causes muscle weakness. What are the causes? The exact cause of MG is not known. What increases the risk? The following factors may make you more likely to develop this condition:  Having an enlarged thymus gland. The thymus gland is located under the breastbone. It makes certain cells for the immune system.  Having a family history of MG.   What are the signs or symptoms? Symptoms of MG may include:  Drooping eyelids.  Double vision.  Muscle weakness that gets worse with activity and gets better after rest.  Difficulty walking.  Trouble chewing and swallowing.  Trouble making facial expressions.  Slurred speech.  Weakness of the arms, hands, and legs. Sudden, severe difficulty breathing (myasthenic crisis) may develop after having:  An infection.  A fever.  A bad reaction to a medicine. Myasthenic crisis requires emergency breathing support. Sometimes symptoms of MG go away for a while (remission) and then come back later. How is this diagnosed? This condition may be diagnosed based on:  Your symptoms and medical history.  A physical exam.  Blood tests.  Tests of your muscle strength and function.  Imaging tests, such as a CT scan or an MRI. How is this treated? The goal of treatment is to improve muscle strength. Treatment may include:  Taking medicine.  Making lifestyle  changes that focus on saving your energy.  Doing physical therapy to gain strength.  Having surgery to remove the thymus gland (thymectomy). This may result in a long remission for some people.  Having a procedure to remove the acetylcholine antibodies (plasmapheresis).  Getting emergency breathing support, if you experience myasthenic crisis. If you experience remission, you may be able to stop treatment and then resume treatment when your symptoms return. Follow these instructions at home:  Take over-the-counter and prescription medicines only as told by your health care provider.  Get plenty of rest and sleep. Take frequent breaks to rest your eyes, especially when in bright light or working on a computer.  Maintain a healthy diet and a healthy weight. Work with your health care provider or a diet and nutrition specialist (dietitian) if you need help.  Do exercises as told by your health care provider or physical therapist.  Do not use any products that contain nicotine or tobacco, such as cigarettes and e-cigarettes. If you need help quitting, ask your health care provider.  Prevent infections by: ? Washing your hands often with soap and water. If soap and water are not available, use hand sanitizer. ? Avoiding contact with other people who are sick. ? Avoiding touching your eyes, nose, and mouth. ? Cleaning surfaces in your home that are touched often using a disinfectant.  Keep all follow-up visits as told by your health care provider. This is important.   Contact a health care provider if:  Your symptoms change or get worse, especially after having a fever or infection. Get help right away if:  You have trouble breathing. Summary  Myasthenia  gravis (MG) is a long-term (chronic) condition that causes weakness in the muscles you can control (voluntary muscles).  A symptom of MG is muscle weakness that gets worse with activity and gets better after rest.  Sudden, severe  difficulty breathing (myasthenic crisis) may develop after having an infection, a fever, or a bad reaction to a medicine.  The goal of treatment is to improve muscle strength. Treatment may include medicines, lifestyle changes, physical therapy, surgery, plasmapheresis, or emergency breathing support. This information is not intended to replace advice given to you by your health care provider. Make sure you discuss any questions you have with your health care provider. Document Revised: 05/13/2020 Document Reviewed: 05/13/2020 Elsevier Patient Education  2021 [employer].

## 2020-11-16 ENCOUNTER — Telehealth: Payer: Self-pay | Admitting: Internal Medicine

## 2020-11-16 NOTE — Telephone Encounter (Signed)
Patient said he received a call from the office to make an appointment with PCP. Do not see any documentation where anyone called. Please advise

## 2020-11-17 ENCOUNTER — Telehealth: Payer: Self-pay | Admitting: Neurology

## 2020-11-17 ENCOUNTER — Ambulatory Visit: Payer: Medicare Other | Admitting: Internal Medicine

## 2020-11-17 ENCOUNTER — Encounter: Payer: Self-pay | Admitting: Neurology

## 2020-11-17 ENCOUNTER — Ambulatory Visit (INDEPENDENT_AMBULATORY_CARE_PROVIDER_SITE_OTHER): Payer: Medicare Other | Admitting: Neurology

## 2020-11-17 ENCOUNTER — Telehealth: Payer: Self-pay | Admitting: *Deleted

## 2020-11-17 VITALS — BP 154/87 | HR 65 | Ht 70.0 in | Wt 159.5 lb

## 2020-11-17 DIAGNOSIS — H8110 Benign paroxysmal vertigo, unspecified ear: Secondary | ICD-10-CM | POA: Diagnosis not present

## 2020-11-17 DIAGNOSIS — H02402 Unspecified ptosis of left eyelid: Secondary | ICD-10-CM | POA: Diagnosis not present

## 2020-11-17 DIAGNOSIS — G7000 Myasthenia gravis without (acute) exacerbation: Secondary | ICD-10-CM

## 2020-11-17 MED ORDER — PYRIDOSTIGMINE BROMIDE 60 MG PO TABS
60.0000 mg | ORAL_TABLET | Freq: Three times a day (TID) | ORAL | 5 refills | Status: DC
Start: 2020-11-17 — End: 2021-05-11

## 2020-11-17 NOTE — Telephone Encounter (Signed)
Pt stated that it was a called from Endocrinology.

## 2020-11-17 NOTE — Telephone Encounter (Signed)
Medicare/aarp no auth. Patient is scheduled at GI for 11/23/20.

## 2020-11-17 NOTE — Telephone Encounter (Signed)
Received OV note/lab results. Gave to MD to review.

## 2020-11-17 NOTE — Telephone Encounter (Signed)
[place] at [phone]. They will fax last OV note and labs recently completed (make sure to include acetylcholine receptor antibodies if done) to [phone]. Waiting on fax.

## 2020-11-17 NOTE — Telephone Encounter (Signed)
I have not contacted pt. Have you attempted to contact this pt?

## 2020-11-17 NOTE — Progress Notes (Signed)
[facility]  PATIENT: Henry [name] DOB: [birth year]   REFERRING DOCTOR OR PCP:   Theodosia Quay, PA-C [facility] ophthalmology); Dr. [name] )(PCP) SOURCE: patient, lab reports, notes from PCP  _________________________________   HISTORICAL  CHIEF COMPLAINT:  Chief Complaint  Patient presents with  . New Patient (Initial Visit)    RM 13, alone. Internal referral for MG. Left eye lid started drooping a few weeks ago. Saw eye doctor, went to PCP who then referred him to [place]. Seeing endocrinology Monday at 10:30am as well.    HISTORY OF PRESENT ILLNESS:  I had the pleasure of seeing patient, Henry [name], at [facility] for neurologic consultation regarding his left ptosis and recent diagnosis of myasthenia gravis.  Mr. Diemer is a 65 year old man who had the onset of left ptosis in mid February 2022.   While driving that day, he noticed the ptosis, especially when light was brighter.   He saw Dr. Katy Fitch who was concerned about myasthenia gravis.   He returned a few days later and had an ice pack test which improved the ocular symptoms.    He notes eyes are not focusing well.   He has reduced focusing more than ptosis.     He had myasthenia gravis labs performed and was told that they were abnormal.    Compared to a few weeks ago, he feels he is doing the same.      Currently, he notes the ptosis and reduced focusing but no other weakness.  Specifically, he denies dysphagia or shortness of breath.    He denies difficulty rising up from a chair or holding his arms over his head.      He feels balance is off at times and he sometimes has vertigo.  The vertigo started last year and is episodic.   A typical spell lasts a minute or two and he stumbles if standing at the time.   Looking up will trigger a spell at times.     He is on a weekly pill for osteoporosis.  He had hep C in the past that was cured with treatment..    He has had cervical fusion C4-C7  04/2019 (Dr. Saintclair Halsted).   He has scoliosis and may need additional surgery.   He has  some arthritic issues in knees and shoulders.  He is active and walks 3 times a week and rides bicycles and goes dancing.    Recent labs 11/10/20 labs showed TSH, CBC, BMP were normal or non-contributary.  05/24/2020 labs shoed Vit D was mildly low (26) 10/25/2020 AChR binding and blocking antibodies are both elevated (binding 5.46 , < 0.4 normal); blocking 40, < 30 normal)  REVIEW OF SYSTEMS: Constitutional: No fevers, chills, sweats, or change in appetite Eyes: No visual changes, double vision, eye pain Ear, nose and throat: No hearing loss, ear pain, nasal congestion, sore throat Cardiovascular: No chest pain, palpitations Respiratory: No shortness of breath at rest or with exertion.   No wheezes GastrointestinaI: No nausea, vomiting, diarrhea, abdominal pain, fecal incontinence Genitourinary: No dysuria, urinary retention or frequency.  No nocturia. Musculoskeletal:Notes arthritis in shoulders and knees.  Has osteoporosis. Integumentary: No rash, pruritus, skin lesions Neurological: as above Psychiatric: No depression at this time.  No anxiety Endocrine: No palpitations, diaphoresis, change in appetite, change in weigh or increased thirst Hematologic/Lymphatic: No anemia, purpura, petechiae. Allergic/Immunologic: No itchy/runny eyes, nasal congestion, recent allergic reactions, rashes  ALLERGIES: Allergies  Allergen Reactions  .  Valacyclovir Rash    HOME MEDICATIONS:  Current Outpatient Medications:  .  ibuprofen (ADVIL) 200 MG tablet, Take 400 mg by mouth every 6 (six) hours as needed. Take 2 tablets (400mg  total) by mouth every 4 hours as needed for moderate pain., Disp: , Rfl:  .  lisinopril (ZESTRIL) 40 MG tablet, TAKE 1 TABLET BY MOUTH EVERY DAY, Disp: 90 tablet, Rfl: 1 .  pyridostigmine (MESTINON) 60 MG tablet, Take 1 tablet (60 mg total) by mouth 3 (three) times daily., Disp: 90 tablet, Rfl:  5 .  traMADol (ULTRAM) 50 MG tablet, Take 50 mg by mouth every 6 (six) hours as needed., Disp: , Rfl:   PAST MEDICAL HISTORY: Past Medical History:  Diagnosis Date  . Allergy   . Anemia   . Arthritis   . Depression    due to long tern hepatitis C - once the Hep C was cured, had not any issues until last year November 29, 2017) when is wife died  . History of hepatitis C    has been treated in 11/30/03  . Hypertension   . Pneumonia    as a teenager  . Scoliosis     PAST SURGICAL HISTORY: Past Surgical History:  Procedure Laterality Date  . ANTERIOR CERVICAL DECOMP/DISCECTOMY FUSION N/A 04/13/2019   Procedure: ANTERIOR CERVICAL DECOMPRESSION/DISCECTOMY FUSION CERVICAL FOUR-FIVE, CERVICAL FIVE-SIX, CERVICAL SIX-SEVEN;  Surgeon: Kary Kos, MD;  Location: [facility];  Service: Neurosurgery;  Laterality: N/A;  . COLONOSCOPY    . RADIOLOGY WITH ANESTHESIA N/A 03/31/2019   Procedure: MRI WITH ANESTHESIA LUMBAR WITHOUT CONTRAST AND CERVICAL WITHOUT CONTRAST;  Surgeon: Radiologist, Medication, MD;  Location: [facility];  Service: Radiology;  Laterality: N/A;  . TONSILLECTOMY AND ADENOIDECTOMY      FAMILY HISTORY: Family History  Problem Relation Age of Onset  . Hypertension Mother   . Hypertension Father   . Alcoholism Brother     SOCIAL HISTORY:  Social History   Socioeconomic History  . Marital status: Married    Spouse name: Not on file  . Number of children: Not on file  . Years of education: Not on file  . Highest education level: Not on file  Occupational History  . Not on file  Tobacco Use  . Smoking status: Former Smoker    Years: 24.00  . Smokeless tobacco: Never Used  . Tobacco comment: light smoker when I smoked  Vaping Use  . Vaping Use: Never used  Substance and Sexual Activity  . Alcohol use: Yes    Alcohol/week: 35.0 standard drinks    Types: 21 Cans of beer, 14 Standard drinks or equivalent per week    Comment: 2-3 drinks per day  . Drug use: Yes    Types: Marijuana     Comment: every once in a while per pt  . Sexual activity: Not Currently  Other Topics Concern  . Not on file  Social History Narrative   Right handed   1 cup coffee every morning, ice tea w/ lunch, no soda   Social Determinants of Health   Financial Resource Strain: Not on file  Food Insecurity: Not on file  Transportation Needs: Not on file  Physical Activity: Not on file  Stress: Not on file  Social Connections: Not on file  Intimate Partner Violence: Not on file     PHYSICAL EXAM  Vitals:   11/17/20 0849  BP: (!) 154/87  Pulse: 65  Weight: 159 lb 8 oz (72.3 kg)  Height: 5\' 10"  (1.778 m)  Body mass index is 22.89 kg/m.   General: The patient is well-developed and well-nourished and in no acute distress  HEENT:  Head is [place]/AT.  Sclera are anicteric.  Funduscopic exam shows normal optic discs and retinal vessels.  Neck: No carotid bruits are noted.  The neck is nontender.  Cardiovascular: The heart has a regular rate and rhythm with a normal S1 and S2. There were no murmurs, gallops or rubs.    Skin: Extremities are without rash or  edema.  Musculoskeletal:  Back is nontender  Neurologic Exam  Mental status: The patient is alert and oriented x 3 at the time of the examination. The patient has apparent normal recent and remote memory, with an apparently normal attention span and concentration ability.   Speech is normal.  Cranial nerves: He has ptosis on the left that worsens with sustained upgaze.  Extraocular muscles were intact.. Pupils are equal, round, and reactive to light and accomodation. Facial symmetry is present. There is good facial sensation to soft touch bilaterally.Facial strength is normal.  Trapezius and sternocleidomastoid strength is normal. No dysarthria is noted.  The tongue is midline, and the patient has symmetric elevation of the soft palate. No obvious hearing deficits are noted.  Motor:  Muscle bulk is normal.   Tone is normal. Strength  is  5 / 5 in all 4 extremities.   Sensory: Sensory testing is intact to pinprick, soft touch and vibration sensation in all 4 extremities.  Coordination: Cerebellar testing reveals good finger-nose-finger and heel-to-shin bilaterally.  Gait and station: Station is normal.   Gait is normal. Tandem gait is normal. Romberg is negative.   Reflexes: Deep tendon reflexes are symmetric and normal bilaterally.   Plantar responses are flexor.  Dix-Hallpike maneuvers did not evoke nystagmus though the maneuver towards the left did cause mild vertigo     DIAGNOSTIC DATA (LABS, IMAGING, TESTING) - I reviewed patient records, labs, notes, testing and imaging myself where available.  Lab Results  Component Value Date   WBC 7.6 11/10/2020   HGB 13.9 11/10/2020   HCT 40.8 11/10/2020   MCV 87.0 11/10/2020   PLT 270.0 11/10/2020      Component Value Date/Time   NA 137 11/10/2020 1528   K 4.0 11/10/2020 1528   CL 105 11/10/2020 1528   CO2 28 11/10/2020 1528   GLUCOSE 119 (H) 11/10/2020 1528   BUN 18 11/10/2020 1528   CREATININE 0.82 11/10/2020 1528   CREATININE 0.85 05/24/2020 1059   CALCIUM 9.4 11/10/2020 1528   PROT 6.6 11/10/2020 1528   ALBUMIN 4.1 11/10/2020 1528   AST 17 11/10/2020 1528   ALT 12 11/10/2020 1528   ALKPHOS 60 11/10/2020 1528   BILITOT 0.7 11/10/2020 1528   GFRNONAA >60 03/31/2019 0714   GFRAA >60 03/31/2019 0714   Lab Results  Component Value Date   CHOL 135 11/10/2020   HDL 68.90 11/10/2020   LDLCALC 51 11/10/2020   TRIG 73.0 11/10/2020   CHOLHDL 2 11/10/2020   Lab Results  Component Value Date   HGBA1C 5.0 04/13/2019   Lab Results  Component Value Date   PNTIRWER15 400 (H) 11/17/2018   Lab Results  Component Value Date   TSH 0.93 11/10/2020       ASSESSMENT AND PLAN  Myasthenia gravis ([place]) - Plan: CT CHEST W CONTRAST  Benign paroxysmal positional vertigo, unspecified laterality  Ptosis of eyelid, left   In summary, Mr. Sharp is a  65 year old man who had the onset  of left ptosis several weeks ago and recently tested positive for the acetylcholine receptor binding and blocking antibodies.  Besides the ptosis, he did not have other weakness.  This is consistent with ocular myasthenia gravis and I went over the treatment plan with Mr. Campione.   I will start Mestinon and have him titrate up to 60 mg p.o. 3 times daily.  If that is insufficient to help with the myasthenia gravis, we will need to add an immunotherapy.  He has osteoporosis so I would be reluctant to add steroids.  We could initiate either Imuran or CellCept if needed.  Additionally we could consider IVIG to give some benefit until the immunosuppressive therapy becomes effective.  A second problem was vertigo and I instructed him on the Brandt-Daroff exercises.  He will return to see me in 6 to 8 weeks but sooner if there are new or worsening neurologic symptoms.   Berea Majkowski A. Felecia Shelling, MD, [facility] 02/28/9470, 25:27 PM Certified in Neurology, Clinical Neurophysiology, Sleep Medicine and Neuroimaging  [facility] [address], [place], [place] [address] [phone]

## 2020-11-17 NOTE — Telephone Encounter (Signed)
It was probably a call from neurology

## 2020-11-17 NOTE — Telephone Encounter (Signed)
Error

## 2020-11-18 ENCOUNTER — Other Ambulatory Visit: Payer: Self-pay

## 2020-11-21 ENCOUNTER — Encounter: Payer: Self-pay | Admitting: Internal Medicine

## 2020-11-21 ENCOUNTER — Other Ambulatory Visit: Payer: Self-pay

## 2020-11-21 ENCOUNTER — Ambulatory Visit (INDEPENDENT_AMBULATORY_CARE_PROVIDER_SITE_OTHER): Payer: Medicare Other | Admitting: Internal Medicine

## 2020-11-21 VITALS — BP 158/92 | HR 69 | Ht 70.0 in | Wt 159.0 lb

## 2020-11-21 DIAGNOSIS — M810 Age-related osteoporosis without current pathological fracture: Secondary | ICD-10-CM

## 2020-11-21 MED ORDER — ALENDRONATE SODIUM 70 MG PO TABS: 70.0000 mg | ORAL_TABLET | ORAL | 3 refills | Status: DC

## 2020-11-21 NOTE — Progress Notes (Signed)
Name: Henry [name]  MRN/ DOB: [id number], [birth year]    Age/ Sex: 65 y.o., male     PCP: Janith Lima, MD   Reason for Endocrinology Evaluation: Osteporosis     Initial Endocrinology Clinic Visit: 9/14/21021    PATIENT IDENTIFIER: [name] is a 65 y.o., male with a past medical history of Osteoporosis, Myasthenia Gravis and DJD. He has followed with [facility] since 05/24/2020 for consultative assistance with management of his Osteoporosis .   HISTORICAL SUMMARY:  Pt was diagnosed with Osteoporosis 02/26/2019: Fracture Hx: no FH of osteoporosis or hip fracture: no Prior Hx of anti-resorptive therapy : no   DXA 02/2019 showed a T-score of -2.5 of the spine.   We started Caclium, vitamin D and Alendronate 05/2020 SUBJECTIVE:    Today (11/21/2020):  Henry [name] is here for a follow up on Osteoporosis  He tells me he is on vitamins as prescribed   He is compliant with Alendronate  Denies heartburn  Denies constipation  No bone fractures     Has myasthenia Gravis , currently on medication that is causing GI side effects        HOME ENDOCRINE MEDICATION Calcium 600 mg BID Vitamin D 1000 iu daily  Alendronate 70 mg daily        HISTORY:  Past Medical History:  Past Medical History:  Diagnosis Date  . Allergy   . Anemia   . Arthritis   . Depression    due to long tern hepatitis C - once the Hep C was cured, had not any issues until last year 2017-12-11) when is wife died  . History of hepatitis C    has been treated in 12-Dec-2003  . Hypertension   . Pneumonia    as a teenager  . Scoliosis    Past Surgical History:  Past Surgical History:  Procedure Laterality Date  . ANTERIOR CERVICAL DECOMP/DISCECTOMY FUSION N/A 04/13/2019   Procedure: ANTERIOR CERVICAL DECOMPRESSION/DISCECTOMY FUSION CERVICAL FOUR-FIVE, CERVICAL FIVE-SIX, CERVICAL SIX-SEVEN;  Surgeon: Kary Kos, MD;  Location: [facility];  Service: Neurosurgery;  Laterality: N/A;  .  COLONOSCOPY    . RADIOLOGY WITH ANESTHESIA N/A 03/31/2019   Procedure: MRI WITH ANESTHESIA LUMBAR WITHOUT CONTRAST AND CERVICAL WITHOUT CONTRAST;  Surgeon: Radiologist, Medication, MD;  Location: [facility];  Service: Radiology;  Laterality: N/A;  . TONSILLECTOMY AND ADENOIDECTOMY      Social History:  reports that he has quit smoking. He quit after 24.00 years of use. He has never used smokeless tobacco. He reports current alcohol use of about 35.0 standard drinks of alcohol per week. He reports current drug use. Drug: Marijuana. Family History:  Family History  Problem Relation Age of Onset  . Hypertension Mother   . Hypertension Father   . Alcoholism Brother      HOME MEDICATIONS: Allergies as of 11/21/2020      Reactions   Valacyclovir Rash      Medication List       Accurate as of November 21, 2020 11:13 AM. If you have any questions, ask your nurse or doctor.        alendronate 70 MG tablet Commonly known as: FOSAMAX Take 1 tablet (70 mg total) by mouth every 7 (seven) days. Take with a full glass of water on an empty stomach. Started by: Dorita Sciara, MD   ibuprofen 200 MG tablet Commonly known as: ADVIL Take 400 mg by mouth every 6 (six) hours as needed. Take  2 tablets (400mg  total) by mouth every 4 hours as needed for moderate pain.   lisinopril 40 MG tablet Commonly known as: ZESTRIL TAKE 1 TABLET BY MOUTH EVERY DAY   pyridostigmine 60 MG tablet Commonly known as: Mestinon Take 1 tablet (60 mg total) by mouth 3 (three) times daily.   traMADol 50 MG tablet Commonly known as: ULTRAM Take 50 mg by mouth every 6 (six) hours as needed.         OBJECTIVE:   PHYSICAL EXAM: VS: BP (!) 158/92   Pulse 69   Ht 5\' 10"  (1.778 m)   Wt 159 lb (72.1 kg)   SpO2 98%   BMI 22.81 kg/m    EXAM: General: Pt appears well and is in NAD  Lungs: Clear with good BS bilat with no rales, rhonchi, or wheezes  Heart: Auscultation: RRR.  Abdomen: Normoactive bowel  sounds, soft, nontender, without masses or organomegaly palpable  Extremities:  BL LE: No pretibial edema normal ROM and strength.  Mental Status: Judgment, insight: Intact Orientation: Oriented to time, place, and person Memory: Intact for recent and remote events Mood and affect: No depression, anxiety, or agitation     DATA REVIEWED: Results for Henry [name], Henry [name] (MRN [id number]) as of 11/21/2020 10:18  Ref. Range 11/10/2020 15:28  Sodium Latest Ref Range: 135 - 145 mEq/L 137  Potassium Latest Ref Range: 3.5 - 5.1 mEq/L 4.0  Chloride Latest Ref Range: 96 - 112 mEq/L 105  CO2 Latest Ref Range: 19 - 32 mEq/L 28  Glucose Latest Ref Range: 70 - 99 mg/dL 119 (H)  BUN Latest Ref Range: 6 - 23 mg/dL 18  Creatinine Latest Ref Range: 0.40 - 1.50 mg/dL 0.82  Calcium Latest Ref Range: 8.4 - 10.5 mg/dL 9.4  Alkaline Phosphatase Latest Ref Range: 39 - 117 U/L 60  Albumin Latest Ref Range: 3.5 - 5.2 g/dL 4.1  AST Latest Ref Range: 0 - 37 U/L 17  ALT Latest Ref Range: 0 - 53 U/L 12  Total Protein Latest Ref Range: 6.0 - 8.3 g/dL 6.6  Bilirubin, Direct Latest Ref Range: 0.0 - 0.3 mg/dL 0.2  Total Bilirubin Latest Ref Range: 0.2 - 1.2 mg/dL 0.7  GFR Latest Ref Range: >60.00 mL/min 91.23  Total CHOL/HDL Ratio Unknown 2  Cholesterol Latest Ref Range: 0 - 200 mg/dL 135  HDL Cholesterol Latest Ref Range: >39.00 mg/dL 68.90  LDL (calc) Latest Ref Range: 0 - 99 mg/dL 51  NonHDL Unknown 66.07  Triglycerides Latest Ref Range: 0.0 - 149.0 mg/dL 73.0  VLDL Latest Ref Range: 0.0 - 40.0 mg/dL 14.6  WBC Latest Ref Range: 4.0 - 10.5 K/uL 7.6  RBC Latest Ref Range: 4.22 - 5.81 Mil/uL 4.69  Hemoglobin Latest Ref Range: 13.0 - 17.0 g/dL 13.9  HCT Latest Ref Range: 39.0 - 52.0 % 40.8  MCV Latest Ref Range: 78.0 - 100.0 fl 87.0  MCHC Latest Ref Range: 30.0 - 36.0 g/dL 34.0  RDW Latest Ref Range: 11.5 - 15.5 % 15.1  Platelets Latest Ref Range: 150.0 - 400.0 K/uL 270.0  Neutrophils Latest Ref Range: 43.0 - 77.0 %  66.9  Lymphocytes Latest Ref Range: 12.0 - 46.0 % 20.8  Monocytes Relative Latest Ref Range: 3.0 - 12.0 % 7.3  Eosinophil Latest Ref Range: 0.0 - 5.0 % 4.8  Basophil Latest Ref Range: 0.0 - 3.0 % 0.2  NEUT# Latest Ref Range: 1.4 - 7.7 K/uL 5.1  Lymphocyte # Latest Ref Range: 0.7 - 4.0 K/uL 1.6  Monocyte #  Latest Ref Range: 0.1 - 1.0 K/uL 0.5  Eosinophils Absolute Latest Ref Range: 0.0 - 0.7 K/uL 0.4  Basophils Absolute Latest Ref Range: 0.0 - 0.1 K/uL 0.0  TSH Latest Ref Range: 0.35 - 4.50 uIU/mL 0.93  PSA Latest Ref Range: 0.10 - 4.00 ng/mL 0.58     ASSESSMENT / PLAN / RECOMMENDATIONS:   1. Osteoporosis:  - Tolerating Alendronate without side effects  - Its too soon to repeat DXA as we just started antiresorptive therapy in 05/2020. Will wait close to a total of 2 yrs on Alendronate  - NO changed today    Medications   Calcium 600 mg BID Vitamin D 1000 iu daily  Alendronate 70 mg daily   F/U in 1 yr   Signed electronically by: Mack Guise, MD  [facility] End[facility]  [address], [place] [address] Phone: [phone] FAX: [phone]      CC: Janith Lima, MD [place] [address] Phone: [phone]  Fax: [phone]   Return to Endocrinology clinic as below: Future Appointments  Date Time Provider [place]  11/23/2020  2:00 PM GI-315 CT 1 GI-315CT [facility]  01/02/2021  3:00 PM Sater, Nanine Means, MD [facility] None  02/22/2021 11:15 AM Lavonna Monarch, MD [facility]

## 2020-11-21 NOTE — Patient Instructions (Addendum)
Calcium 600 mg BID Vitamin D 1000 iu daily  Alendronate 70 mg daily

## 2020-11-23 ENCOUNTER — Other Ambulatory Visit: Payer: Self-pay

## 2020-11-23 ENCOUNTER — Ambulatory Visit
Admission: RE | Admit: 2020-11-23 | Discharge: 2020-11-23 | Disposition: A | Discharge status: Home / Self Care | Payer: Medicare Other | Source: Ambulatory Visit | Attending: Neurology | Admitting: Neurology

## 2020-11-23 DIAGNOSIS — G7000 Myasthenia gravis without (acute) exacerbation: Secondary | ICD-10-CM

## 2020-11-23 MED ORDER — IOPAMIDOL (ISOVUE-300) INJECTION 61%
75.0000 mL | Freq: Once | INTRAVENOUS | Status: AC | PRN
  Administered 2020-11-23: 75 mL via INTRAVENOUS

## 2020-11-28 ENCOUNTER — Telehealth: Payer: Self-pay | Admitting: *Deleted

## 2020-11-28 DIAGNOSIS — I350 Nonrheumatic aortic (valve) stenosis: Secondary | ICD-10-CM

## 2020-11-28 DIAGNOSIS — I700 Atherosclerosis of aorta: Secondary | ICD-10-CM

## 2020-11-28 NOTE — Telephone Encounter (Signed)
-----   Message from Britt Bottom, MD sent at 11/28/2020  5:08 PM EDT ----- The CT scan did not show any thymoma (the benign tumor sometimes associated with myasthenia gravis)  However, did show calcium in the aortic valve.  This can be associated with aortic stenosis.  Does he have a cardiologist?  If not, I would like him to see cardiology for further evaluation as he may need additional tests like echocardiogram

## 2020-11-28 NOTE — Telephone Encounter (Signed)
Called Henry [name] and relayed results per Dr. Garth Bigness note. Henry [name] verbalized understanding. He has never seen cardiologist in the past. No preference on who he goes to other than he would like to go somewhere in [facility] system. Advised he should be called to make appt in next 1-2 weeks. He should call us back if he does not hear about making appt. He verbalized understanding.

## 2020-12-09 ENCOUNTER — Other Ambulatory Visit: Payer: Self-pay | Admitting: Internal Medicine

## 2020-12-09 DIAGNOSIS — I10 Essential (primary) hypertension: Secondary | ICD-10-CM

## 2021-01-02 ENCOUNTER — Ambulatory Visit: Payer: Medicare Other | Admitting: Neurology

## 2021-01-03 NOTE — Progress Notes (Signed)
Cardiology Office Note:    Date:  01/04/2021   ID:  Henry [name], DOB 05-28-1956, MRN [id number]  PCP:  Etta Grandchild, MD  Cardiologist:  No primary care provider on file.   Referring MD: Asa Lente, MD   Chief Complaint  Patient presents with  . Hypertension  . Coronary Artery Disease    History of Present Illness:    Henry [name] is a 66 y.o. male with a hx of myasthenia gravis, osteoporosis, primary hypertension, calcified coronary arteries and aortic valve.  Recently diagnosed with myasthenia gravis.  CT scan of the chest was done looking for thymoma and identified aortic valve calcification and calcified coronary arteries.  He is asymptomatic.  He gets greater than 150 minutes of moderate activity per week by walking and stationary biking.  He is not on statin therapy but has genetically low LDL and triglycerides.  Total cholesterol is 140 range.  He does not smoke cigarettes.  He does have hypertension.  Diuretic therapy was recently discontinued because he has osteoporosis.  He is now on max dose lisinopril.  He needs additional therapy added to get the blood pressure into the target range of 130/80 mmHg or less.  He has never had a glycemic control issue.  No family history of CAD.  A sister his mother both had arrhythmias.  Father has COPD.  Patient smokes cigarettes for less than 10 years, low volume and discontinued smoking 35 years ago.  Past Medical History:  Diagnosis Date  . Allergy   . Anemia   . Arthritis   . Depression    due to long tern hepatitis C - once the Hep C was cured, had not any issues until last year 12-30-2017) when is wife died  . History of hepatitis C    has been treated in 31-Dec-2003  . Hypertension   . Pneumonia    as a teenager  . Scoliosis     Past Surgical History:  Procedure Laterality Date  . ANTERIOR CERVICAL DECOMP/DISCECTOMY FUSION N/A 04/13/2019   Procedure: ANTERIOR CERVICAL DECOMPRESSION/DISCECTOMY FUSION CERVICAL FOUR-FIVE,  CERVICAL FIVE-SIX, CERVICAL SIX-SEVEN;  Surgeon: Donalee Citrin, MD;  Location: [facility] OR;  Service: Neurosurgery;  Laterality: N/A;  . COLONOSCOPY    . RADIOLOGY WITH ANESTHESIA N/A 03/31/2019   Procedure: MRI WITH ANESTHESIA LUMBAR WITHOUT CONTRAST AND CERVICAL WITHOUT CONTRAST;  Surgeon: Radiologist, Medication, MD;  Location: [facility] OR;  Service: Radiology;  Laterality: N/A;  . TONSILLECTOMY AND ADENOIDECTOMY      Current Medications: No outpatient medications have been marked as taking for the 01/04/21 encounter (Office Visit) with Lyn Records, MD.     Allergies:   Valacyclovir   Social History   Socioeconomic History  . Marital status: Married    Spouse name: Not on file  . Number of children: Not on file  . Years of education: Not on file  . Highest education level: Not on file  Occupational History  . Not on file  Tobacco Use  . Smoking status: Former Smoker    Years: 24.00  . Smokeless tobacco: Never Used  . Tobacco comment: light smoker when I smoked  Vaping Use  . Vaping Use: Never used  Substance and Sexual Activity  . Alcohol use: Yes    Alcohol/week: 35.0 standard drinks    Types: 21 Cans of beer, 14 Standard drinks or equivalent per week    Comment: 2-3 drinks per day  . Drug use: Yes    Types:  Marijuana    Comment: every once in a while per pt  . Sexual activity: Not Currently  Other Topics Concern  . Not on file  Social History Narrative   Right handed   1 cup coffee every morning, ice tea w/ lunch, no soda   Social Determinants of Health   Financial Resource Strain: Not on file  Food Insecurity: Not on file  Transportation Needs: Not on file  Physical Activity: Not on file  Stress: Not on file  Social Connections: Not on file     Family History: The patient's family history includes Alcoholism in his brother; Hypertension in his father and mother.  ROS:   Please see the history of present illness.    Scoliosis is his major medical issue along with  recently diagnosed myasthenia gravis.  All other systems reviewed and are negative.  EKGs/Labs/Other Studies Reviewed:    The following studies were reviewed today:  CHEST CT SCAN 11/2015: FINDINGS: Cardiovascular: Atherosclerotic calcification of the aortic valve and coronary arteries. Heart size normal. No pericardial effusion.  Mediastinum/Nodes: No pathologically enlarged mediastinal, hilar or axillary lymph nodes. No prevascular mass. Esophagus is grossly unremarkable. Large hiatal hernia.  Lungs/Pleura: Scarring in the lingula and left lower lobe. Lungs are otherwise clear. No pleural fluid. Airway is unremarkable.  Upper Abdomen: Visualized portions of the liver, adrenal glands, kidneys, spleen, pancreas, stomach and bowel are unremarkable with the exception of a large hiatal hernia. No upper abdominal adenopathy.  Musculoskeletal: Degenerative changes and scoliosis in the spine. Old right rib fractures.  IMPRESSION: 1. No evidence of a thymoma. 2. Large hiatal hernia. 3. Coronary artery and aortic calcification.    EKG:  EKG prominent voltage, sinus rhythm, no repolarization abnormality.  Recent Labs: 11/10/2020: ALT 12; BUN 18; Creatinine, Ser 0.82; Hemoglobin 13.9; Platelets 270.0; Potassium 4.0; Sodium 137; TSH 0.93  Recent Lipid Panel    Component Value Date/Time   CHOL 135 11/10/2020 1528   TRIG 73.0 11/10/2020 1528   HDL 68.90 11/10/2020 1528   CHOLHDL 2 11/10/2020 1528   VLDL 14.6 11/10/2020 1528   LDLCALC 51 11/10/2020 1528    Physical Exam:    VS:  BP (!) 158/90   Pulse (!) 57   Ht 5\' 10"  (1.778 m)   Wt 160 lb (72.6 kg)   SpO2 99%   BMI 22.96 kg/m     Wt Readings from Last 3 Encounters:  01/04/21 160 lb (72.6 kg)  11/21/20 159 lb (72.1 kg)  11/17/20 159 lb 8 oz (72.3 kg)     GEN: Scoliosis.  Slender.. No acute distress HEENT: Normal NECK: No JVD. LYMPHATICS: No lymphadenopathy CARDIAC: No murmur. RRR no gallop, or  edema. VASCULAR:  Normal Pulses. No bruits. RESPIRATORY:  Clear to auscultation without rales, wheezing or rhonchi  ABDOMEN: Soft, non-tender, non-distended, No pulsatile mass, MUSCULOSKELETAL: No deformity  SKIN: Warm and dry NEUROLOGIC:  Alert and oriented x 3 PSYCHIATRIC:  Normal affect   ASSESSMENT:    1. Nonrheumatic aortic valve stenosis   2. Essential hypertension   3. Hyperlipidemia LDL goal <130   4. Coronary artery disease involving native coronary artery of native heart without angina pectoris   5. Age related osteoporosis, unspecified pathological fracture presence    PLAN:    In order of problems listed above:  1. Calcification of the aortic valve by CT.  No murmur on exam.  The calcified aortic valve is a incidental finding.  I discussed this with the patient.  I  do not feel that an echocardiogram or any specific valve evaluation is necessary. 2. Blood pressure needs better control.  Diuretic therapy was discontinued when the diagnosis of osteoporosis was made because of concern about calcium wasting.  He needs to control the blood pressure to 130/80 mmHg as a risk mitigation since he has asymptomatic coronary disease.  Recommend reinstituting diuretic therapy or adding amlodipine 5 mg/day.  Avoid nonsteroidals as much as possible.  Control salt in diet. 3. Lipids are unusually good with a LDL of 51 on no therapy.  Total cholesterol is 135.  HDL is 69.  Triglycerides are normal.  Lipid profile is 9 atherogenic 4. Calcium is identified in the left coronary on a non coronary CT.  Therefore he does have the diagnosis of asymptomatic coronary atherosclerosis.  We discussed primary prevention.  The only issue currently that is not well controlled is his blood pressure.   Overall education and awareness concerning primary risk prevention was discussed in detail: LDL less than 70, hemoglobin A1c less than 7, blood pressure target less than 130/80 mmHg, >150 minutes of moderate  aerobic activity per week, avoidance of smoking, weight control (via diet and exercise), and continued surveillance/management of/for obstructive sleep apnea.  He is prevention minded and motivated.  He does not need a follow-up cardiology visit unless there is difficulty controlling blood pressure or should he develop a murmur or symptoms suggesting CAD angina    Medication Adjustments/Labs and Tests Ordered: Current medicines are reviewed at length with the patient today.  Concerns regarding medicines are outlined above.  No orders of the defined types were placed in this encounter.  No orders of the defined types were placed in this encounter.   Patient Instructions  Medication Instructions:  Your physician recommends that you continue on your current medications as directed. Please refer to the Current Medication list given to you today.  *If you need a refill on your cardiac medications before your next appointment, please call your pharmacy*   Lab Work: None If you have labs (blood work) drawn today and your tests are completely normal, you will receive your results only by: Marland Kitchen MyChart Message (if you have MyChart) OR . A paper copy in the mail If you have any lab test that is abnormal or we need to change your treatment, we will call you to review the results.   Testing/Procedures: None   Follow-Up: At [facility], you and your health needs are our priority.  As part of our continuing mission to provide you with exceptional heart care, we have created designated Provider Care Teams.  These Care Teams include your primary Cardiologist (physician) and Advanced Practice Providers (APPs -  Physician Assistants and Nurse Practitioners) who all work together to provide you with the care you need, when you need it.  We recommend signing up for the patient portal called "MyChart".  Sign up information is provided on this After Visit Summary.  MyChart is used to connect with  patients for Virtual Visits (Telemedicine).  Patients are able to view lab/test results, encounter notes, upcoming appointments, etc.  Non-urgent messages can be sent to your provider as well.   To learn more about what you can do with MyChart, go to [email or web address].    Your next appointment:   As needed  The format for your next appointment:   In Person  Provider:   You may see No primary care provider on file. or one of the following Advanced Practice Providers on  your designated Care Team:    Kathyrn Drown, NP    Other Instructions      Signed, Sinclair Grooms, MD  01/04/2021 9:11 AM    [place]

## 2021-01-04 ENCOUNTER — Encounter: Payer: Self-pay | Admitting: Interventional Cardiology

## 2021-01-04 ENCOUNTER — Other Ambulatory Visit: Payer: Self-pay

## 2021-01-04 ENCOUNTER — Ambulatory Visit (INDEPENDENT_AMBULATORY_CARE_PROVIDER_SITE_OTHER): Payer: Medicare Other | Admitting: Interventional Cardiology

## 2021-01-04 VITALS — BP 158/90 | HR 57 | Ht 70.0 in | Wt 160.0 lb

## 2021-01-04 DIAGNOSIS — E785 Hyperlipidemia, unspecified: Secondary | ICD-10-CM | POA: Diagnosis not present

## 2021-01-04 DIAGNOSIS — I2510 Atherosclerotic heart disease of native coronary artery without angina pectoris: Secondary | ICD-10-CM | POA: Diagnosis not present

## 2021-01-04 DIAGNOSIS — I350 Nonrheumatic aortic (valve) stenosis: Secondary | ICD-10-CM | POA: Diagnosis not present

## 2021-01-04 DIAGNOSIS — M810 Age-related osteoporosis without current pathological fracture: Secondary | ICD-10-CM

## 2021-01-04 DIAGNOSIS — I10 Essential (primary) hypertension: Secondary | ICD-10-CM | POA: Diagnosis not present

## 2021-01-04 NOTE — Patient Instructions (Signed)
Medication Instructions:  Your physician recommends that you continue on your current medications as directed. Please refer to the Current Medication list given to you today.  *If you need a refill on your cardiac medications before your next appointment, please call your pharmacy*   Lab Work: None If you have labs (blood work) drawn today and your tests are completely normal, you will receive your results only by: Marland Kitchen MyChart Message (if you have MyChart) OR . A paper copy in the mail If you have any lab test that is abnormal or we need to change your treatment, we will call you to review the results.   Testing/Procedures: None   Follow-Up: At [facility], you and your health needs are our priority.  As part of our continuing mission to provide you with exceptional heart care, we have created designated Provider Care Teams.  These Care Teams include your primary Cardiologist (physician) and Advanced Practice Providers (APPs -  Physician Assistants and Nurse Practitioners) who all work together to provide you with the care you need, when you need it.  We recommend signing up for the patient portal called "MyChart".  Sign up information is provided on this After Visit Summary.  MyChart is used to connect with patients for Virtual Visits (Telemedicine).  Patients are able to view lab/test results, encounter notes, upcoming appointments, etc.  Non-urgent messages can be sent to your provider as well.   To learn more about what you can do with MyChart, go to [email or web address].    Your next appointment:   As needed  The format for your next appointment:   In Person  Provider:   You may see No primary care provider on file. or one of the following Advanced Practice Providers on your designated Care Team:    Kathyrn Drown, NP    Other Instructions

## 2021-01-04 NOTE — Addendum Note (Signed)
Addended by: Loren Racer on: 01/04/2021 01:05 PM   Modules accepted: Orders

## 2021-01-16 ENCOUNTER — Ambulatory Visit (INDEPENDENT_AMBULATORY_CARE_PROVIDER_SITE_OTHER): Payer: Medicare Other | Admitting: Neurology

## 2021-01-16 ENCOUNTER — Encounter: Payer: Self-pay | Admitting: Neurology

## 2021-01-16 VITALS — BP 174/89 | HR 52 | Ht 70.0 in | Wt 160.5 lb

## 2021-01-16 DIAGNOSIS — H02402 Unspecified ptosis of left eyelid: Secondary | ICD-10-CM | POA: Diagnosis not present

## 2021-01-16 DIAGNOSIS — G7000 Myasthenia gravis without (acute) exacerbation: Secondary | ICD-10-CM

## 2021-01-16 DIAGNOSIS — I2510 Atherosclerotic heart disease of native coronary artery without angina pectoris: Secondary | ICD-10-CM | POA: Diagnosis not present

## 2021-01-16 DIAGNOSIS — H8110 Benign paroxysmal vertigo, unspecified ear: Secondary | ICD-10-CM

## 2021-01-16 MED ORDER — ETODOLAC 400 MG PO TABS
400.0000 mg | ORAL_TABLET | Freq: Two times a day (BID) | ORAL | 11 refills | Status: DC
Start: 2021-01-16 — End: 2022-01-25

## 2021-01-16 NOTE — Progress Notes (Signed)
[facility]  PATIENT: Henry [name] DOB: 08-Sep-1956   REFERRING DOCTOR OR PCP:   Theodosia Quay, PA-C [facility]); Dr. [name] )(PCP) SOURCE: patient, lab reports, notes from PCP  _________________________________   HISTORICAL  CHIEF COMPLAINT:  Chief Complaint  Patient presents with  . Follow-up    RM 12, alone. Last seen 11/17/2020. MG- On Mestinon. No new sx. Saw Cardiologist. No issues. Recommended starting back on diuretic d/t high BP. Sent to PCP to f.u on. He has not see PCP yet to discuss this.    HISTORY OF PRESENT ILLNESS:   Henry [name], at [facility] for neurologic consultation regarding his left ptosis and recent diagnosis of myasthenia gravis.  He feels his MG is better on medication though he still has some ptosis.   He is on Mestinon 60 mg po tid.   He gets some stomach upset.   This is especially bad on days he takes alendronate.     He has left >> right ptosis.    No diplopia.   We discussed medical options.     He has spine issues, currently with back pain,   He takes tramadol 50 mg a day and Ibuprofen with only mild benefit.   He has had fusion in his cervical spine.    Fusion in his back has been delayed due to osteoporosis (on med's).   He tries to stay active.     His vertigo resolved.     Chest CT showed no thymoma.   He had aortic valve calcification and saw cardiology.  No intervention is necessary.  MG History: Henry [name] had the onset of left ptosis in mid February 2022.   While driving that day, he noticed the ptosis, especially when light was brighter.   He saw Dr. Katy Fitch who was concerned about myasthenia gravis.   He returned a few days later and had an ice pack test which improved the ocular symptoms.    He notes eyes are not focusing well.   He has reduced focusing more than ptosis.    Antibodies were present against the acetylcholine receptor, consistent with myasthenia gravis.   Due to the  osteoporosis, steroids have not been started.   He had hep C in the past that was cured with treatment..    He has had cervical fusion C4-C7 04/2019 (Dr. Saintclair Halsted).   He has scoliosis and may need additional surgery.   He has  some arthritic issues in knees and shoulders.  He is active and walks 3 times a week and rides bicycles and goes dancing.    Recent labs 11/10/20 labs showed TSH, CBC, BMP were normal or non-contributary.  05/24/2020 labs Vit D was mildly low (26) 10/25/2020 AChR binding and blocking antibodies are both elevated (binding 5.46 , < 0.4 normal); blocking 40, < 30 normal)  REVIEW OF SYSTEMS: Constitutional: No fevers, chills, sweats, or change in appetite Eyes: No visual changes, double vision, eye pain Ear, nose and throat: No hearing loss, ear pain, nasal congestion, sore throat Cardiovascular: No chest pain, palpitations Respiratory: No shortness of breath at rest or with exertion.   No wheezes GastrointestinaI: No nausea, vomiting, diarrhea, abdominal pain, fecal incontinence Genitourinary: No dysuria, urinary retention or frequency.  No nocturia. Musculoskeletal:Notes arthritis in shoulders and knees.  Has osteoporosis. Integumentary: No rash, pruritus, skin lesions Neurological: as above Psychiatric: No depression at this time.  No anxiety Endocrine: No palpitations, diaphoresis, change in appetite, change in weigh  or increased thirst Hematologic/Lymphatic: No anemia, purpura, petechiae. Allergic/Immunologic: No itchy/runny eyes, nasal congestion, recent allergic reactions, rashes  ALLERGIES: Allergies  Allergen Reactions  . Valacyclovir Rash    HOME MEDICATIONS:  Current Outpatient Medications:  .  alendronate (FOSAMAX) 70 MG tablet, Take 1 tablet (70 mg total) by mouth every 7 (seven) days. Take with a full glass of water on an empty stomach., Disp: 13 tablet, Rfl: 3 .  etodolac (LODINE) 400 MG tablet, Take 1 tablet (400 mg total) by mouth 2 (two) times daily.,  Disp: 60 tablet, Rfl: 11 .  ibuprofen (ADVIL) 200 MG tablet, Take 400 mg by mouth every 6 (six) hours as needed. Take 2 tablets (400mg  total) by mouth every 4 hours as needed for moderate pain., Disp: , Rfl:  .  lisinopril (ZESTRIL) 40 MG tablet, TAKE 1 TABLET BY MOUTH EVERY DAY, Disp: 90 tablet, Rfl: 1 .  pyridostigmine (MESTINON) 60 MG tablet, Take 1 tablet (60 mg total) by mouth 3 (three) times daily., Disp: 90 tablet, Rfl: 5 .  traMADol (ULTRAM) 50 MG tablet, Take 50 mg by mouth every 6 (six) hours as needed., Disp: , Rfl:   PAST MEDICAL HISTORY: Past Medical History:  Diagnosis Date  . Allergy   . Anemia   . Arthritis   . Depression    due to long tern hepatitis C - once the Hep C was cured, had not any issues until last year 2018/01/09) when is wife died  . History of hepatitis C    has been treated in 01-10-2004  . Hypertension   . Pneumonia    as a teenager  . Scoliosis     PAST SURGICAL HISTORY: Past Surgical History:  Procedure Laterality Date  . ANTERIOR CERVICAL DECOMP/DISCECTOMY FUSION N/A 04/13/2019   Procedure: ANTERIOR CERVICAL DECOMPRESSION/DISCECTOMY FUSION CERVICAL FOUR-FIVE, CERVICAL FIVE-SIX, CERVICAL SIX-SEVEN;  Surgeon: Kary Kos, MD;  Location: [place];  Service: Neurosurgery;  Laterality: N/A;  . COLONOSCOPY    . RADIOLOGY WITH ANESTHESIA N/A 03/31/2019   Procedure: MRI WITH ANESTHESIA LUMBAR WITHOUT CONTRAST AND CERVICAL WITHOUT CONTRAST;  Surgeon: Radiologist, Medication, MD;  Location: [place];  Service: Radiology;  Laterality: N/A;  . TONSILLECTOMY AND ADENOIDECTOMY      FAMILY HISTORY: Family History  Problem Relation Age of Onset  . Hypertension Mother   . Hypertension Father   . Alcoholism Brother     SOCIAL HISTORY:  Social History   Socioeconomic History  . Marital status: Married    Spouse name: Not on file  . Number of children: Not on file  . Years of education: Not on file  . Highest education level: Not on file  Occupational History  . Not on  file  Tobacco Use  . Smoking status: Former Smoker    Years: 24.00  . Smokeless tobacco: Never Used  . Tobacco comment: light smoker when I smoked  Vaping Use  . Vaping Use: Never used  Substance and Sexual Activity  . Alcohol use: Yes    Alcohol/week: 35.0 standard drinks    Types: 21 Cans of beer, 14 Standard drinks or equivalent per week    Comment: 2-3 drinks per day  . Drug use: Yes    Types: Marijuana    Comment: every once in a while per pt  . Sexual activity: Not Currently  Other Topics Concern  . Not on file  Social History Narrative   Right handed   1 cup coffee every morning, ice tea w/ lunch, no  soda   Social Determinants of Health   Financial Resource Strain: Not on file  Food Insecurity: Not on file  Transportation Needs: Not on file  Physical Activity: Not on file  Stress: Not on file  Social Connections: Not on file  Intimate Partner Violence: Not on file     PHYSICAL EXAM  Vitals:   01/16/21 0918  BP: (!) 174/89  Pulse: (!) 52  Weight: 160 lb 8 oz (72.8 kg)  Height: 5\' 10"  (1.778 m)    Body mass index is 23.03 kg/m.   General: The patient is well-developed and well-nourished and in no acute distress  HEENT:  Head is [place]/AT.  Sclera are anicteric.  Skin: Extremities are without rash or  edema.  Neurologic Exam  Mental status: The patient is alert and oriented x 3 at the time of the examination. The patient has apparent normal recent and remote memory, with an apparently normal attention span and concentration ability.   Speech is normal.  Cranial nerves: He has ptosis on the left that worsens with sustained upgaze.  Extraocular muscles were intact..  Facial strength and sensation was normal.  No dysarthria.  No obvious hearing deficits are noted.  Motor:  Muscle bulk is normal.   Tone is normal. Strength is  5 / 5 in all 4 extremities.  He can easily stand up from a chair without using his arms.  Sensory: Sensory testing is intact to soft  touch and vibration sensation in all 4 extremities.  Coordination: Cerebellar testing reveals good finger-nose-finger and heel-to-shin bilaterally.  Gait and station: Station is normal.   Gait is normal. Tandem gait is normal. Romberg is negative.   Reflexes: Deep tendon reflexes are symmetric and normal bilaterally.       DIAGNOSTIC DATA (LABS, IMAGING, TESTING) - I reviewed patient records, labs, notes, testing and imaging myself where available.  Lab Results  Component Value Date   WBC 7.6 11/10/2020   HGB 13.9 11/10/2020   HCT 40.8 11/10/2020   MCV 87.0 11/10/2020   PLT 270.0 11/10/2020      Component Value Date/Time   NA 137 11/10/2020 1528   K 4.0 11/10/2020 1528   CL 105 11/10/2020 1528   CO2 28 11/10/2020 1528   GLUCOSE 119 (H) 11/10/2020 1528   BUN 18 11/10/2020 1528   CREATININE 0.82 11/10/2020 1528   CREATININE 0.85 05/24/2020 1059   CALCIUM 9.4 11/10/2020 1528   PROT 6.6 11/10/2020 1528   ALBUMIN 4.1 11/10/2020 1528   AST 17 11/10/2020 1528   ALT 12 11/10/2020 1528   ALKPHOS 60 11/10/2020 1528   BILITOT 0.7 11/10/2020 1528   GFRNONAA >60 03/31/2019 0714   GFRAA >60 03/31/2019 0714   Lab Results  Component Value Date   CHOL 135 11/10/2020   HDL 68.90 11/10/2020   LDLCALC 51 11/10/2020   TRIG 73.0 11/10/2020   CHOLHDL 2 11/10/2020   Lab Results  Component Value Date   HGBA1C 5.0 04/13/2019   Lab Results  Component Value Date   [id number] (H) 11/17/2018   Lab Results  Component Value Date   TSH 0.93 11/10/2020       ASSESSMENT AND PLAN  Myasthenia gravis (HCC)  Ptosis of eyelid, left  Benign paroxysmal positional vertigo, unspecified laterality  1.   Continue mestinon and can increase to 4 times a day    If worsens, we will add an immunotherapy.  He has osteoporosis so I would avoid to add steroids.  We  could initiate either Imuran or CellCept if needed.  Additionally we could consider IVIG to give some benefit until the  immunosuppressive therapy becomes effective if he has symptoms outside of the ptosis.  2.   For his back, we will change from ibuprofen to etodolac.    3.  F/u in 6 months, sooner if new or worsenig symptoms.       Seger Jani A. Felecia Shelling, MD, [facility] 09/16/3843, 3:64 PM Certified in Neurology, Clinical Neurophysiology, Sleep Medicine and Neuroimaging  [facility] [address] [place], [place] [address] [phone]

## 2021-02-22 ENCOUNTER — Other Ambulatory Visit: Payer: Self-pay

## 2021-02-22 ENCOUNTER — Ambulatory Visit (INDEPENDENT_AMBULATORY_CARE_PROVIDER_SITE_OTHER): Payer: Medicare Other | Admitting: Dermatology

## 2021-02-22 ENCOUNTER — Encounter: Payer: Self-pay | Admitting: Dermatology

## 2021-02-22 DIAGNOSIS — Z1283 Encounter for screening for malignant neoplasm of skin: Secondary | ICD-10-CM | POA: Diagnosis not present

## 2021-02-22 DIAGNOSIS — L570 Actinic keratosis: Secondary | ICD-10-CM | POA: Diagnosis not present

## 2021-02-22 DIAGNOSIS — I2510 Atherosclerotic heart disease of native coronary artery without angina pectoris: Secondary | ICD-10-CM

## 2021-02-22 DIAGNOSIS — Z808 Family history of malignant neoplasm of other organs or systems: Secondary | ICD-10-CM

## 2021-02-22 DIAGNOSIS — D485 Neoplasm of uncertain behavior of skin: Secondary | ICD-10-CM

## 2021-02-22 DIAGNOSIS — C44319 Basal cell carcinoma of skin of other parts of face: Secondary | ICD-10-CM | POA: Diagnosis not present

## 2021-02-22 DIAGNOSIS — C4491 Basal cell carcinoma of skin, unspecified: Secondary | ICD-10-CM

## 2021-02-22 HISTORY — DX: Basal cell carcinoma of skin, unspecified: C44.91

## 2021-02-22 NOTE — Patient Instructions (Signed)

## 2021-02-27 ENCOUNTER — Encounter: Payer: Self-pay | Admitting: Dermatology

## 2021-02-27 NOTE — Progress Notes (Signed)
   New Patient   Subjective  Henry [name] is a 65 y.o. male who presents for the following: New Patient (Initial Visit) (Patient here today for lesion on left side face x months per patient when the scab comes off it does bleed. No personal history of atypical moles, melanoma or non mole skin cancer. Per patient his father did have a history of non mole skin cancer. No family history of atypical moles or melanoma. ).  General skin examination, growth on left jawline Location:  Duration:  Quality:  Associated Signs/Symptoms: Modifying Factors:  Severity:  Timing: Context:    The following portions of the chart were reviewed this encounter and updated as appropriate:  Tobacco  Allergies  Meds  Problems  Med Hx  Surg Hx  Fam Hx       Objective  Well appearing patient in no apparent distress; mood and affect are within normal limits. Left Anterior Mandible Waxy verrucous 5 mm pink crust       Right Superior Helix Pink 4 mm hornlike crust  Torso - Posterior (Back) Waist up skin examination, no atypical pigmented lesions.    All skin waist up examined.   Assessment & Plan  Neoplasm of uncertain behavior of skin Left Anterior Mandible  Skin / nail biopsy Type of biopsy: tangential   Informed consent: discussed and consent obtained   Timeout: patient name, date of birth, surgical site, and procedure verified   Procedure prep:  Patient was prepped and draped in usual sterile fashion (Non sterile) Prep type:  Chlorhexidine Anesthesia: the lesion was anesthetized in a standard fashion   Anesthetic:  1% lidocaine w/ epinephrine 1-100,000 local infiltration Instrument used: flexible razor blade   Hemostasis achieved with: ferric subsulfate   Outcome: patient tolerated procedure well   Post-procedure details: sterile dressing applied and wound care instructions given   Dressing type: bandage and petrolatum    Specimen 1 - Surgical pathology Differential  Diagnosis: R/O BCC vs SCC  Check Margins: No  AK (actinic keratosis) Right Superior Helix  Destruction of lesion - Right Superior Helix Complexity: simple   Destruction method: cryotherapy   Informed consent: discussed and consent obtained   Timeout:  patient name, date of birth, surgical site, and procedure verified Lesion destroyed using liquid nitrogen: Yes   Cryotherapy cycles:  3 Outcome: patient tolerated procedure well with no complications   Post-procedure details: wound care instructions given    Encounter for screening for malignant neoplasm of skin Torso - Posterior (Back)  Annual skin examination.

## 2021-03-09 ENCOUNTER — Telehealth: Payer: Self-pay

## 2021-03-09 NOTE — Telephone Encounter (Signed)
patient aware results 8/25 visit

## 2021-03-09 NOTE — Telephone Encounter (Signed)
-----   Message from Lavonna Monarch, MD sent at 03/03/2021  8:20 PM EDT ----- Schedule surgery with Dr. Darene Lamer

## 2021-04-26 IMAGING — CT CT CHEST W/ CM
1 series · 15 of 33 positions shown, 19 images · IV contrast (APPLIED)
Comparison: None.

CLINICAL DATA: Myasthenia gravis.

EXAM:
CT CHEST WITH CONTRAST
TECHNIQUE: Multidetector CT imaging of the chest was performed during
intravenous contrast administration.
CONTRAST:  75mL M1BQXL-QJJ IOPAMIDOL (M1BQXL-QJJ) INJECTION 61%

[Series 2: chest w/cm · axial · 0.74mm/px · z∈[-261,+17]mm · 15 of 165 slices shown, 19 images]
[im 13/165  mediastinal]
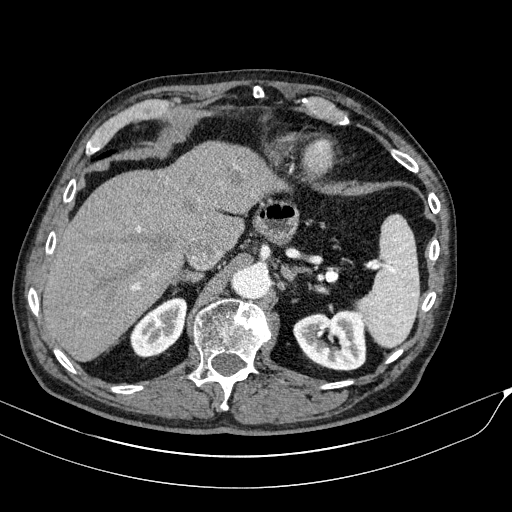
[im 13/165  lung]
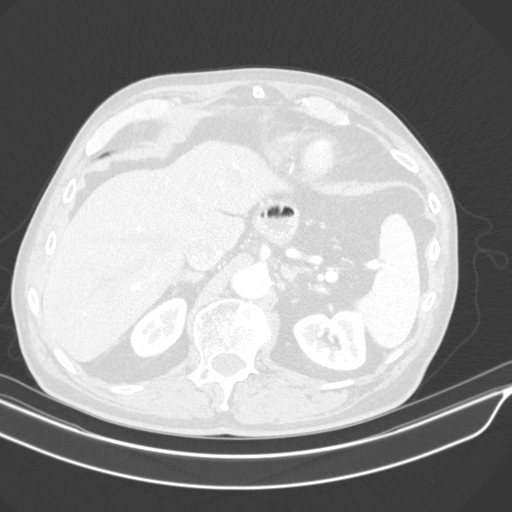
[im 25/165  lung]
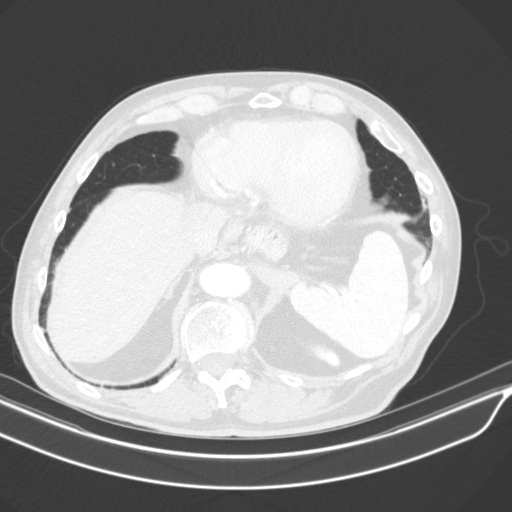
[im 33/165  lung]
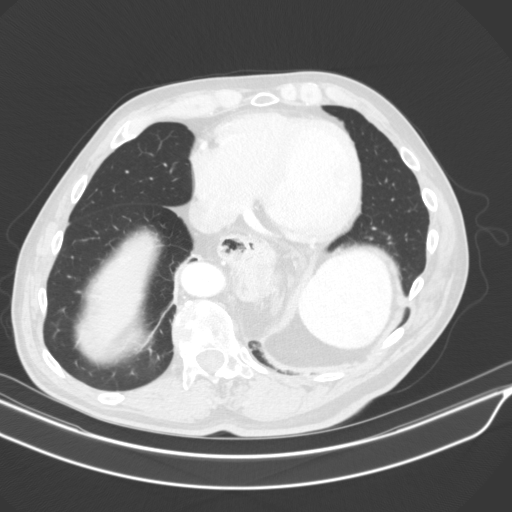
[im 43/165  lung]
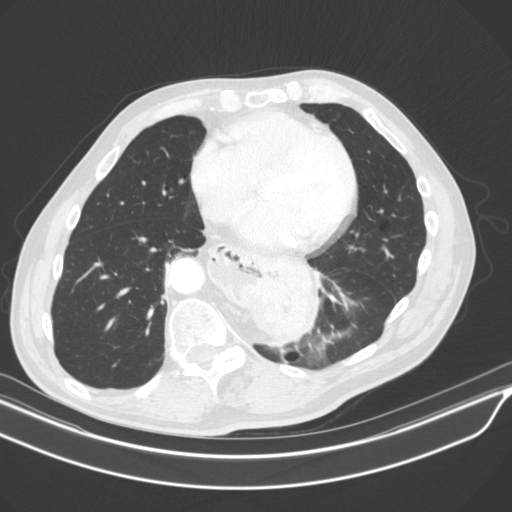
[im 55/165  mediastinal]
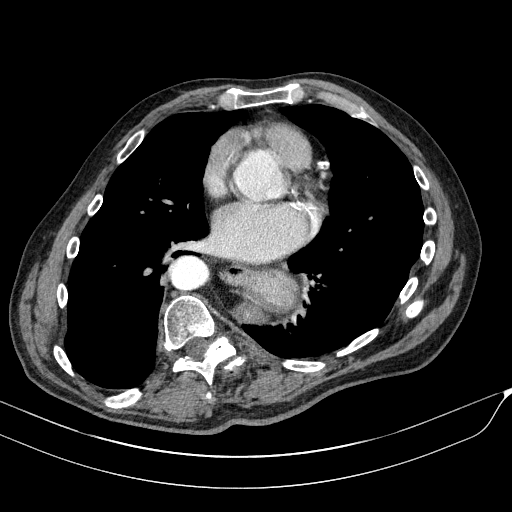
[im 55/165  lung]
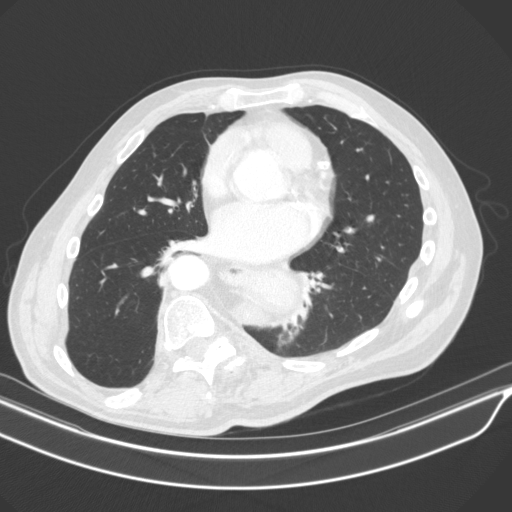
[im 66/165  lung]
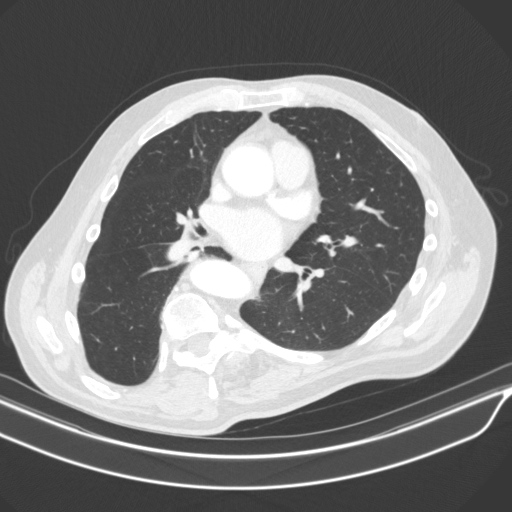
[im 73/165  lung]
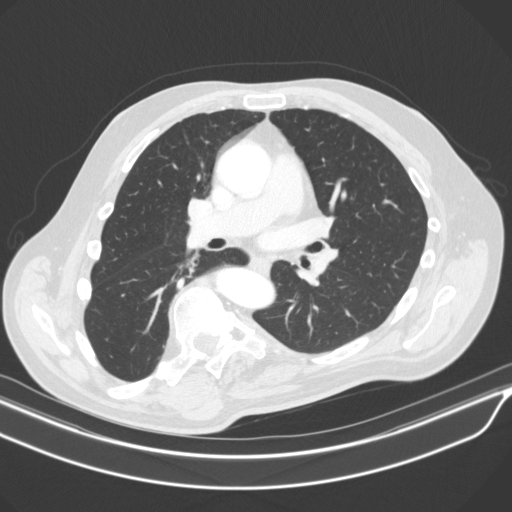
[im 86/165  lung]
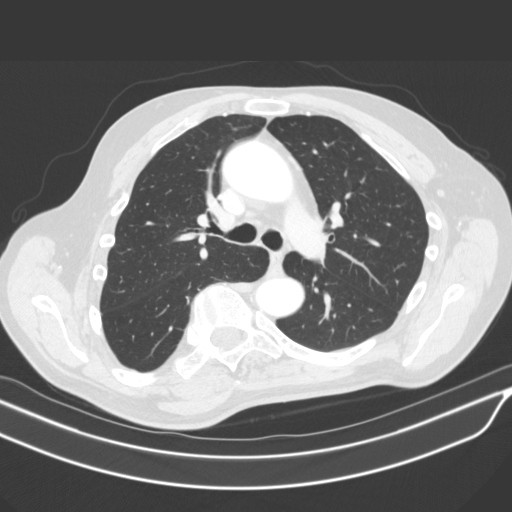
[im 92/165  mediastinal]
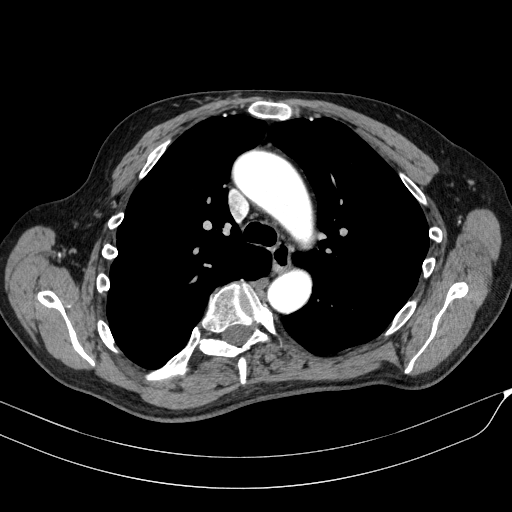
[im 92/165  lung]
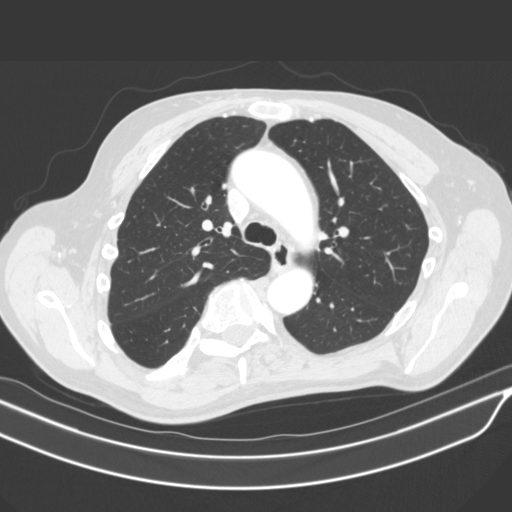
[im 99/165  lung]
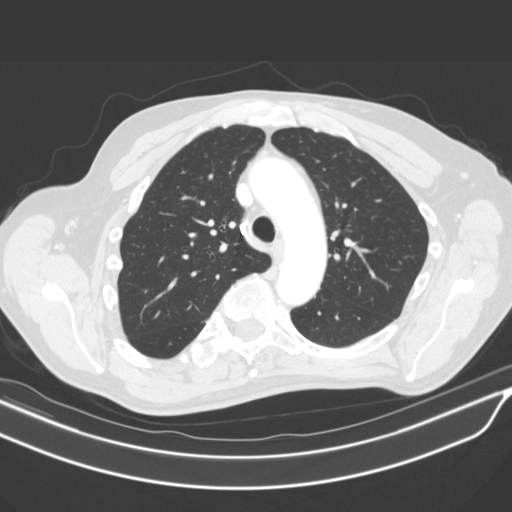
[im 110/165  lung]
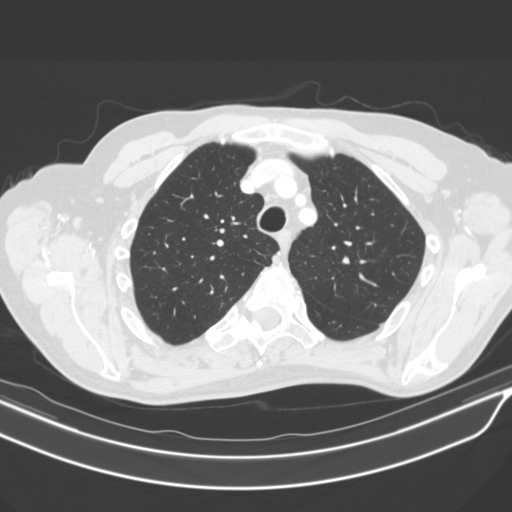
[im 122/165  lung]
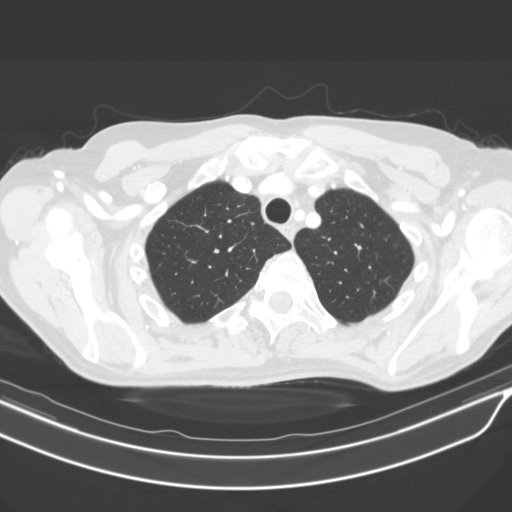
[im 132/165  mediastinal]
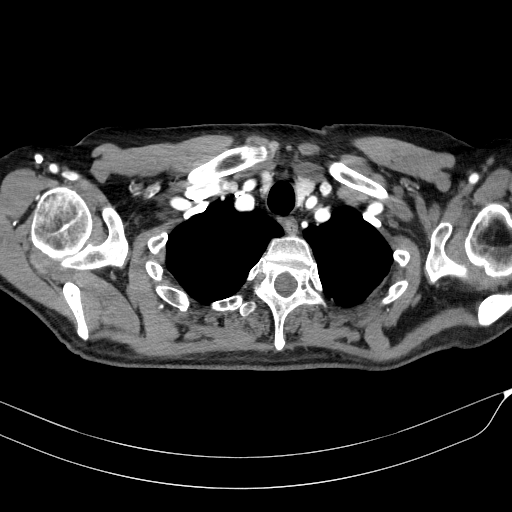
[im 132/165  lung]
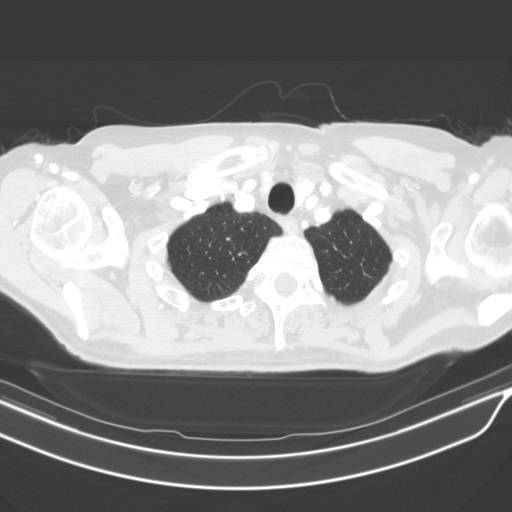
[im 140/165  lung]
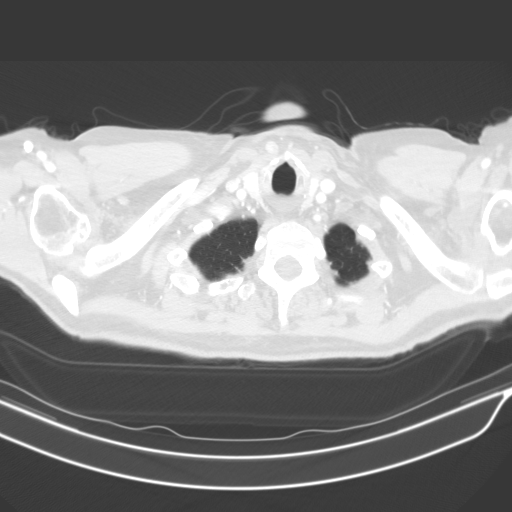
[im 152/165  lung]
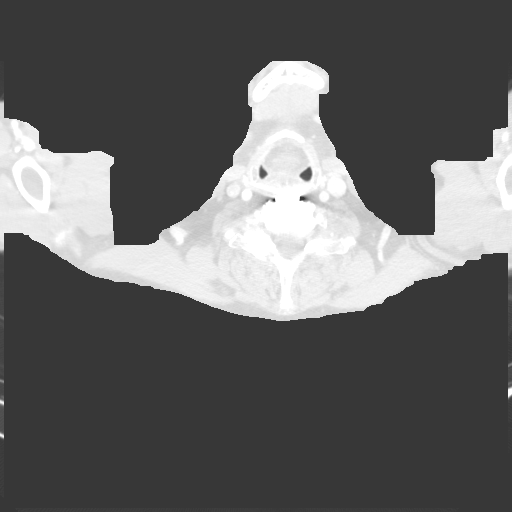

[15 of 33 positions shown; findings below may reference images not displayed]

FINDINGS: Cardiovascular: Atherosclerotic calcification of the aortic valve
and coronary arteries. Heart size normal. No pericardial effusion.

Mediastinum/Nodes: No pathologically enlarged mediastinal, hilar or
axillary lymph nodes. No prevascular mass. Esophagus is grossly
unremarkable. Large hiatal hernia.

Lungs/Pleura: Scarring in the lingula and left lower lobe. Lungs are
otherwise clear. No pleural fluid. Airway is unremarkable.

Upper Abdomen: Visualized portions of the liver, adrenal glands,
kidneys, spleen, pancreas, stomach and bowel are unremarkable with
the exception of a large hiatal hernia. No upper abdominal
adenopathy.

Musculoskeletal: Degenerative changes and scoliosis in the spine.
Old right rib fractures.
IMPRESSION: 1. No evidence of a thymoma.
2. Large hiatal hernia.
3. Coronary artery calcification.

## 2021-05-04 ENCOUNTER — Encounter: Payer: Medicare Other | Admitting: Dermatology

## 2021-05-09 ENCOUNTER — Other Ambulatory Visit: Payer: Self-pay

## 2021-05-09 ENCOUNTER — Encounter: Payer: Self-pay | Admitting: Emergency Medicine

## 2021-05-09 ENCOUNTER — Ambulatory Visit (INDEPENDENT_AMBULATORY_CARE_PROVIDER_SITE_OTHER): Payer: Medicare Other | Admitting: Emergency Medicine

## 2021-05-09 VITALS — BP 162/80 | HR 58 | Temp 98.3°F | Ht 70.0 in | Wt 154.0 lb

## 2021-05-09 DIAGNOSIS — I2510 Atherosclerotic heart disease of native coronary artery without angina pectoris: Secondary | ICD-10-CM

## 2021-05-09 DIAGNOSIS — Z23 Encounter for immunization: Secondary | ICD-10-CM

## 2021-05-09 DIAGNOSIS — M109 Gout, unspecified: Secondary | ICD-10-CM

## 2021-05-09 LAB — COMPREHENSIVE METABOLIC PANEL
ALT: 11 U/L (ref 0–53)
AST: 18 U/L (ref 0–37)
Albumin: 3.9 g/dL (ref 3.5–5.2)
Alkaline Phosphatase: 55 U/L (ref 39–117)
BUN: 16 mg/dL (ref 6–23)
CO2: 27 mEq/L (ref 19–32)
Calcium: 9.3 mg/dL (ref 8.4–10.5)
Chloride: 105 mEq/L (ref 96–112)
Creatinine, Ser: 0.77 mg/dL (ref 0.40–1.50)
GFR: 92.66 mL/min (ref >60.00)
Glucose, Bld: 86 mg/dL (ref 70–99)
Potassium: 3.8 mEq/L (ref 3.5–5.1)
Sodium: 140 mEq/L (ref 135–145)
Total Bilirubin: 0.9 mg/dL (ref 0.2–1.2)
Total Protein: 6.9 g/dL (ref 6.0–8.3)

## 2021-05-09 LAB — URIC ACID: Uric Acid, Serum: 3.1 mg/dL — ABNORMAL LOW (ref 4.0–7.8)

## 2021-05-09 MED ORDER — COLCHICINE 0.6 MG PO TABS: ORAL_TABLET | ORAL | 1 refills | Status: DC

## 2021-05-09 MED ORDER — ALLOPURINOL 100 MG PO TABS: 100.0000 mg | ORAL_TABLET | Freq: Every day | ORAL | 6 refills | Status: DC

## 2021-05-09 NOTE — Progress Notes (Signed)
Henry [name] 65 y.o.   Chief Complaint  Patient presents with   Foot Swelling    Pt states that there is some pain and swelling in right foot. X 1-2 weeks.    HISTORY OF PRESENT ILLNESS: This is a 65 y.o. male complaining of pain and swelling to right foot that started several days ago. Denies injury. No other complaints or medical concerns today.  HPI   Prior to Admission medications   Medication Sig Start Date End Date Taking? Authorizing Provider  alendronate (FOSAMAX) 70 MG tablet Take 1 tablet (70 mg total) by mouth every 7 (seven) days. Take with a full glass of water on an empty stomach. 11/21/20  Yes Henry [name], Henry Crazier, MD  etodolac (LODINE) 400 MG tablet Take 1 tablet (400 mg total) by mouth 2 (two) times daily. 01/16/21  Yes Henry [name], Henry Means, MD  ibuprofen (ADVIL) 200 MG tablet Take 400 mg by mouth every 6 (six) hours as needed. Take 2 tablets ('400mg'$  total) by mouth every 4 hours as needed for moderate pain.   Yes [provider]  lisinopril (ZESTRIL) 40 MG tablet TAKE 1 TABLET BY MOUTH EVERY DAY 12/09/20  Yes Henry Lima, MD  pyridostigmine (MESTINON) 60 MG tablet Take 1 tablet (60 mg total) by mouth 3 (three) times daily. 11/17/20  Yes Henry [name], Henry Means, MD  traMADol (ULTRAM) 50 MG tablet Take 1 tablet by mouth every 6 (six) hours as needed. 01/06/21  Yes [provider]  traMADol (ULTRAM) 50 MG tablet Take 50 mg by mouth every 6 (six) hours as needed. Patient not taking: Reported on 05/09/2021 11/08/20   [provider]    Allergies  Allergen Reactions   Valacyclovir Rash    Patient Active Problem List   Diagnosis Date Noted   Myasthenia gravis ([place]) 01/16/2021   Ptosis of eyelid, left 01/16/2021   Vitamin D insufficiency 05/25/2020   Osteoporosis without current pathological fracture 05/25/2020   Hyperlipidemia LDL goal <130 06/08/2019   Benign paroxysmal positional vertigo 06/08/2019   Spinal stenosis in cervical region  04/13/2019   Herpes simplex infection of penis 11/25/2018   Left lumbar radiculitis 11/17/2018   Benign prostatic hyperplasia without lower urinary tract symptoms 11/17/2018   Current moderate episode of major depressive disorder without prior episode ([place]) 11/17/2018   Routine general medical examination at a health care facility 11/17/2018   Essential hypertension 05/02/2018    Past Medical History:  Diagnosis Date   Allergy    Anemia    Arthritis    Depression    due to long tern hepatitis C - once the Hep C was cured, had not any issues until last year 2017/12/26) when is wife died   History of hepatitis C    has been treated in December 27, 2003   Hypertension    Pneumonia    as a teenager   Scoliosis     Past Surgical History:  Procedure Laterality Date   ANTERIOR CERVICAL DECOMP/DISCECTOMY FUSION N/A 04/13/2019   Procedure: ANTERIOR CERVICAL DECOMPRESSION/DISCECTOMY FUSION CERVICAL FOUR-FIVE, CERVICAL FIVE-SIX, CERVICAL SIX-SEVEN;  Surgeon: Henry Kos, MD;  Location: [facility];  Service: Neurosurgery;  Laterality: N/A;   COLONOSCOPY     RADIOLOGY WITH ANESTHESIA N/A 03/31/2019   Procedure: MRI WITH ANESTHESIA LUMBAR WITHOUT CONTRAST AND CERVICAL WITHOUT CONTRAST;  Surgeon: Radiologist, Medication, MD;  Location: [facility];  Service: Radiology;  Laterality: N/A;   TONSILLECTOMY AND ADENOIDECTOMY      Social History   Socioeconomic History  Marital status: Widowed    Spouse name: Not on file   Number of children: Not on file   Years of education: Not on file   Highest education level: Not on file  Occupational History   Not on file  Tobacco Use   Smoking status: Former    Years: 24.00    Types: Cigarettes   Smokeless tobacco: Never   Tobacco comments:    light smoker when I smoked  Vaping Use   Vaping Use: Never used  Substance and Sexual Activity   Alcohol use: Yes    Alcohol/week: 35.0 standard drinks    Types: 21 Cans of beer, 14 Standard drinks or equivalent per week    Comment:  2-3 drinks per day   Drug use: Yes    Types: Marijuana    Comment: every once in a while per pt   Sexual activity: Not Currently  Other Topics Concern   Not on file  Social History Narrative   Right handed   1 cup coffee every morning, ice tea w/ lunch, no soda   Social Determinants of Health   Financial Resource Strain: Not on file  Food Insecurity: Not on file  Transportation Needs: Not on file  Physical Activity: Not on file  Stress: Not on file  Social Connections: Not on file  Intimate Partner Violence: Not on file    Family History  Problem Relation Age of Onset   Hypertension Mother    Hypertension Father    Alcoholism Brother      Review of Systems  Constitutional: Negative.  Negative for chills and fever.  HENT: Negative.  Negative for congestion and sore throat.   Respiratory: Negative.  Negative for cough and shortness of breath.   Cardiovascular:  Negative for chest pain and palpitations.  Gastrointestinal:  Negative for abdominal pain, diarrhea, nausea and vomiting.  Genitourinary: Negative.   Musculoskeletal:  Positive for joint pain.  Skin: Negative.   Neurological:  Negative for dizziness and headaches.  All other systems reviewed and are negative.   Physical Exam Vitals reviewed.  Constitutional:      Appearance: Normal appearance.  HENT:     Head: Normocephalic.  Eyes:     Extraocular Movements: Extraocular movements intact.     Pupils: Pupils are equal, round, and reactive to light.  Cardiovascular:     Rate and Rhythm: Normal rate.  Pulmonary:     Effort: Pulmonary effort is normal.  Musculoskeletal:     Cervical back: Normal range of motion.     Comments: Right foot: Positive erythema and tenderness to first MTP joint compatible with gout  Skin:    General: Skin is warm and dry.     Capillary Refill: Capillary refill takes less than 2 seconds.  Neurological:     General: No focal deficit present.     Mental Status: He is alert and  oriented to person, place, and time.      ASSESSMENT & PLAN: Henry [name] was seen today for foot swelling.  Diagnoses and all orders for this visit:  Acute gouty arthritis -     Comprehensive metabolic panel -     Uric acid -     colchicine 0.6 MG tablet; Take 1.2 mg now and 0.6 mg one hour later. Take 0.6 mg daily after that x 5 days. -     allopurinol (ZYLOPRIM) 100 MG tablet; Take 1 tablet (100 mg total) by mouth daily.  Need for influenza vaccination -  Flu Vaccine QUAD High Dose(Fluad)  Patient Instructions  Gout  Gout is painful swelling of your joints. Gout is a type of arthritis. It is caused by having too much uric acid in your body. Uric acid is a chemical that is made when your body breaks down substances called purines. If your body has too much uric acid, sharp crystals can form and build up in your joints. Thiscauses pain and swelling. Gout attacks can happen quickly and be very painful (acute gout). Over time, the attacks can affect more joints and happen more often (chronic gout). What are the causes? Too much uric acid in your blood. This can happen because: Your kidneys do not remove enough uric acid from your blood. Your body makes too much uric acid. You eat too many foods that are high in purines. These foods include organ meats, some seafood, and beer. Trauma or stress. What increases the risk? Having a family history of gout. Being male and middle-aged. Being male and having gone through menopause. Being very overweight (obese). Drinking alcohol, especially beer. Not having enough water in the body (being dehydrated). Losing weight too quickly. Having an organ transplant. Having lead poisoning. Taking certain medicines. Having kidney disease. Having a skin condition called psoriasis. What are the signs or symptoms? An attack of acute gout usually happens in just one joint. The most common place is the big toe. Attacks often start at night. Other  joints that may be affected include joints of the feet, ankle, knee, fingers, wrist, or elbow. Symptoms of an attack may include: Very bad pain. Warmth. Swelling. Stiffness. Shiny, red, or purple skin. Tenderness. The affected joint may be very painful to touch. Chills and fever. Chronic gout may cause symptoms more often. More joints may be involved. You may also have white or yellow lumps (tophi) on your hands or feet or in other areas near your joints. How is this treated? Treatment for this condition has two phases: treating an acute attack and preventing future attacks. Acute gout treatment may include: NSAIDs. Steroids. These are taken by mouth or injected into a joint. Colchicine. This medicine relieves pain and swelling. It can be given by mouth or through an IV tube. Preventive treatment may include: Taking small doses of NSAIDs or colchicine daily. Using a medicine that reduces uric acid levels in your blood. Making changes to your diet. You may need to see a food expert (dietitian) about what to eat and drink to prevent gout. Follow these instructions at home: During a gout attack  If told, put ice on the painful area: Put ice in a plastic bag. Place a towel between your skin and the bag. Leave the ice on for 20 minutes, 2-3 times a day. Raise (elevate) the painful joint above the level of your heart as often as you can. Rest the joint as much as possible. If the joint is in your leg, you may be given crutches. Follow instructions from your doctor about what you cannot eat or drink.  Avoiding future gout attacks Eat a low-purine diet. Avoid foods and drinks such as: Liver. Kidney. Anchovies. Asparagus. Herring. Mushrooms. Mussels. Beer. Stay at a healthy weight. If you want to lose weight, talk with your doctor. Do not lose weight too fast. Start or continue an exercise plan as told by your doctor. Eating and drinking Drink enough fluids to keep your pee (urine)  pale yellow. If you drink alcohol: Limit how much you use to: 0-1 drink a day  for women. 0-2 drinks a day for men. Be aware of how much alcohol is in your drink. In the [place] one drink equals one 12 oz bottle of beer (355 mL), one 5 oz glass of wine (148 mL), or one 1 oz glass of hard liquor (44 mL). General instructions Take over-the-counter and prescription medicines only as told by your doctor. Do not drive or use heavy machinery while taking prescription pain medicine. Return to your normal activities as told by your doctor. Ask your doctor what activities are safe for you. Keep all follow-up visits as told by your doctor. This is important. Contact a doctor if: You have another gout attack. You still have symptoms of a gout attack after 10 days of treatment. You have problems (side effects) because of your medicines. You have chills or a fever. You have burning pain when you pee (urinate). You have pain in your lower back or belly. Get help right away if: You have very bad pain. Your pain cannot be controlled. You cannot pee. Summary Gout is painful swelling of the joints. The most common site of pain is the big toe, but it can affect other joints. Medicines and avoiding some foods can help to prevent and treat gout attacks. This information is not intended to replace advice given to you by your health care provider. Make sure you discuss any questions you have with your healthcare provider. Document Revised: 03/19/2018 Document Reviewed: 03/19/2018 Elsevier Patient Education  2022 [place], MD [facility]

## 2021-05-09 NOTE — Patient Instructions (Signed)
Gout Gout is painful swelling of your joints. Gout is a type of arthritis. It is caused by having too much uric acid in your body. Uric acid is a chemical that is made when your body breaks down substances called purines. If your body has too much uric acid, sharp crystals can form and build up in your joints. This causes pain and swelling. Gout attacks can happen quickly and be very painful (acute gout). Over time, the attacks can affect more joints and happen more often (chronic gout). What are the causes? Too much uric acid in your blood. This can happen because: Your kidneys do not remove enough uric acid from your blood. Your body makes too much uric acid. You eat too many foods that are high in purines. These foods include organ meats, some seafood, and beer. Trauma or stress. What increases the risk? Having a family history of gout. Being male and middle-aged. Being male and having gone through menopause. Being very overweight (obese). Drinking alcohol, especially beer. Not having enough water in the body (being dehydrated). Losing weight too quickly. Having an organ transplant. Having lead poisoning. Taking certain medicines. Having kidney disease. Having a skin condition called psoriasis. What are the signs or symptoms? An attack of acute gout usually happens in just one joint. The most common place is the big toe. Attacks often start at night. Other joints that may be affected include joints of the feet, ankle, knee, fingers, wrist, or elbow. Symptoms of an attack may include: Very bad pain. Warmth. Swelling. Stiffness. Shiny, red, or purple skin. Tenderness. The affected joint may be very painful to touch. Chills and fever. Chronic gout may cause symptoms more often. More joints may be involved. You may also have white or yellow lumps (tophi) on your hands or feet or in other areas near your joints. How is this treated? Treatment for this condition has two phases:  treating an acute attack and preventing future attacks. Acute gout treatment may include: NSAIDs. Steroids. These are taken by mouth or injected into a joint. Colchicine. This medicine relieves pain and swelling. It can be given by mouth or through an IV tube. Preventive treatment may include: Taking small doses of NSAIDs or colchicine daily. Using a medicine that reduces uric acid levels in your blood. Making changes to your diet. You may need to see a food expert (dietitian) about what to eat and drink to prevent gout. Follow these instructions at home: During a gout attack  If told, put ice on the painful area: Put ice in a plastic bag. Place a towel between your skin and the bag. Leave the ice on for 20 minutes, 2-3 times a day. Raise (elevate) the painful joint above the level of your heart as often as you can. Rest the joint as much as possible. If the joint is in your leg, you may be given crutches. Follow instructions from your doctor about what you cannot eat or drink. Avoiding future gout attacks Eat a low-purine diet. Avoid foods and drinks such as: Liver. Kidney. Anchovies. Asparagus. Herring. Mushrooms. Mussels. Beer. Stay at a healthy weight. If you want to lose weight, talk with your doctor. Do not lose weight too fast. Start or continue an exercise plan as told by your doctor. Eating and drinking Drink enough fluids to keep your pee (urine) pale yellow. If you drink alcohol: Limit how much you use to: 0-1 drink a day for women. 0-2 drinks a day for men. Be aware of   how much alcohol is in your drink. In the [place] one drink equals one 12 oz bottle of beer (355 mL), one 5 oz glass of wine (148 mL), or one 1 oz glass of hard liquor (44 mL). General instructions Take over-the-counter and prescription medicines only as told by your doctor. Do not drive or use heavy machinery while taking prescription pain medicine. Return to your normal activities as told by your  doctor. Ask your doctor what activities are safe for you. Keep all follow-up visits as told by your doctor. This is important. Contact a doctor if: You have another gout attack. You still have symptoms of a gout attack after 10 days of treatment. You have problems (side effects) because of your medicines. You have chills or a fever. You have burning pain when you pee (urinate). You have pain in your lower back or belly. Get help right away if: You have very bad pain. Your pain cannot be controlled. You cannot pee. Summary Gout is painful swelling of the joints. The most common site of pain is the big toe, but it can affect other joints. Medicines and avoiding some foods can help to prevent and treat gout attacks. This information is not intended to replace advice given to you by your health care provider. Make sure you discuss any questions you have with your health care provider. Document Revised: 03/19/2018 Document Reviewed: 03/19/2018 Elsevier Patient Education  2022 Elsevier Inc.  

## 2021-05-11 ENCOUNTER — Other Ambulatory Visit: Payer: Self-pay | Admitting: Neurology

## 2021-05-18 ENCOUNTER — Encounter: Payer: Self-pay | Admitting: Dermatology

## 2021-05-18 ENCOUNTER — Other Ambulatory Visit: Payer: Self-pay

## 2021-05-18 ENCOUNTER — Ambulatory Visit (INDEPENDENT_AMBULATORY_CARE_PROVIDER_SITE_OTHER): Payer: Medicare Other | Admitting: Dermatology

## 2021-05-18 DIAGNOSIS — C44319 Basal cell carcinoma of skin of other parts of face: Secondary | ICD-10-CM

## 2021-05-18 DIAGNOSIS — C44310 Basal cell carcinoma of skin of unspecified parts of face: Secondary | ICD-10-CM

## 2021-05-18 NOTE — Patient Instructions (Signed)

## 2021-05-25 ENCOUNTER — Ambulatory Visit (INDEPENDENT_AMBULATORY_CARE_PROVIDER_SITE_OTHER): Payer: Medicare Other | Admitting: *Deleted

## 2021-05-25 ENCOUNTER — Other Ambulatory Visit: Payer: Self-pay

## 2021-05-25 DIAGNOSIS — Z4802 Encounter for removal of sutures: Secondary | ICD-10-CM

## 2021-05-25 NOTE — Progress Notes (Signed)
Here for suture removal x 3 - no sign or symptoms of infection- pathology to patient per Dr Denna Haggard

## 2021-05-25 NOTE — Progress Notes (Signed)
Per Dr. Denna Haggard refer patient to [facility] for [facility] treatment. Referral information sent.

## 2021-05-26 ENCOUNTER — Encounter: Payer: Self-pay | Admitting: Dermatology

## 2021-05-26 NOTE — Progress Notes (Signed)
   Follow-Up Visit   Subjective  Henry [name] is a 65 y.o. male who presents for the following: Procedure (Here to treat BCC x 1 ).  basal cell carcinoma left cheek Location:  Duration:  Quality:  Associated Signs/Symptoms: Modifying Factors:  Severity:  Timing: Context:   Objective  Well appearing patient in no apparent distress; mood and affect are within normal limits. Left Anterior Mandible Biopsy site identified by nurse, patient, and me    A focused examination was performed including head and neck. Relevant physical exam findings are noted in the Assessment and Plan.   Assessment & Plan    BCC (basal cell carcinoma), face Left Anterior Mandible  Destruction of lesion Complexity: simple   Destruction method: electrodesiccation and curettage   Informed consent: discussed and consent obtained   Timeout:  patient name, date of birth, surgical site, and procedure verified Anesthesia: the lesion was anesthetized in a standard fashion   Anesthetic:  1% lidocaine w/ epinephrine 1-100,000 local infiltration Curettage performed in three different directions: Yes   Curettage cycles:  3 Margin per side (cm):  0.1 Hemostasis achieved with:  aluminum chloride and ferric subsulfate Outcome: patient tolerated procedure well with no complications   Post-procedure details: wound care instructions given   Additional details:  Wound innoculated with 5 fluorouracil solution.  Skin excision  Lesion length (cm):  1.1 Lesion width (cm):  0.7 Margin per side (cm):  0.1 Total excision diameter (cm):  1.3 Informed consent: discussed and consent obtained   Timeout: patient name, date of birth, surgical site, and procedure verified   Anesthesia: the lesion was anesthetized in a standard fashion   Anesthetic:  1% lidocaine w/ epinephrine 1-100,000 local infiltration Instrument used: #15 blade   Hemostasis achieved with: pressure and electrodesiccation   Outcome: patient tolerated  procedure well with no complications   Post-procedure details: sterile dressing applied and wound care instructions given   Dressing type: bandage, petrolatum and pressure dressing   Additional details:  5-0 vicryl x 1 5-0 Ethilon x 3   Skin repair Complexity:  Intermediate Final length (cm):  1.8 Informed consent: discussed and consent obtained   Reason for type of repair: reduce tension to allow closure and reduce the risk of dehiscence, infection, and necrosis   Subcutaneous layers (deep stitches):  Suture size:  5-0 Suture type: Vicryl (polyglactin 910)   Fine/surface layer approximation (top stitches):  Suture size:  5-0 Suture type: nylon    Specimen 1 - Surgical pathology Differential Diagnosis: bcc [id number]  Superior Margin stained   Check Margins: No  Curettage showed deep involvement so after triple curettage and cautery of the base and margins, narrow margin elliptical excision was performed followed by layered closure.      I, Lavonna Monarch, MD, have reviewed all documentation for this visit.  The documentation on 05/26/21 for the exam, diagnosis, procedures, and orders are all accurate and complete.

## 2021-06-12 ENCOUNTER — Encounter: Payer: Self-pay | Admitting: Internal Medicine

## 2021-06-12 ENCOUNTER — Other Ambulatory Visit: Payer: Self-pay | Admitting: Internal Medicine

## 2021-06-12 ENCOUNTER — Ambulatory Visit (INDEPENDENT_AMBULATORY_CARE_PROVIDER_SITE_OTHER): Payer: Medicare Other | Admitting: Internal Medicine

## 2021-06-12 ENCOUNTER — Other Ambulatory Visit: Payer: Self-pay

## 2021-06-12 VITALS — BP 142/88 | HR 75 | Temp 98.5°F | Ht 70.0 in | Wt 158.0 lb

## 2021-06-12 DIAGNOSIS — I2510 Atherosclerotic heart disease of native coronary artery without angina pectoris: Secondary | ICD-10-CM | POA: Diagnosis not present

## 2021-06-12 DIAGNOSIS — M533 Sacrococcygeal disorders, not elsewhere classified: Secondary | ICD-10-CM | POA: Diagnosis not present

## 2021-06-12 DIAGNOSIS — I10 Essential (primary) hypertension: Secondary | ICD-10-CM

## 2021-06-12 MED ORDER — METHOCARBAMOL 500 MG PO TABS: 500.0000 mg | ORAL_TABLET | Freq: Four times a day (QID) | ORAL | 0 refills | Status: AC | PRN

## 2021-06-12 NOTE — Progress Notes (Signed)
Subjective:    Patient ID: Henry [name], male    DOB: 01/22/1956, 65 y.o.   MRN: [id number]  This visit occurred during the SARS-CoV-2 public health emergency.  Safety protocols were in place, including screening questions prior to the visit, additional usage of staff PPE, and extensive cleaning of exam room while observing appropriate contact time as indicated for disinfecting solutions.    HPI The patient is here for an acute visit.   He has severe scoliosis in his lumbar spine.  He follows with Dr. Beatriz Stallion.  He takes tramadol for his pain.     1 month ago pulled a muscle in his sacral area.  He has been moving and he thinks he probably overdid it.  It got better, but then got worse again.   If he reaches and does certain movements he can feel it pulling sensation again.  He really thinks it is a muscle strain or pulled muscle.  He has been trying to take it easy, but it is difficult since he just moved into his new place.  He denies any numbness, tingling or radiation of the pain.  He takes tramadol from dr cram and that helps.  He occasionally takes ibuprofen.     Medications and allergies reviewed with patient and updated if appropriate.  Patient Active Problem List   Diagnosis Date Noted   Myasthenia gravis ([place]) 01/16/2021   Ptosis of eyelid, left 01/16/2021   Vitamin D insufficiency 05/25/2020   Osteoporosis without current pathological fracture 05/25/2020   Hyperlipidemia LDL goal <130 06/08/2019   Benign paroxysmal positional vertigo 06/08/2019   Spinal stenosis in cervical region 04/13/2019   Herpes simplex infection of penis 11/25/2018   Left lumbar radiculitis 11/17/2018   Benign prostatic hyperplasia without lower urinary tract symptoms 11/17/2018   Current moderate episode of major depressive disorder without prior episode ([place]) 11/17/2018   Routine general medical examination at a health care facility 11/17/2018   Essential hypertension 05/02/2018     Current Outpatient Medications on File Prior to Visit  Medication Sig Dispense Refill   alendronate (FOSAMAX) 70 MG tablet Take 1 tablet (70 mg total) by mouth every 7 (seven) days. Take with a full glass of water on an empty stomach. 13 tablet 3   allopurinol (ZYLOPRIM) 100 MG tablet Take 1 tablet (100 mg total) by mouth daily. 30 tablet 6   colchicine 0.6 MG tablet Take 1.2 mg now and 0.6 mg one hour later. Take 0.6 mg daily after that x 5 days. 20 tablet 1   etodolac (LODINE) 400 MG tablet Take 1 tablet (400 mg total) by mouth 2 (two) times daily. 60 tablet 11   ibuprofen (ADVIL) 200 MG tablet Take 400 mg by mouth every 6 (six) hours as needed. Take 2 tablets (400mg  total) by mouth every 4 hours as needed for moderate pain.     lisinopril (ZESTRIL) 40 MG tablet TAKE 1 TABLET BY MOUTH EVERY DAY 90 tablet 1   pyridostigmine (MESTINON) 60 MG tablet Take 1 tablet (60 mg total) by mouth 3 (three) times daily. 270 tablet 1   traMADol (ULTRAM) 50 MG tablet Take 50 mg by mouth every 6 (six) hours as needed.     traMADol (ULTRAM) 50 MG tablet Take 1 tablet by mouth every 6 (six) hours as needed.     No current facility-administered medications on file prior to visit.    Past Medical History:  Diagnosis Date   Allergy  Anemia    Arthritis    Basal cell carcinoma 02/22/2021   nod-left anterior mandible (CX35FU + exc)   Depression    due to long tern hepatitis C - once the Hep C was cured, had not any issues until last year Nov 25, 2017) when is wife died   History of hepatitis C    has been treated in 11/26/2003   Hypertension    Pneumonia    as a teenager   Scoliosis     Past Surgical History:  Procedure Laterality Date   ANTERIOR CERVICAL DECOMP/DISCECTOMY FUSION N/A 04/13/2019   Procedure: ANTERIOR CERVICAL DECOMPRESSION/DISCECTOMY FUSION CERVICAL FOUR-FIVE, CERVICAL FIVE-SIX, CERVICAL SIX-SEVEN;  Surgeon: Kary Kos, MD;  Location: [place];  Service: Neurosurgery;  Laterality: N/A;    COLONOSCOPY     RADIOLOGY WITH ANESTHESIA N/A 03/31/2019   Procedure: MRI WITH ANESTHESIA LUMBAR WITHOUT CONTRAST AND CERVICAL WITHOUT CONTRAST;  Surgeon: Radiologist, Medication, MD;  Location: [place];  Service: Radiology;  Laterality: N/A;   TONSILLECTOMY AND ADENOIDECTOMY      Social History   Socioeconomic History   Marital status: Widowed    Spouse name: Not on file   Number of children: Not on file   Years of education: Not on file   Highest education level: Not on file  Occupational History   Not on file  Tobacco Use   Smoking status: Former    Years: 24.00    Types: Cigarettes   Smokeless tobacco: Never   Tobacco comments:    light smoker when I smoked  Vaping Use   Vaping Use: Never used  Substance and Sexual Activity   Alcohol use: Yes    Alcohol/week: 35.0 standard drinks    Types: 21 Cans of beer, 14 Standard drinks or equivalent per week    Comment: 2-3 drinks per day   Drug use: Yes    Types: Marijuana    Comment: every once in a while per pt   Sexual activity: Not Currently  Other Topics Concern   Not on file  Social History Narrative   Right handed   1 cup coffee every morning, ice tea w/ lunch, no soda   Social Determinants of Health   Financial Resource Strain: Not on file  Food Insecurity: Not on file  Transportation Needs: Not on file  Physical Activity: Not on file  Stress: Not on file  Social Connections: Not on file    Family History  Problem Relation Age of Onset   Hypertension Mother    Hypertension Father    Alcoholism Brother     Review of Systems  Constitutional:  Negative for fever.  Musculoskeletal:  Positive for back pain (chronic lower back pain) and myalgias.  Neurological:  Negative for weakness and numbness.      Objective:   Vitals:   06/12/21 1614  BP: (!) 142/88  Pulse: 75  Temp: 98.5 F (36.9 C)  SpO2: 97%   BP Readings from Last 3 Encounters:  06/12/21 (!) 142/88  05/09/21 (!) 162/80  01/16/21 (!) 174/89    Wt Readings from Last 3 Encounters:  06/12/21 158 lb (71.7 kg)  05/09/21 154 lb (69.9 kg)  01/16/21 160 lb 8 oz (72.8 kg)   Body mass index is 22.67 kg/m.   Physical Exam Constitutional:      Appearance: Normal appearance.  Musculoskeletal:        General: Deformity (Severe scoliosis lumbar spine) present. No swelling or tenderness (No tenderness with palpation sacral area, but area  that he feels the pulling or discomfort is his right sacrum).     Right lower leg: No edema.     Left lower leg: No edema.  Skin:    General: Skin is warm and dry.  Neurological:     Mental Status: He is alert.     Sensory: No sensory deficit.     Motor: No weakness.           Assessment & Plan:    Pain in sacrum: Acute Started 1 month ago and the pain did get better, but worsened again with increased activity Likely related to his severe lumbar scoliosis Not sure if this is a muscle strain or not, but I think it is reasonable to try a muscle relaxer-start methocarbamol 500 mg 4 times daily as needed He does follow with Dr. Saintclair Halsted and this could be further evaluated with him or if his pain is not improving he can return for imaging He will try ice alternating with heat

## 2021-06-12 NOTE — Patient Instructions (Addendum)
    Medications changes include :   methocarbamol 500 mg 4 times a day as needed --- muscle relaxer  Your prescription(s) have been submitted to your pharmacy. Please take as directed and contact our office if you believe you are having problem(s) with the medication(s).

## 2021-06-15 ENCOUNTER — Other Ambulatory Visit: Payer: Self-pay | Admitting: Internal Medicine

## 2021-06-15 DIAGNOSIS — I10 Essential (primary) hypertension: Secondary | ICD-10-CM

## 2021-06-16 ENCOUNTER — Other Ambulatory Visit: Payer: Self-pay | Admitting: Internal Medicine

## 2021-06-16 DIAGNOSIS — I10 Essential (primary) hypertension: Secondary | ICD-10-CM

## 2021-07-06 ENCOUNTER — Telehealth: Payer: Self-pay | Admitting: Internal Medicine

## 2021-07-06 NOTE — Telephone Encounter (Signed)
Patient notified

## 2021-07-06 NOTE — Telephone Encounter (Signed)
Vm left for patient to call back.

## 2021-07-06 NOTE — Telephone Encounter (Signed)
Pt called and requested a bone density exam. Pt under the impression that one was going to be set up for him. Pt contact [phone]

## 2021-07-10 ENCOUNTER — Other Ambulatory Visit: Payer: Self-pay | Admitting: Internal Medicine

## 2021-07-10 DIAGNOSIS — I10 Essential (primary) hypertension: Secondary | ICD-10-CM

## 2021-07-31 ENCOUNTER — Ambulatory Visit (INDEPENDENT_AMBULATORY_CARE_PROVIDER_SITE_OTHER): Payer: Medicare Other | Admitting: Neurology

## 2021-07-31 ENCOUNTER — Encounter: Payer: Self-pay | Admitting: Neurology

## 2021-07-31 VITALS — BP 176/100 | HR 60 | Ht 70.0 in | Wt 165.0 lb

## 2021-07-31 DIAGNOSIS — M5416 Radiculopathy, lumbar region: Secondary | ICD-10-CM | POA: Diagnosis not present

## 2021-07-31 DIAGNOSIS — H02402 Unspecified ptosis of left eyelid: Secondary | ICD-10-CM

## 2021-07-31 DIAGNOSIS — G7000 Myasthenia gravis without (acute) exacerbation: Secondary | ICD-10-CM

## 2021-07-31 DIAGNOSIS — M810 Age-related osteoporosis without current pathological fracture: Secondary | ICD-10-CM | POA: Diagnosis not present

## 2021-07-31 DIAGNOSIS — I2510 Atherosclerotic heart disease of native coronary artery without angina pectoris: Secondary | ICD-10-CM

## 2021-07-31 NOTE — Progress Notes (Signed)
[facility]  PATIENT: Henry [name] DOB: 09-Jun-1956   REFERRING DOCTOR OR PCP:   Theodosia Quay, PA-C [facility] ophthalmology); Dr. [name] )(PCP) SOURCE: patient, lab reports, notes from PCP  _________________________________   HISTORICAL  CHIEF COMPLAINT:  Chief Complaint  Patient presents with   Follow-up    Rm 2, alone. Here for 6 month MG f/u. Pt reports L eye has been better last 3 months. Overall doing well.     HISTORY OF PRESENT ILLNESS:   Henry [name], at [facility] for neurologic consultation regarding his left ptosis and recent diagnosis of myasthenia gravis.  Update 07/31/2021: He feels his MG improved and he no longer has ptosis.   He was able to stopped the pridostigmine.     He has occasioanl mild left > right  ptosis.    No diplopia.   We discussed medical options.     He is active and walks, rides bike and goes dancing.      He has spine issues/scoliois, currently with back pain,   He takes tramadol 50 mg a day and Ibuprofen with some benefit.   He has had fusion in his cervical spine.    Fusion in his back has been delayed due to osteoporosis (on med's).   He tries to stay active.     His vertigo resolved.     Chest CT showed no thymoma.   He had aortic valve calcification and saw cardiology.  No intervention is necessary.    MG History: Henry [name] had the onset of left ptosis in mid February 2022.   While driving that day, he noticed the ptosis, especially when light was brighter.   He saw Dr. Katy Fitch who was concerned about myasthenia gravis.   He returned a few days later and had an ice pack test which improved the ocular symptoms.    He notes eyes are not focusing well.   He has reduced focusing more than ptosis.    Antibodies were present against the acetylcholine receptor, consistent with myasthenia gravis.   Due to the osteoporosis, steroids have not been started.   He had hep C in the past that was cured with  treatment..    He has had cervical fusion C4-C7 04/2019 (Dr. Saintclair Halsted).   He has scoliosis and may need additional surgery.   He has  some arthritic issues in knees and shoulders.  He is active and walks 3 times a week and rides bicycles and goes dancing.    Recent labs 11/10/20 labs showed TSH, CBC, BMP were normal or non-contributary.  05/24/2020 labs Vit D was mildly low (26) 10/25/2020 AChR binding and blocking antibodies are both elevated (binding 5.46 , < 0.4 normal); blocking 40, < 30 normal)  REVIEW OF SYSTEMS: Constitutional: No fevers, chills, sweats, or change in appetite Eyes: No visual changes, double vision, eye pain Ear, nose and throat: No hearing loss, ear pain, nasal congestion, sore throat Cardiovascular: No chest pain, palpitations Respiratory:  No shortness of breath at rest or with exertion.   No wheezes GastrointestinaI: No nausea, vomiting, diarrhea, abdominal pain, fecal incontinence Genitourinary:  No dysuria, urinary retention or frequency.  No nocturia. Musculoskeletal: Notes arthritis in shoulders and knees.  Has osteoporosis. Integumentary: No rash, pruritus, skin lesions Neurological: as above Psychiatric: No depression at this time.  No anxiety Endocrine: No palpitations, diaphoresis, change in appetite, change in weigh or increased thirst Hematologic/Lymphatic:  No anemia, purpura, petechiae. Allergic/Immunologic: No itchy/runny eyes, nasal  congestion, recent allergic reactions, rashes  ALLERGIES: Allergies  Allergen Reactions   Valacyclovir Rash    HOME MEDICATIONS:  Current Outpatient Medications:    alendronate (FOSAMAX) 70 MG tablet, Take 1 tablet (70 mg total) by mouth every 7 (seven) days. Take with a full glass of water on an empty stomach., Disp: 13 tablet, Rfl: 3   allopurinol (ZYLOPRIM) 100 MG tablet, Take 1 tablet (100 mg total) by mouth daily., Disp: 30 tablet, Rfl: 6   colchicine 0.6 MG tablet, Take 1.2 mg now and 0.6 mg one hour later. Take 0.6  mg daily after that x 5 days., Disp: 20 tablet, Rfl: 1   etodolac (LODINE) 400 MG tablet, Take 1 tablet (400 mg total) by mouth 2 (two) times daily., Disp: 60 tablet, Rfl: 11   ibuprofen (ADVIL) 200 MG tablet, Take 400 mg by mouth every 6 (six) hours as needed. Take 2 tablets (400mg  total) by mouth every 4 hours as needed for moderate pain., Disp: , Rfl:    lisinopril (ZESTRIL) 40 MG tablet, TAKE 1 TABLET BY MOUTH EVERY DAY, Disp: 90 tablet, Rfl: 0   methocarbamol (ROBAXIN) 500 MG tablet, Take 1 tablet (500 mg total) by mouth every 6 (six) hours as needed for muscle spasms., Disp: 40 tablet, Rfl: 0   pyridostigmine (MESTINON) 60 MG tablet, Take 1 tablet (60 mg total) by mouth 3 (three) times daily., Disp: 270 tablet, Rfl: 1   traMADol (ULTRAM) 50 MG tablet, Take 50 mg by mouth every 6 (six) hours as needed., Disp: , Rfl:    traMADol (ULTRAM) 50 MG tablet, Take 1 tablet by mouth every 6 (six) hours as needed., Disp: , Rfl:   PAST MEDICAL HISTORY: Past Medical History:  Diagnosis Date   Allergy    Anemia    Arthritis    Basal cell carcinoma 02/22/2021   nod-left anterior mandible (CX35FU + exc)   Depression    due to long tern hepatitis C - once the Hep C was cured, had not any issues until last year 11/27/17) when is wife died   History of hepatitis C    has been treated in 11-28-03   Hypertension    Pneumonia    as a teenager   Scoliosis     PAST SURGICAL HISTORY: Past Surgical History:  Procedure Laterality Date   ANTERIOR CERVICAL DECOMP/DISCECTOMY FUSION N/A 04/13/2019   Procedure: ANTERIOR CERVICAL DECOMPRESSION/DISCECTOMY FUSION CERVICAL FOUR-FIVE, CERVICAL FIVE-SIX, CERVICAL SIX-SEVEN;  Surgeon: Kary Kos, MD;  Location: [place];  Service: Neurosurgery;  Laterality: N/A;   COLONOSCOPY     RADIOLOGY WITH ANESTHESIA N/A 03/31/2019   Procedure: MRI WITH ANESTHESIA LUMBAR WITHOUT CONTRAST AND CERVICAL WITHOUT CONTRAST;  Surgeon: Radiologist, Medication, MD;  Location: [place];  Service:  Radiology;  Laterality: N/A;   TONSILLECTOMY AND ADENOIDECTOMY      FAMILY HISTORY: Family History  Problem Relation Age of Onset   Hypertension Mother    Hypertension Father    Alcoholism Brother     SOCIAL HISTORY:  Social History   Socioeconomic History   Marital status: Widowed    Spouse name: Not on file   Number of children: Not on file   Years of education: Not on file   Highest education level: Not on file  Occupational History   Not on file  Tobacco Use   Smoking status: Former    Years: 24.00    Types: Cigarettes   Smokeless tobacco: Never   Tobacco comments:    light  smoker when I smoked  Vaping Use   Vaping Use: Never used  Substance and Sexual Activity   Alcohol use: Yes    Alcohol/week: 35.0 standard drinks    Types: 21 Cans of beer, 14 Standard drinks or equivalent per week    Comment: 2-3 drinks per day   Drug use: Yes    Types: Marijuana    Comment: every once in a while per pt   Sexual activity: Not Currently  Other Topics Concern   Not on file  Social History Narrative   Right handed   1 cup coffee every morning, ice tea w/ lunch, no soda   Social Determinants of Health   Financial Resource Strain: Not on file  Food Insecurity: Not on file  Transportation Needs: Not on file  Physical Activity: Not on file  Stress: Not on file  Social Connections: Not on file  Intimate Partner Violence: Not on file     PHYSICAL EXAM  Vitals:   07/31/21 0944 07/31/21 0948  BP: (!) 198/105 (!) 176/100  Pulse: 60   Weight: 165 lb (74.8 kg)   Height: 5\' 10"  (1.778 m)     Body mass index is 23.68 kg/m.   General: The patient is well-developed and well-nourished and in no acute distress  HEENT:  Head is [place]/AT.  Sclera are anicteric.  Skin/back: Extremities are without rash or  edema.  He has scoliosis  Neurologic Exam  Mental status: The patient is alert and oriented x 3 at the time of the examination. The patient has apparent normal recent  and remote memory, with an apparently normal attention span and concentration ability.   Speech is normal.  Cranial nerves: He has mild left ptosis.  Extraocular muscles were intact..  No double vision.  Facial strength and sensation was normal.  No dysarthria.  No obvious hearing deficits are noted.  Motor:  Muscle bulk is normal.   Tone is normal. Strength is  5 / 5 in all 4 extremities.  He can easily stand up from a chair without using his arms.  Sensory: Sensory testing is intact to soft touch and vibration sensation in all 4 extremities.  Coordination: Cerebellar testing reveals good finger-nose-finger and heel-to-shin bilaterally.  Gait and station: Station is normal.   Gait is normal. Tandem gait is normal. Romberg is negative.   Reflexes: Deep tendon reflexes are symmetric and normal bilaterally.       DIAGNOSTIC DATA (LABS, IMAGING, TESTING) - I reviewed patient records, labs, notes, testing and imaging myself where available.  Lab Results  Component Value Date   WBC 7.6 11/10/2020   HGB 13.9 11/10/2020   HCT 40.8 11/10/2020   MCV 87.0 11/10/2020   PLT 270.0 11/10/2020      Component Value Date/Time   NA 140 05/09/2021 1515   K 3.8 05/09/2021 1515   CL 105 05/09/2021 1515   CO2 27 05/09/2021 1515   GLUCOSE 86 05/09/2021 1515   BUN 16 05/09/2021 1515   CREATININE 0.77 05/09/2021 1515   CREATININE 0.85 05/24/2020 1059   CALCIUM 9.3 05/09/2021 1515   PROT 6.9 05/09/2021 1515   ALBUMIN 3.9 05/09/2021 1515   AST 18 05/09/2021 1515   ALT 11 05/09/2021 1515   ALKPHOS 55 05/09/2021 1515   BILITOT 0.9 05/09/2021 1515   GFRNONAA >60 03/31/2019 0714   GFRAA >60 03/31/2019 0714   Lab Results  Component Value Date   CHOL 135 11/10/2020   HDL 68.90 11/10/2020   LDLCALC  51 11/10/2020   TRIG 73.0 11/10/2020   CHOLHDL 2 11/10/2020   Lab Results  Component Value Date   HGBA1C 5.0 04/13/2019   Lab Results  Component Value Date   ZESPQZRA07 622 (H) 11/17/2018    Lab Results  Component Value Date   TSH 0.93 11/10/2020       ASSESSMENT AND PLAN  Myasthenia gravis ([place])  Ptosis of eyelid, left  Osteoporosis without current pathological fracture, unspecified osteoporosis type   1.   He has improved and was able to stop the Mestinon.  He continues to have some ptosis but it is tolerable.  He is not having diplopia or proximal weakness.  He will remain off of Mestinon but restart if symptoms worsen and let us know.  If worsens to include proximal weakness or bulbar weakness, we will add an immunotherapy.  He has osteoporosis so I would avoid to add steroids.  We could initiate either Imuran or CellCept if needed.  Additionally we could consider IVIG to give some benefit until the immunosuppressive therapy becomes effective if he has symptoms outside of the ptosis.  2.   He will continue tramadol and ibuprofen for his back and scoliosis..    3.  F/u 1 year or  sooner if new or worsenig symptoms.       Shirin Echeverry A. Felecia Shelling, MD, [facility] 63/33/5456, 25:63 AM Certified in Neurology, Clinical Neurophysiology, Sleep Medicine and Neuroimaging  [facility] [address], [place], [place] [address] [phone]

## 2021-10-05 ENCOUNTER — Other Ambulatory Visit: Payer: Self-pay | Admitting: Internal Medicine

## 2021-10-05 DIAGNOSIS — I10 Essential (primary) hypertension: Secondary | ICD-10-CM

## 2021-12-08 ENCOUNTER — Other Ambulatory Visit: Payer: Self-pay | Admitting: Internal Medicine

## 2021-12-08 DIAGNOSIS — I10 Essential (primary) hypertension: Secondary | ICD-10-CM

## 2022-01-05 ENCOUNTER — Other Ambulatory Visit: Payer: Self-pay | Admitting: Internal Medicine

## 2022-01-25 ENCOUNTER — Ambulatory Visit (INDEPENDENT_AMBULATORY_CARE_PROVIDER_SITE_OTHER): Payer: Medicare Other | Admitting: Internal Medicine

## 2022-01-25 ENCOUNTER — Ambulatory Visit (INDEPENDENT_AMBULATORY_CARE_PROVIDER_SITE_OTHER): Payer: Medicare Other

## 2022-01-25 ENCOUNTER — Encounter: Payer: Self-pay | Admitting: Internal Medicine

## 2022-01-25 VITALS — BP 166/90 | HR 68 | Temp 98.1°F | Ht 70.0 in | Wt 167.0 lb

## 2022-01-25 DIAGNOSIS — M25551 Pain in right hip: Secondary | ICD-10-CM

## 2022-01-25 DIAGNOSIS — IMO0002 Reserved for concepts with insufficient information to code with codable children: Secondary | ICD-10-CM

## 2022-01-25 DIAGNOSIS — I10 Essential (primary) hypertension: Secondary | ICD-10-CM

## 2022-01-25 DIAGNOSIS — R0609 Other forms of dyspnea: Secondary | ICD-10-CM | POA: Diagnosis not present

## 2022-01-25 DIAGNOSIS — N400 Enlarged prostate without lower urinary tract symptoms: Secondary | ICD-10-CM

## 2022-01-25 DIAGNOSIS — R9430 Abnormal result of cardiovascular function study, unspecified: Secondary | ICD-10-CM

## 2022-01-25 DIAGNOSIS — I119 Hypertensive heart disease without heart failure: Secondary | ICD-10-CM

## 2022-01-25 DIAGNOSIS — E785 Hyperlipidemia, unspecified: Secondary | ICD-10-CM | POA: Diagnosis not present

## 2022-01-25 DIAGNOSIS — M1A09X0 Idiopathic chronic gout, multiple sites, without tophus (tophi): Secondary | ICD-10-CM

## 2022-01-25 DIAGNOSIS — K635 Polyp of colon: Secondary | ICD-10-CM | POA: Insufficient documentation

## 2022-01-25 DIAGNOSIS — M109 Gout, unspecified: Secondary | ICD-10-CM

## 2022-01-25 LAB — BASIC METABOLIC PANEL
BUN: 22 mg/dL (ref 6–23)
CO2: 29 mEq/L (ref 19–32)
Calcium: 9.4 mg/dL (ref 8.4–10.5)
Chloride: 104 mEq/L (ref 96–112)
Creatinine, Ser: 1.03 mg/dL (ref 0.40–1.50)
GFR: 74.81 mL/min (ref >60.00)
Glucose, Bld: 85 mg/dL (ref 70–99)
Potassium: 4.3 mEq/L (ref 3.5–5.1)
Sodium: 139 mEq/L (ref 135–145)

## 2022-01-25 LAB — TSH: TSH: 0.62 u[IU]/mL (ref 0.35–5.50)

## 2022-01-25 LAB — HEPATIC FUNCTION PANEL
ALT: 14 U/L (ref 0–53)
AST: 21 U/L (ref 0–37)
Albumin: 4.3 g/dL (ref 3.5–5.2)
Alkaline Phosphatase: 75 U/L (ref 39–117)
Bilirubin, Direct: 0.3 mg/dL (ref 0.0–0.3)
Total Bilirubin: 1 mg/dL (ref 0.2–1.2)
Total Protein: 6.9 g/dL (ref 6.0–8.3)

## 2022-01-25 LAB — CBC WITH DIFFERENTIAL/PLATELET
Basophils Absolute: 0 10*3/uL (ref 0.0–0.1)
Basophils Relative: 0.4 % (ref 0.0–3.0)
Eosinophils Absolute: 0.4 10*3/uL (ref 0.0–0.7)
Eosinophils Relative: 5.2 % — ABNORMAL HIGH (ref 0.0–5.0)
HCT: 42.6 % (ref 39.0–52.0)
Hemoglobin: 14.1 g/dL (ref 13.0–17.0)
Lymphocytes Relative: 20.7 % (ref 12.0–46.0)
Lymphs Abs: 1.5 10*3/uL (ref 0.7–4.0)
MCHC: 33.2 g/dL (ref 30.0–36.0)
MCV: 89.4 fl (ref 78.0–100.0)
Monocytes Absolute: 0.7 10*3/uL (ref 0.1–1.0)
Monocytes Relative: 8.8 % (ref 3.0–12.0)
Neutro Abs: 4.8 10*3/uL (ref 1.4–7.7)
Neutrophils Relative %: 64.9 % (ref 43.0–77.0)
Platelets: 265 10*3/uL (ref 150.0–400.0)
RBC: 4.76 Mil/uL (ref 4.22–5.81)
RDW: 14.7 % (ref 11.5–15.5)
WBC: 7.4 10*3/uL (ref 4.0–10.5)

## 2022-01-25 LAB — LIPID PANEL
Cholesterol: 137 mg/dL (ref 0–200)
HDL: 63.8 mg/dL (ref >39.00)
LDL Cholesterol: 40 mg/dL (ref 0–99)
NonHDL: 72.82
Total CHOL/HDL Ratio: 2
Triglycerides: 166 mg/dL — ABNORMAL HIGH (ref 0.0–149.0)
VLDL: 33.2 mg/dL (ref 0.0–40.0)

## 2022-01-25 LAB — URINALYSIS, ROUTINE W REFLEX MICROSCOPIC
Hgb urine dipstick: NEGATIVE
Leukocytes,Ua: NEGATIVE
Nitrite: NEGATIVE
Specific Gravity, Urine: >=1.03 — AB (ref 1.000–1.030)
Total Protein, Urine: NEGATIVE
Urine Glucose: NEGATIVE
Urobilinogen, UA: 1 (ref 0.0–1.0)
pH: 5.5 (ref 5.0–8.0)

## 2022-01-25 LAB — BRAIN NATRIURETIC PEPTIDE: Pro B Natriuretic peptide (BNP): 50 pg/mL (ref 0.0–100.0)

## 2022-01-25 LAB — PSA: PSA: 1.04 ng/mL (ref 0.10–4.00)

## 2022-01-25 LAB — TROPONIN I (HIGH SENSITIVITY): High Sens Troponin I: 7 ng/L (ref 2–17)

## 2022-01-25 MED ORDER — ALLOPURINOL 100 MG PO TABS: 100.0000 mg | ORAL_TABLET | Freq: Every day | ORAL | 1 refills | Status: DC

## 2022-01-25 MED ORDER — AZILSARTAN-CHLORTHALIDONE 40-12.5 MG PO TABS: 1.0000 | ORAL_TABLET | Freq: Every day | ORAL | 0 refills | Status: DC

## 2022-01-25 MED ORDER — ROSUVASTATIN CALCIUM 5 MG PO TABS: 5.0000 mg | ORAL_TABLET | Freq: Every day | ORAL | 1 refills | Status: DC

## 2022-01-25 NOTE — Progress Notes (Signed)
Subjective:  Patient ID: Henry [name], male    DOB: 12-16-1955  Age: 66 y.o. MRN: [id number]  CC: Hypertension   HPI Henry [name] presents for f/up -  He is aware that his blood pressure is not well controlled and he complains of a 14-monthhistory of dyspnea on exertion.  He denies headache, blurred vision, chest pain, diaphoresis, dizziness, lightheadedness, or edema.  He has active prescriptions for 2 NSAIDs.  Outpatient Medications Prior to Visit  Medication Sig Dispense Refill   alendronate (FOSAMAX) 70 MG tablet TAKE 1 TABLET (70 MG TOTAL) BY MOUTH EVERY 7 DAYS WITH FULL GLASS WATER ON EMPTY STOMACH 4 tablet 0   methocarbamol (ROBAXIN) 500 MG tablet Take 1 tablet (500 mg total) by mouth every 6 (six) hours as needed for muscle spasms. 40 tablet 0   pyridostigmine (MESTINON) 60 MG tablet Take 1 tablet (60 mg total) by mouth 3 (three) times daily. 270 tablet 1   traMADol (ULTRAM) 50 MG tablet Take 50 mg by mouth every 6 (six) hours as needed.     traMADol (ULTRAM) 50 MG tablet Take 1 tablet by mouth every 6 (six) hours as needed.     allopurinol (ZYLOPRIM) 100 MG tablet Take 1 tablet (100 mg total) by mouth daily. 30 tablet 6   colchicine 0.6 MG tablet Take 1.2 mg now and 0.6 mg one hour later. Take 0.6 mg daily after that x 5 days. 20 tablet 1   etodolac (LODINE) 400 MG tablet Take 1 tablet (400 mg total) by mouth 2 (two) times daily. 60 tablet 11   ibuprofen (ADVIL) 200 MG tablet Take 400 mg by mouth every 6 (six) hours as needed. Take 2 tablets ('400mg'$  total) by mouth every 4 hours as needed for moderate pain.     lisinopril (ZESTRIL) 40 MG tablet TAKE 1 TABLET BY MOUTH EVERY DAY 90 tablet 0   No facility-administered medications prior to visit.    ROS Review of Systems  Constitutional:  Negative for chills, diaphoresis, fatigue and fever.  HENT: Negative.    Eyes: Negative.   Respiratory:  Positive for shortness of breath. Negative for cough, chest tightness and wheezing.    Cardiovascular:  Negative for chest pain, palpitations and leg swelling.  Gastrointestinal:  Negative for abdominal pain, diarrhea, nausea and vomiting.  Endocrine: Negative.   Genitourinary: Negative.  Negative for difficulty urinating.  Musculoskeletal:  Positive for arthralgias. Negative for back pain and myalgias.       Right hip pain for 5 days with no history of trauma or injury.  Neurological:  Negative for dizziness, weakness and light-headedness.  Hematological:  Negative for adenopathy. Does not bruise/bleed easily.  Psychiatric/Behavioral: Negative.     Objective:  BP (!) 166/90 (BP Location: Left Arm, Patient Position: Sitting, Cuff Size: Large)   Pulse 68   Temp 98.1 F (36.7 C) (Oral)   Ht '5\' 10"'$  (1.778 m)   Wt 167 lb (75.8 kg)   SpO2 95%   BMI 23.96 kg/m   BP Readings from Last 3 Encounters:  01/25/22 (!) 166/90  07/31/21 (!) 176/100  06/12/21 (!) 142/88    Wt Readings from Last 3 Encounters:  01/25/22 167 lb (75.8 kg)  07/31/21 165 lb (74.8 kg)  06/12/21 158 lb (71.7 kg)    Physical Exam Vitals reviewed.  HENT:     Nose: Nose normal.     Mouth/Throat:     Mouth: Mucous membranes are moist.  Eyes:  General: No scleral icterus.    Conjunctiva/sclera: Conjunctivae normal.  Cardiovascular:     Rate and Rhythm: Normal rate and regular rhythm.     Heart sounds: Normal heart sounds, S1 normal and S2 normal. No murmur heard.   No friction rub. No gallop.     Comments: EKG- NSR, 70 bpm Minimal LVH No Q waves or ST/T wave changes Pulmonary:     Effort: Pulmonary effort is normal.     Breath sounds: No stridor. No wheezing, rhonchi or rales.  Abdominal:     General: Abdomen is flat.     Palpations: There is no mass.     Tenderness: There is no abdominal tenderness. There is no guarding.     Hernia: No hernia is present.  Musculoskeletal:        General: No swelling.     Right hip: Normal.     Left hip: Normal.     Right lower leg: No edema.      Left lower leg: No edema.  Skin:    General: Skin is warm and dry.     Coloration: Skin is not pale.  Neurological:     General: No focal deficit present.     Mental Status: He is alert. Mental status is at baseline.  Psychiatric:        Mood and Affect: Mood normal.        Behavior: Behavior normal.    Lab Results  Component Value Date   WBC 7.4 01/25/2022   HGB 14.1 01/25/2022   HCT 42.6 01/25/2022   PLT 265.0 01/25/2022   GLUCOSE 85 01/25/2022   CHOL 137 01/25/2022   TRIG 166.0 (H) 01/25/2022   HDL 63.80 01/25/2022   LDLCALC 40 01/25/2022   ALT 14 01/25/2022   AST 21 01/25/2022   NA 139 01/25/2022   K 4.3 01/25/2022   CL 104 01/25/2022   CREATININE 1.03 01/25/2022   BUN 22 01/25/2022   CO2 29 01/25/2022   TSH 0.62 01/25/2022   PSA 1.04 01/25/2022   HGBA1C 5.0 04/13/2019    CT CHEST W CONTRAST  Result Date: 11/25/2020 CLINICAL DATA:  Myasthenia gravis. EXAM: CT CHEST WITH CONTRAST TECHNIQUE: Multidetector CT imaging of the chest was performed during intravenous contrast administration. CONTRAST:  66m ISOVUE-300 IOPAMIDOL (ISOVUE-300) INJECTION 61% COMPARISON:  None. FINDINGS: Cardiovascular: Atherosclerotic calcification of the aortic valve and coronary arteries. Heart size normal. No pericardial effusion. Mediastinum/Nodes: No pathologically enlarged mediastinal, hilar or axillary lymph nodes. No prevascular mass. Esophagus is grossly unremarkable. Large hiatal hernia. Lungs/Pleura: Scarring in the lingula and left lower lobe. Lungs are otherwise clear. No pleural fluid. Airway is unremarkable. Upper Abdomen: Visualized portions of the liver, adrenal glands, kidneys, spleen, pancreas, stomach and bowel are unremarkable with the exception of a large hiatal hernia. No upper abdominal adenopathy. Musculoskeletal: Degenerative changes and scoliosis in the spine. Old right rib fractures. IMPRESSION: 1. No evidence of a thymoma. 2. Large hiatal hernia. 3. Coronary artery  calcification. Electronically Signed   By: MLorin PicketM.D.   On: 11/25/2020 09:29   DG HIP UNILAT WITH PELVIS 2-3 VIEWS RIGHT  Result Date: 01/25/2022 CLINICAL DATA:  Right hip pain EXAM: DG HIP (WITH OR WITHOUT PELVIS) 2-3V RIGHT COMPARISON:  None Available. FINDINGS: There is no evidence of hip fracture or dislocation. There is no evidence of arthropathy or other focal bone abnormality. IMPRESSION: Negative. Electronically Signed   By: PMacy MisM.D.   On: 01/25/2022 15:55  Assessment & Plan:   Javaris was seen today for hypertension.  Diagnoses and all orders for this visit:  Acute right hip pain- Exam and x-ray are normal. -     DG HIP UNILAT WITH PELVIS 2-3 VIEWS RIGHT; Future  DOE (dyspnea on exertion)- His EKG shows LVH but his troponin and BNP are normal.  I have asked him to undergo a myocardial perfusion to evaluate for ischemia. -     EKG 12-Lead -     Brain natriuretic peptide; Future -     Troponin I (High Sensitivity); Future -     Troponin I (High Sensitivity) -     Brain natriuretic peptide -     MYOCARDIAL PERFUSION IMAGING; Future  Primary hypertension- His blood pressure is not well controlled and he has developed LVH.  Will discontinue the ACE inhibitor and upgrade to an ARB and thiazide diuretic. -     EKG 12-Lead -     Basic metabolic panel; Future -     CBC with Differential/Platelet; Future -     TSH; Future -     Urinalysis, Routine w reflex microscopic; Future -     Aldosterone + renin activity w/ ratio; Future -     Aldosterone + renin activity w/ ratio -     Urinalysis, Routine w reflex microscopic -     TSH -     CBC with Differential/Platelet -     Basic metabolic panel -     Azilsartan-Chlorthalidone 40-12.5 MG TABS; Take 1 tablet by mouth daily.  Benign prostatic hyperplasia without lower urinary tract symptoms- His PSA is normal. -     Urinalysis, Routine w reflex microscopic; Future -     PSA; Future -     PSA -     Urinalysis,  Routine w reflex microscopic  Hyperlipidemia LDL goal <130- I have asked him to take a statin for cardiovascular risk reduction. -     Lipid panel; Future -     Hepatic function panel; Future -     TSH; Future -     TSH -     Hepatic function panel -     Lipid panel -     rosuvastatin (CRESTOR) 5 MG tablet; Take 1 tablet (5 mg total) by mouth daily.  Polyp of colon, unspecified part of colon, unspecified type -     Ambulatory referral to Gastroenterology  Acute gouty arthritis  Idiopathic chronic gout of multiple sites without tophus -     allopurinol (ZYLOPRIM) 100 MG tablet; Take 1 tablet (100 mg total) by mouth daily.  Hypertensive left ventricular hypertrophy, without heart failure -     MYOCARDIAL PERFUSION IMAGING; Future   I have discontinued [name]. Setters's ibuprofen, etodolac, colchicine, and lisinopril. I am also having him start on rosuvastatin and Azilsartan-Chlorthalidone. Additionally, I am having him maintain his traMADol, traMADol, pyridostigmine, methocarbamol, alendronate, and allopurinol.  Meds ordered this encounter  Medications   rosuvastatin (CRESTOR) 5 MG tablet    Sig: Take 1 tablet (5 mg total) by mouth daily.    Dispense:  90 tablet    Refill:  1   Azilsartan-Chlorthalidone 40-12.5 MG TABS    Sig: Take 1 tablet by mouth daily.    Dispense:  90 tablet    Refill:  0   allopurinol (ZYLOPRIM) 100 MG tablet    Sig: Take 1 tablet (100 mg total) by mouth daily.    Dispense:  90 tablet  Refill:  1     Follow-up: Return in about 3 months (around 04/27/2022).  [name], MD

## 2022-01-25 NOTE — Patient Instructions (Signed)
Hypertension, Adult High blood pressure (hypertension) is when the force of blood pumping through the arteries is too strong. The arteries are the blood vessels that carry blood from the heart throughout the body. Hypertension forces the heart to work harder to pump blood and may cause arteries to become narrow or stiff. Untreated or uncontrolled hypertension can lead to a heart attack, heart failure, a stroke, kidney disease, and other problems. A blood pressure reading consists of a higher number over a lower number. Ideally, your blood pressure should be below 120/80. The first ("top") number is called the systolic pressure. It is a measure of the pressure in your arteries as your heart beats. The second ("bottom") number is called the diastolic pressure. It is a measure of the pressure in your arteries as the heart relaxes. What are the causes? The exact cause of this condition is not known. There are some conditions that result in high blood pressure. What increases the risk? Certain factors may make you more likely to develop high blood pressure. Some of these risk factors are under your control, including: Smoking. Not getting enough exercise or physical activity. Being overweight. Having too much fat, sugar, calories, or salt (sodium) in your diet. Drinking too much alcohol. Other risk factors include: Having a personal history of heart disease, diabetes, high cholesterol, or kidney disease. Stress. Having a family history of high blood pressure and high cholesterol. Having obstructive sleep apnea. Age. The risk increases with age. What are the signs or symptoms? High blood pressure may not cause symptoms. Very high blood pressure (hypertensive crisis) may cause: Headache. Fast or irregular heartbeats (palpitations). Shortness of breath. Nosebleed. Nausea and vomiting. Vision changes. Severe chest pain, dizziness, and seizures. How is this diagnosed? This condition is diagnosed by  measuring your blood pressure while you are seated, with your arm resting on a flat surface, your legs uncrossed, and your feet flat on the floor. The cuff of the blood pressure monitor will be placed directly against the skin of your upper arm at the level of your heart. Blood pressure should be measured at least twice using the same arm. Certain conditions can cause a difference in blood pressure between your right and left arms. If you have a high blood pressure reading during one visit or you have normal blood pressure with other risk factors, you may be asked to: Return on a different day to have your blood pressure checked again. Monitor your blood pressure at home for 1 week or longer. If you are diagnosed with hypertension, you may have other blood or imaging tests to help your health care provider understand your overall risk for other conditions. How is this treated? This condition is treated by making healthy lifestyle changes, such as eating healthy foods, exercising more, and reducing your alcohol intake. You may be referred for counseling on a healthy diet and physical activity. Your health care provider may prescribe medicine if lifestyle changes are not enough to get your blood pressure under control and if: Your systolic blood pressure is above 130. Your diastolic blood pressure is above 80. Your personal target blood pressure may vary depending on your medical conditions, your age, and other factors. Follow these instructions at home: Eating and drinking  Eat a diet that is high in fiber and potassium, and low in sodium, added sugar, and fat. An example of this eating plan is called the DASH diet. DASH stands for Dietary Approaches to Stop Hypertension. To eat this way: Eat   plenty of fresh fruits and vegetables. Try to fill one half of your plate at each meal with fruits and vegetables. Eat whole grains, such as whole-wheat pasta, brown rice, or whole-grain bread. Fill about one  fourth of your plate with whole grains. Eat or drink low-fat dairy products, such as skim milk or low-fat yogurt. Avoid fatty cuts of meat, processed or cured meats, and poultry with skin. Fill about one fourth of your plate with lean proteins, such as fish, chicken without skin, beans, eggs, or tofu. Avoid pre-made and processed foods. These tend to be higher in sodium, added sugar, and fat. Reduce your daily sodium intake. Many people with hypertension should eat less than 1,500 mg of sodium a day. Do not drink alcohol if: Your health care provider tells you not to drink. You are pregnant, may be pregnant, or are planning to become pregnant. If you drink alcohol: Limit how much you have to: 0-1 drink a day for women. 0-2 drinks a day for men. Know how much alcohol is in your drink. In the [place] one drink equals one 12 oz bottle of beer (355 mL), one 5 oz glass of wine (148 mL), or one 1 oz glass of hard liquor (44 mL). Lifestyle  Work with your health care provider to maintain a healthy body weight or to lose weight. Ask what an ideal weight is for you. Get at least 30 minutes of exercise that causes your heart to beat faster (aerobic exercise) most days of the week. Activities may include walking, swimming, or biking. Include exercise to strengthen your muscles (resistance exercise), such as Pilates or lifting weights, as part of your weekly exercise routine. Try to do these types of exercises for 30 minutes at least 3 days a week. Do not use any products that contain nicotine or tobacco. These products include cigarettes, chewing tobacco, and vaping devices, such as e-cigarettes. If you need help quitting, ask your health care provider. Monitor your blood pressure at home as told by your health care provider. Keep all follow-up visits. This is important. Medicines Take over-the-counter and prescription medicines only as told by your health care provider. Follow directions carefully. Blood  pressure medicines must be taken as prescribed. Do not skip doses of blood pressure medicine. Doing this puts you at risk for problems and can make the medicine less effective. Ask your health care provider about side effects or reactions to medicines that you should watch for. Contact a health care provider if you: Think you are having a reaction to a medicine you are taking. Have headaches that keep coming back (recurring). Feel dizzy. Have swelling in your ankles. Have trouble with your vision. Get help right away if you: Develop a severe headache or confusion. Have unusual weakness or numbness. Feel faint. Have severe pain in your chest or abdomen. Vomit repeatedly. Have trouble breathing. These symptoms may be an emergency. Get help right away. Call 911. Do not wait to see if the symptoms will go away. Do not drive yourself to the hospital. Summary Hypertension is when the force of blood pumping through your arteries is too strong. If this condition is not controlled, it may put you at risk for serious complications. Your personal target blood pressure may vary depending on your medical conditions, your age, and other factors. For most people, a normal blood pressure is less than 120/80. Hypertension is treated with lifestyle changes, medicines, or a combination of both. Lifestyle changes include losing weight, eating a healthy,   low-sodium diet, exercising more, and limiting alcohol. This information is not intended to replace advice given to you by your health care provider. Make sure you discuss any questions you have with your health care provider. Document Revised: 07/04/2021 Document Reviewed: 07/04/2021 Elsevier Patient Education  2023 [employer]  

## 2022-01-26 NOTE — Addendum Note (Signed)
Addended by: Janith Lima on: 01/26/2022 11:18 AM   Modules accepted: Orders

## 2022-01-29 ENCOUNTER — Other Ambulatory Visit: Payer: Self-pay | Admitting: Internal Medicine

## 2022-02-07 LAB — ALDOSTERONE + RENIN ACTIVITY W/ RATIO
ALDO / PRA Ratio: 0.3 Ratio — ABNORMAL LOW (ref 0.9–28.9)
Aldosterone: 5 ng/dL
Renin Activity: 15.29 ng/mL/h — ABNORMAL HIGH (ref 0.25–5.82)

## 2022-02-13 ENCOUNTER — Other Ambulatory Visit: Payer: Self-pay | Admitting: Internal Medicine

## 2022-02-14 ENCOUNTER — Telehealth (HOSPITAL_COMMUNITY): Payer: Self-pay | Admitting: *Deleted

## 2022-02-14 NOTE — Telephone Encounter (Signed)
Close encounter 

## 2022-02-15 ENCOUNTER — Ambulatory Visit (HOSPITAL_COMMUNITY)
Admission: RE | Admit: 2022-02-15 | Discharge: 2022-02-15 | Disposition: A | Discharge status: Home / Self Care | Payer: Medicare Other | Source: Ambulatory Visit | Attending: Internal Medicine | Admitting: Internal Medicine

## 2022-02-15 DIAGNOSIS — I119 Hypertensive heart disease without heart failure: Secondary | ICD-10-CM | POA: Insufficient documentation

## 2022-02-15 DIAGNOSIS — R0609 Other forms of dyspnea: Secondary | ICD-10-CM | POA: Diagnosis present

## 2022-02-15 DIAGNOSIS — R9430 Abnormal result of cardiovascular function study, unspecified: Secondary | ICD-10-CM | POA: Insufficient documentation

## 2022-02-15 LAB — MYOCARDIAL PERFUSION IMAGING
LV dias vol: 121 mL (ref 62–150)
LV sys vol: 61 mL
Nuc Stress EF: 49 %
Peak HR: 87 {beats}/min
Rest HR: 60 {beats}/min
Rest Nuclear Isotope Dose: 10.4 mCi
SDS: 1
SRS: 5
SSS: 6
ST Depression (mm): 0 mm
Stress Nuclear Isotope Dose: 31.6 mCi
TID: 0.98

## 2022-02-15 MED ORDER — TECHNETIUM TC 99M TETROFOSMIN IV KIT
10.4000 | PACK | Freq: Once | INTRAVENOUS | Status: AC | PRN
Start: 2022-02-15 — End: 2022-02-15
  Administered 2022-02-15: 10.4 via INTRAVENOUS

## 2022-02-15 MED ORDER — TECHNETIUM TC 99M TETROFOSMIN IV KIT
31.6000 | PACK | Freq: Once | INTRAVENOUS | Status: AC | PRN
  Administered 2022-02-15: 31.6 via INTRAVENOUS

## 2022-02-15 MED ORDER — REGADENOSON 0.4 MG/5ML IV SOLN
0.4000 mg | Freq: Once | INTRAVENOUS | Status: AC
  Administered 2022-02-15: 0.4 mg via INTRAVENOUS

## 2022-02-15 NOTE — Addendum Note (Signed)
Addended by: Janith Lima on: 02/15/2022 04:44 PM   Modules accepted: Orders

## 2022-02-16 ENCOUNTER — Telehealth (HOSPITAL_BASED_OUTPATIENT_CLINIC_OR_DEPARTMENT_OTHER): Payer: Self-pay | Admitting: *Deleted

## 2022-02-16 NOTE — Telephone Encounter (Signed)
Left message for patient to call and schedule the Echocardiogram ordered by Dr. [name]

## 2022-02-21 NOTE — Telephone Encounter (Signed)
Left message for patient to call and discuss scheduling the Echocardiogram ordered by Dr. Thomas Jones 

## 2022-02-26 ENCOUNTER — Ambulatory Visit (INDEPENDENT_AMBULATORY_CARE_PROVIDER_SITE_OTHER): Payer: Medicare Other | Admitting: Dermatology

## 2022-02-26 ENCOUNTER — Encounter: Payer: Self-pay | Admitting: Dermatology

## 2022-02-26 DIAGNOSIS — Z1283 Encounter for screening for malignant neoplasm of skin: Secondary | ICD-10-CM | POA: Diagnosis not present

## 2022-02-26 DIAGNOSIS — L729 Follicular cyst of the skin and subcutaneous tissue, unspecified: Secondary | ICD-10-CM | POA: Diagnosis not present

## 2022-02-26 DIAGNOSIS — Z808 Family history of malignant neoplasm of other organs or systems: Secondary | ICD-10-CM | POA: Diagnosis not present

## 2022-02-26 DIAGNOSIS — Z85828 Personal history of other malignant neoplasm of skin: Secondary | ICD-10-CM

## 2022-02-26 DIAGNOSIS — D485 Neoplasm of uncertain behavior of skin: Secondary | ICD-10-CM

## 2022-02-26 DIAGNOSIS — D044 Carcinoma in situ of skin of scalp and neck: Secondary | ICD-10-CM

## 2022-02-26 NOTE — Patient Instructions (Signed)

## 2022-03-05 ENCOUNTER — Other Ambulatory Visit: Payer: Self-pay | Admitting: Internal Medicine

## 2022-03-05 ENCOUNTER — Telehealth: Payer: Self-pay

## 2022-03-05 DIAGNOSIS — M4802 Spinal stenosis, cervical region: Secondary | ICD-10-CM

## 2022-03-05 DIAGNOSIS — M5416 Radiculopathy, lumbar region: Secondary | ICD-10-CM

## 2022-03-05 DIAGNOSIS — I10 Essential (primary) hypertension: Secondary | ICD-10-CM

## 2022-03-05 MED ORDER — OLMESARTAN MEDOXOMIL 20 MG PO TABS: 20.0000 mg | ORAL_TABLET | Freq: Every day | ORAL | 0 refills | Status: DC

## 2022-03-05 MED ORDER — INDAPAMIDE 1.25 MG PO TABS: 1.2500 mg | ORAL_TABLET | Freq: Every day | ORAL | 0 refills | Status: DC

## 2022-03-05 MED ORDER — TRAMADOL HCL 50 MG PO TABS
50.0000 mg | ORAL_TABLET | Freq: Four times a day (QID) | ORAL | 0 refills | Status: DC | PRN
Start: 2022-03-05 — End: 2022-07-27

## 2022-03-06 ENCOUNTER — Other Ambulatory Visit: Payer: Self-pay | Admitting: Internal Medicine

## 2022-03-14 ENCOUNTER — Other Ambulatory Visit: Payer: Self-pay | Admitting: Internal Medicine

## 2022-03-14 ENCOUNTER — Ambulatory Visit (INDEPENDENT_AMBULATORY_CARE_PROVIDER_SITE_OTHER): Payer: Medicare Other

## 2022-03-14 DIAGNOSIS — I119 Hypertensive heart disease without heart failure: Secondary | ICD-10-CM | POA: Diagnosis not present

## 2022-03-14 DIAGNOSIS — R0609 Other forms of dyspnea: Secondary | ICD-10-CM

## 2022-03-14 DIAGNOSIS — R9430 Abnormal result of cardiovascular function study, unspecified: Secondary | ICD-10-CM | POA: Diagnosis not present

## 2022-03-14 DIAGNOSIS — I351 Nonrheumatic aortic (valve) insufficiency: Secondary | ICD-10-CM | POA: Insufficient documentation

## 2022-03-14 LAB — ECHOCARDIOGRAM COMPLETE
AR max vel: 2.49 cm2
AV Area VTI: 2.6 cm2
AV Area mean vel: 2.39 cm2
AV Mean grad: 4 mmHg
AV Peak grad: 6.9 mmHg
AV Vena cont: 0.49 cm
Ao pk vel: 1.31 m/s
Area-P 1/2: 2.34 cm2
Calc EF: 65.2 %
MV M vel: 5.53 m/s
MV Peak grad: 122.3 mmHg
P 1/2 time: 566 msec
S' Lateral: 3.49 cm
Single Plane A2C EF: 70.7 %
Single Plane A4C EF: 53.9 %

## 2022-03-23 ENCOUNTER — Encounter: Payer: Self-pay | Admitting: Dermatology

## 2022-03-24 ENCOUNTER — Encounter: Payer: Self-pay | Admitting: Dermatology

## 2022-03-24 NOTE — Progress Notes (Signed)
   Follow-Up Visit   Subjective  Henry [name] is a 66 y.o. male who presents for the following: Annual Exam (Patient here today for skin check, per patient he has a lesion on his lower back x unsure no bleeding, no pain. Check lesions on scalp x months bleeding with scratching. Personal history of non mole skin cancer. Family history of non mole skin cancer. ).  General skin examination, several areas of concern Location:  Duration:  Quality:  Associated Signs/Symptoms: Modifying Factors:  Severity:  Timing: Context:   Objective  Well appearing patient in no apparent distress; mood and affect are within normal limits. Waist Up General skin examination: No atypical pigmented lesions (all checked with dermoscopy), 1 possible new nonmelanoma skin cancer frontal scalp.  Right Upper Back Noninflamed 1.4 cm deep dermal nodule  Mid Frontal Scalp Ill defined 1.2 cm waxy pink crust         A full examination was performed including scalp, head, eyes, ears, nose, lips, neck, chest, axillae, abdomen, back, buttocks, bilateral upper extremities, bilateral lower extremities, hands, feet, fingers, toes, fingernails, and toenails. All findings within normal limits unless otherwise noted below.  Areas beneath undergarments not fully examined.   Assessment & Plan    Skin exam for malignant neoplasm Waist Up  Yearly skin check.  Biopsy plus treat scalp lesion.  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 Mid Frontal Scalp  Skin / nail biopsy Type of biopsy: tangential   Informed consent: discussed and consent obtained   Timeout: patient name, date of birth, surgical site, and procedure verified   Anesthesia: the lesion was anesthetized in a standard fashion   Anesthetic:  1% lidocaine w/ epinephrine 1-100,000 local infiltration Instrument used: flexible razor blade   Hemostasis achieved with: ferric  subsulfate and electrodesiccation   Outcome: patient tolerated procedure well   Post-procedure details: sterile dressing applied and wound care instructions given   Dressing type: petrolatum    Destruction of lesion Complexity: simple   Destruction method: electrodesiccation and curettage   Informed consent: discussed and consent obtained   Timeout:  patient name, date of birth, surgical site, and procedure verified Anesthesia: the lesion was anesthetized in a standard fashion   Anesthetic:  1% lidocaine w/ epinephrine 1-100,000 local infiltration Curettage performed in three different directions: Yes   Curettage cycles:  3 Lesion length (cm):  1.9 Lesion width (cm):  1.9 Margin per side (cm):  0 Final wound size (cm):  1.9 Hemostasis achieved with:  ferric subsulfate and electrodesiccation Outcome: patient tolerated procedure well with no complications   Post-procedure details: sterile dressing applied and wound care instructions given   Dressing type: petrolatum    Specimen 1 - Surgical pathology Differential Diagnosis: R/O BCC vs SCC - treated after biopsy  Check Margins: No  After shave biopsy the base of the lesion was treated with curettage plus cautery      I, Lavonna Monarch, MD, have reviewed all documentation for this visit.  The documentation on 03/24/22 for the exam, diagnosis, procedures, and orders are all accurate and complete.

## 2022-03-26 ENCOUNTER — Other Ambulatory Visit: Payer: Self-pay | Admitting: Internal Medicine

## 2022-04-03 NOTE — Progress Notes (Unsigned)
Cardiology Office Note:    Date:  04/05/2022   ID:  Henry [name], DOB January 02, 1956, MRN [id number]  PCP:  Janith Lima, MD   [facility] Providers Cardiologist:  None   Referring MD: Janith Lima, MD     History of Present Illness:    Henry [name] is a 66 y.o. male with a hx of HTN, myasthenia gravis, coronary calcification and basal cell carcinoma who was referred by Dr. Ronnald Ramp for further evaluation of AI.  Patient was seen by Dr. Tamala Julian in 12/2020. Note reviewed. Patient had been diagnosed with myasthenia gravis. He underwent CT chest to assess for thymoma and he was found to have aortic valve and coronary calcifications. He was asymptomatic at that time and active.  He saw Dr. Ronnald Ramp in 01/2022. Note reviewed. He was having episodes of DOE. He underwent myoview which showed mild perfusion defect in apical inferior and apex location likely related to artifact. EF was 48% but visually appeared normal. Follow-up TTE 03/2022 showed normal BiV function, G1DD, mild-to-mod AR, mild MR, aortic root 4m, ascending aorta 371m  Today, the patient overall feels okay. He continues to have shortness of breath with exertion. He finds that it has been worse with allergies and heat. Ambulation has been more limited more due to back pain but he is able to ride his bike without issues. No chest pain with exertion. No palpitations, orthopnea or PND. No LE edema. Has occasional vertigo and some lightheadedness but no syncope.   Sister: Afib; Mother: SCD in the setting of hypokalemia  Past Medical History:  Diagnosis Date   Allergy    Anemia    Arthritis    Basal cell carcinoma 02/22/2021   nod-left anterior mandible (CX35FU + exc)   Depression    due to long tern hepatitis C - once the Hep C was cured, had not any issues until last year (22019/03/23when is wife died   History of hepatitis C    has been treated in 2003/23/05 Hypertension    Pneumonia    as a teenager   Scoliosis      Past Surgical History:  Procedure Laterality Date   ANTERIOR CERVICAL DECOMP/DISCECTOMY FUSION N/A 04/13/2019   Procedure: ANTERIOR CERVICAL DECOMPRESSION/DISCECTOMY FUSION CERVICAL FOUR-FIVE, CERVICAL FIVE-SIX, CERVICAL SIX-SEVEN;  Surgeon: CrKary KosMD;  [place] [facility] Service: Neurosurgery;  Laterality: N/A;   COLONOSCOPY     RADIOLOGY WITH ANESTHESIA N/A 03/31/2019   Procedure: MRI WITH ANESTHESIA LUMBAR WITHOUT CONTRAST AND CERVICAL WITHOUT CONTRAST;  Surgeon: Radiologist, Medication, MD;  [place] [facility] Service: Radiology;  Laterality: N/A;   TONSILLECTOMY AND ADENOIDECTOMY      Current Medications: Current Meds  Medication Sig   alendronate (FOSAMAX) 70 MG tablet TAKE 1 TABLET (70 MG TOTAL) BY MOUTH EVERY 7 DAYS WITH FULL GLASS WATER ON EMPTY STOMACH   allopurinol (ZYLOPRIM) 100 MG tablet Take 1 tablet (100 mg total) by mouth daily.   indapamide (LOZOL) 1.25 MG tablet Take 1 tablet (1.25 mg total) by mouth daily.   methocarbamol (ROBAXIN) 500 MG tablet Take 1 tablet (500 mg total) by mouth every 6 (six) hours as needed for muscle spasms.   olmesartan (BENICAR) 20 MG tablet Take 1 tablet (20 mg total) by mouth daily.   pyridostigmine (MESTINON) 60 MG tablet Take 1 tablet (60 mg total) by mouth 3 (three) times daily.   rosuvastatin (CRESTOR) 5 MG tablet Take 1 tablet (5 mg total) by mouth  daily.   traMADol (ULTRAM) 50 MG tablet Take 1 tablet (50 mg total) by mouth every 6 (six) hours as needed.     Allergies:   Valacyclovir   Social History   Socioeconomic History   Marital status: Widowed    Spouse name: Not on file   Number of children: Not on file   Years of education: Not on file   Highest education level: Not on file  Occupational History   Not on file  Tobacco Use   Smoking status: Former    Years: 24.00    Types: Cigarettes   Smokeless tobacco: Never   Tobacco comments:    light smoker when I smoked  Vaping Use   Vaping Use: Never used  Substance and  Sexual Activity   Alcohol use: Yes    Alcohol/week: 35.0 standard drinks of alcohol    Types: 21 Cans of beer, 14 Standard drinks or equivalent per week    Comment: 2-3 drinks per day   Drug use: Yes    Types: Marijuana    Comment: every once in a while per pt   Sexual activity: Not Currently  Other Topics Concern   Not on file  Social History Narrative   Right handed   1 cup coffee every morning, ice tea w/ lunch, no soda   Social Determinants of Health   Financial Resource Strain: Not on file  Food Insecurity: Not on file  Transportation Needs: Not on file  Physical Activity: Not on file  Stress: Not on file  Social Connections: Not on file     Family History: The patient's family history includes Alcoholism in his brother; Hypertension in his father and mother.  ROS:   Please see the history of present illness.     All other systems reviewed and are negative.  EKGs/Labs/Other Studies Reviewed:    The following studies were reviewed today: Myoview 02/2022:   Findings are consistent with no prior ischemia. The study is low risk.   No ST deviation was noted.   There is a small defect with mild reduction in uptake present in the apical inferior and apex location(s) that is fixed. There is normal wall motion in the defect area. Consistent with artifact caused by subdiaphragmatic activity.   Left ventricular function is abnormal. Nuclear EF 49%, however, visually appears normal. Recommend TTE for further evaluation. End diastolic cavity size is normal.   Prior study not available for comparison.   TTE 03/2022: IMPRESSIONS   1. Left ventricular ejection fraction, by estimation, is 60 to 65%. The  left ventricle has normal function. The left ventricle has no regional  wall motion abnormalities. There is mild asymmetric left ventricular  hypertrophy of the basal-septal segment.  Left ventricular diastolic parameters are consistent with Grade I  diastolic dysfunction  (impaired relaxation).   2. Right ventricular systolic function is normal. The right ventricular  size is normal. There is normal pulmonary artery systolic pressure. The  estimated right ventricular systolic pressure is 38.1 mmHg.   3. Left atrial size was moderately dilated.   4. The mitral valve is grossly normal. Mild mitral valve regurgitation.   5. The aortic valve is tricuspid. There is mild calcification of the  aortic valve. There is mild thickening of the aortic valve. Aortic valve  regurgitation is mild to moderate. Aortic valve sclerosis/calcification is  present, without any evidence of  aortic stenosis.   6. Aortic dilatation noted. There is mild dilatation of the aortic root,  measuring 41 mm. There is borderline dilatation of the ascending aorta,  measuring 39 mm. There is mild dilatation of the aortic arch, measuring 41  mm.   7. The inferior vena cava is normal in size with greater than 50%  respiratory variability, suggesting right atrial pressure of 3 mmHg.   Comparison(s): No prior Echocardiogram.   EKG:  EKG is  ordered today.  The ekg ordered today demonstrates NSR, HR 62  Recent Labs: 01/25/2022: ALT 14; BUN 22; Creatinine, Ser 1.03; Hemoglobin 14.1; Platelets 265.0; Potassium 4.3; Pro B Natriuretic peptide (BNP) 50.0; Sodium 139; TSH 0.62  Recent Lipid Panel    Component Value Date/Time   CHOL 137 01/25/2022 1539   TRIG 166.0 (H) 01/25/2022 1539   HDL 63.80 01/25/2022 1539   CHOLHDL 2 01/25/2022 1539   VLDL 33.2 01/25/2022 1539   LDLCALC 40 01/25/2022 1539     Risk Assessment/Calculations:           Physical Exam:    VS:  BP 128/88   Pulse 62   Ht '5\' 10"'$  (1.778 m)   Wt 162 lb 12.8 oz (73.8 kg)   SpO2 97%   BMI 23.36 kg/m     Wt Readings from Last 3 Encounters:  04/05/22 162 lb 12.8 oz (73.8 kg)  02/15/22 167 lb (75.8 kg)  01/25/22 167 lb (75.8 kg)     GEN:  Well nourished, well developed in no acute distress HEENT: Normal NECK: No JVD;  No carotid bruits CARDIAC: RRR, no murmurs, rubs, gallops RESPIRATORY:  Clear to auscultation without rales, wheezing or rhonchi  ABDOMEN: Soft, non-tender, non-distended MUSCULOSKELETAL:  No edema; No deformity  SKIN: Warm and dry NEUROLOGIC:  Alert and oriented x 3 PSYCHIATRIC:  Normal affect   ASSESSMENT:    1. Nonrheumatic aortic valve insufficiency   2. Essential hypertension   3. Hyperlipidemia LDL goal <130   4. Coronary artery disease involving native coronary artery of native heart without angina pectoris   5. DOE (dyspnea on exertion)   6. Aortic root dilation (HCC)    PLAN:    In order of problems listed above:  #Coronary Calcification: Noted on CT chest that was obtained for work-up for possible thymoma given recent diagnosis of mysthenia gravis. Myoview 02/2022 with no ischemia and likely inferior artifact. TTE with normal EF 60-65%, no WMA. Currently, with some dyspnea on exertion but this is unchanged.  #HTN: Well controlled and at goal <130/90 -Continue olmesartan '20mg'$  daily -Continue lozol 1.'25mg'$  daily  #HLD: -Continue crestor '5mg'$  daily -Goal LDL<70; currently LDL 40  #Mild-to-moderate AR: -Continue serial monitoring with repeat TTEs  #Aortic Root and Ascending Aorta Dilation: Aortic root 28m, ascending aorta 367m-Monitor with yearly echoes   #Myasthenia Gravis: -Well controlled currently with no significant symptoms currently       Medication Adjustments/Labs and Tests Ordered: Current medicines are reviewed at length with the patient today.  Concerns regarding medicines are outlined above.  Orders Placed This Encounter  Procedures   EKG 12-Lead   ECHOCARDIOGRAM COMPLETE   No orders of the defined types were placed in this encounter.   Patient Instructions  Medication Instructions:  Your physician recommends that you continue on your current medications as directed. Please refer to the Current Medication list given to you today.  *If  you need a refill on your cardiac medications before your next appointment, please call your pharmacy*   Testing/Procedures: Your physician has requested that you have an echocardiogram in July 2024. Echocardiography  is a painless test that uses sound waves to create images of your heart. It provides your doctor with information about the size and shape of your heart and how well your heart's chambers and valves are working. This procedure takes approximately one hour. There are no restrictions for this procedure.   Follow-Up: At [facility], you and your health needs are our priority.  As part of our continuing mission to provide you with exceptional heart care, we have created designated Provider Care Teams.  These Care Teams include your primary Cardiologist (physician) and Advanced Practice Providers (APPs -  Physician Assistants and Nurse Practitioners) who all work together to provide you with the care you need, when you need it.  Your next appointment:   1 year(s)  The format for your next appointment:   In Person  Provider:   Gwyndolyn Kaufman, MD   Important Information About Sugar         Signed, Freada Bergeron, MD  04/05/2022 9:52 AM    [place]

## 2022-04-05 ENCOUNTER — Ambulatory Visit (INDEPENDENT_AMBULATORY_CARE_PROVIDER_SITE_OTHER): Payer: Medicare Other | Admitting: Cardiology

## 2022-04-05 ENCOUNTER — Encounter: Payer: Self-pay | Admitting: Cardiology

## 2022-04-05 VITALS — BP 128/88 | HR 62 | Ht 70.0 in | Wt 162.8 lb

## 2022-04-05 DIAGNOSIS — I351 Nonrheumatic aortic (valve) insufficiency: Secondary | ICD-10-CM

## 2022-04-05 DIAGNOSIS — I77810 Thoracic aortic ectasia: Secondary | ICD-10-CM

## 2022-04-05 DIAGNOSIS — E785 Hyperlipidemia, unspecified: Secondary | ICD-10-CM

## 2022-04-05 DIAGNOSIS — I10 Essential (primary) hypertension: Secondary | ICD-10-CM

## 2022-04-05 DIAGNOSIS — I2510 Atherosclerotic heart disease of native coronary artery without angina pectoris: Secondary | ICD-10-CM | POA: Diagnosis not present

## 2022-04-05 DIAGNOSIS — R0609 Other forms of dyspnea: Secondary | ICD-10-CM

## 2022-04-05 NOTE — Patient Instructions (Signed)
Medication Instructions:  Your physician recommends that you continue on your current medications as directed. Please refer to the Current Medication list given to you today.  *If you need a refill on your cardiac medications before your next appointment, please call your pharmacy*   Testing/Procedures: Your physician has requested that you have an echocardiogram in July 2024. Echocardiography is a painless test that uses sound waves to create images of your heart. It provides your doctor with information about the size and shape of your heart and how well your heart's chambers and valves are working. This procedure takes approximately one hour. There are no restrictions for this procedure.   Follow-Up: At [facility], you and your health needs are our priority.  As part of our continuing mission to provide you with exceptional heart care, we have created designated Provider Care Teams.  These Care Teams include your primary Cardiologist (physician) and Advanced Practice Providers (APPs -  Physician Assistants and Nurse Practitioners) who all work together to provide you with the care you need, when you need it.  Your next appointment:   1 year(s)  The format for your next appointment:   In Person  Provider:   Gwyndolyn Kaufman, MD   Important Information About Sugar

## 2022-04-26 ENCOUNTER — Ambulatory Visit (INDEPENDENT_AMBULATORY_CARE_PROVIDER_SITE_OTHER): Payer: Medicare Other | Admitting: Internal Medicine

## 2022-04-26 ENCOUNTER — Ambulatory Visit (INDEPENDENT_AMBULATORY_CARE_PROVIDER_SITE_OTHER): Payer: Medicare Other

## 2022-04-26 ENCOUNTER — Encounter: Payer: Self-pay | Admitting: Internal Medicine

## 2022-04-26 VITALS — BP 134/86 | HR 78 | Temp 98.5°F | Ht 70.0 in | Wt 165.0 lb

## 2022-04-26 DIAGNOSIS — M67911 Unspecified disorder of synovium and tendon, right shoulder: Secondary | ICD-10-CM | POA: Diagnosis not present

## 2022-04-26 DIAGNOSIS — M25511 Pain in right shoulder: Secondary | ICD-10-CM | POA: Insufficient documentation

## 2022-04-26 DIAGNOSIS — I2510 Atherosclerotic heart disease of native coronary artery without angina pectoris: Secondary | ICD-10-CM

## 2022-04-26 NOTE — Patient Instructions (Signed)

## 2022-04-26 NOTE — Progress Notes (Signed)
Subjective:  Patient ID: Henry Henry [name], male    DOB: 1955-11-06  Age: 66 y.o. MRN: [id number]  CC: Shoulder Pain   HPI Henry Henry [name] presents for f/up -  He complains of a several month history of right shoulder pain with no history of trauma or injury.  He is controlling the pain with Tylenol and tramadol.  Outpatient Medications Prior to Visit  Medication Sig Dispense Refill   alendronate (FOSAMAX) 70 MG tablet TAKE 1 TABLET (70 MG TOTAL) BY MOUTH EVERY 7 DAYS WITH FULL GLASS WATER ON EMPTY STOMACH 4 tablet 2   allopurinol (ZYLOPRIM) 100 MG tablet Take 1 tablet (100 mg total) by mouth daily. 90 tablet 1   indapamide (LOZOL) 1.25 MG tablet Take 1 tablet (1.25 mg total) by mouth daily. 90 tablet 0   methocarbamol (ROBAXIN) 500 MG tablet Take 1 tablet (500 mg total) by mouth every 6 (six) hours as needed for muscle spasms. 40 tablet 0   olmesartan (BENICAR) 20 MG tablet Take 1 tablet (20 mg total) by mouth daily. 90 tablet 0   pyridostigmine (MESTINON) 60 MG tablet Take 1 tablet (60 mg total) by mouth 3 (three) times daily. 270 tablet 1   rosuvastatin (CRESTOR) 5 MG tablet Take 1 tablet (5 mg total) by mouth daily. 90 tablet 1   traMADol (ULTRAM) 50 MG tablet Take 1 tablet (50 mg total) by mouth every 6 (six) hours as needed. 360 tablet 0   No facility-administered medications prior to visit.    ROS Review of Systems  Constitutional:  Negative for chills and fatigue.  HENT: Negative.    Eyes: Negative.   Respiratory:  Negative for cough and chest tightness.   Cardiovascular:  Negative for chest pain, palpitations and leg swelling.  Gastrointestinal:  Negative for abdominal pain, constipation, diarrhea and nausea.  Endocrine: Negative.   Genitourinary: Negative.  Negative for difficulty urinating.  Musculoskeletal:  Positive for arthralgias. Negative for joint swelling and myalgias.  Neurological: Negative.  Negative for dizziness and weakness.  Hematological:  Negative for  adenopathy. Does not bruise/bleed easily.  Psychiatric/Behavioral: Negative.      Objective:  BP 134/86 (BP Location: Left Arm, Patient Position: Sitting, Cuff Size: Large)   Pulse 78   Temp 98.5 F (36.9 C) (Oral)   Ht '5\' 10"'$  (1.778 m)   Wt 165 lb (74.8 kg)   SpO2 96%   BMI 23.68 kg/m   BP Readings from Last 3 Encounters:  04/26/22 134/86  04/05/22 128/88  01/25/22 (!) 166/90    Wt Readings from Last 3 Encounters:  04/26/22 165 lb (74.8 kg)  04/05/22 162 lb 12.8 oz (73.8 kg)  02/15/22 167 lb (75.8 kg)    Physical Exam Vitals reviewed.  HENT:     Mouth/Throat:     Mouth: Mucous membranes are Henry [name].  Eyes:     General: No scleral icterus.    Conjunctiva/sclera: Conjunctivae normal.  Cardiovascular:     Rate and Rhythm: Normal rate and regular rhythm.     Heart sounds: No murmur heard. Pulmonary:     Effort: Pulmonary effort is normal.     Breath sounds: No stridor. No wheezing, rhonchi or rales.  Abdominal:     General: Abdomen is flat.     Palpations: There is no mass.     Tenderness: There is no abdominal tenderness. There is no guarding.     Hernia: No hernia is present.  Musculoskeletal:        General:  Normal range of motion.     Right shoulder: No swelling, deformity, effusion, tenderness, bony tenderness or crepitus. Normal range of motion.     Left shoulder: Normal.     Cervical back: Neck supple.     Right lower leg: No edema.     Comments: There is some discomfort elicited with internal and external rotation.  Lymphadenopathy:     Cervical: No cervical adenopathy.  Skin:    General: Skin is warm.  Neurological:     General: No focal deficit present.     Mental Status: He is alert.  Psychiatric:        Mood and Affect: Mood normal.        Behavior: Behavior normal.     Lab Results  Component Value Date   WBC 7.4 01/25/2022   HGB 14.1 01/25/2022   HCT 42.6 01/25/2022   PLT 265.0 01/25/2022   GLUCOSE 85 01/25/2022   CHOL 137 01/25/2022    TRIG 166.0 (H) 01/25/2022   HDL 63.80 01/25/2022   LDLCALC 40 01/25/2022   ALT 14 01/25/2022   AST 21 01/25/2022   NA 139 01/25/2022   K 4.3 01/25/2022   CL 104 01/25/2022   CREATININE 1.03 01/25/2022   BUN 22 01/25/2022   CO2 29 01/25/2022   TSH 0.62 01/25/2022   PSA 1.04 01/25/2022   HGBA1C 5.0 04/13/2019    MYOCARDIAL PERFUSION IMAGING  Result Date: 02/15/2022   Findings are consistent with no prior ischemia. The study is low risk.   No ST deviation was noted.   There is a small defect with mild reduction in uptake present in the apical inferior and apex location(s) that is fixed. There is normal wall motion in the defect area. Consistent with artifact caused by subdiaphragmatic activity.   Left ventricular function is abnormal. Nuclear EF 49%, however, visually appears normal. Recommend TTE for further evaluation. End diastolic cavity size is normal.   Prior study not available for comparison.   DG Shoulder Right  Result Date: 04/27/2022 CLINICAL DATA:  Right shoulder pain x1 month EXAM: RIGHT SHOULDER - 2+ VIEW COMPARISON:  None Available. FINDINGS: There is no evidence of fracture or dislocation. There is no evidence of arthropathy or other focal bone abnormality. Soft tissues are unremarkable. IMPRESSION: No fracture or dislocation is seen. No abnormal soft tissue calcifications are seen. Electronically Signed   By: Elmer Picker M.D.   On: 04/27/2022 15:20     Assessment & Plan:   Henry [name] was seen today for shoulder pain.  Diagnoses and all orders for this visit:  Acute pain of right shoulder- Plain film is normal.  I am concerned that he has a rotator cuff issue so I have asked him to see orthopedics. -     DG Shoulder Right; Future  Rotator cuff disorder, right -     Ambulatory referral to Orthopedic Surgery   I am having Henry Henry [name]. Henry Henry [name] maintain his pyridostigmine, methocarbamol, rosuvastatin, allopurinol, traMADol, olmesartan, indapamide, and alendronate.  No  orders of the defined types were placed in this encounter.    Follow-up: Return in about 6 months (around 10/27/2022).  Henry Calico, MD

## 2022-04-27 ENCOUNTER — Ambulatory Visit (INDEPENDENT_AMBULATORY_CARE_PROVIDER_SITE_OTHER): Payer: Medicare Other

## 2022-04-27 DIAGNOSIS — Z0000 Encounter for general adult medical examination without abnormal findings: Secondary | ICD-10-CM

## 2022-04-27 NOTE — Progress Notes (Signed)
Subjective:   Henry [name] is a 66 y.o. male who presents for an Initial Medicare Annual Wellness Visit.   I connected with Henry [name]  today by telephone and verified that I am speaking with the correct person using two identifiers. Location patient: home Location provider: work Persons participating in the virtual visit: patient, provider.   I discussed the limitations, risks, security and privacy concerns of performing an evaluation and management service by telephone and the availability of in person appointments. I also discussed with the patient that there may be a patient responsible charge related to this service. The patient expressed understanding and verbally consented to this telephonic visit.    Interactive audio and video telecommunications were attempted between this provider and patient, however failed, due to patient having technical difficulties OR patient did not have access to video capability.  We continued and completed visit with audio only.    Review of Systems     Cardiac Risk Factors include: advanced age (>60mn, >>30women);male gender     Objective:    Today's Vitals   There is no height or weight on file to calculate BMI.     04/13/2019    9:44 AM 03/31/2019    6:58 AM  Advanced Directives  Does Patient Have a Medical Advance Directive? No No  Would patient like information on creating a medical advance directive? No - Patient declined No - Patient declined    Current Medications (verified) Outpatient Encounter Medications as of 04/27/2022  Medication Sig   alendronate (FOSAMAX) 70 MG tablet TAKE 1 TABLET (70 MG TOTAL) BY MOUTH EVERY 7 DAYS WITH FULL GLASS WATER ON EMPTY STOMACH   allopurinol (ZYLOPRIM) 100 MG tablet Take 1 tablet (100 mg total) by mouth daily.   indapamide (LOZOL) 1.25 MG tablet Take 1 tablet (1.25 mg total) by mouth daily.   methocarbamol (ROBAXIN) 500 MG tablet Take 1 tablet (500 mg total) by mouth every 6 (six) hours as  needed for muscle spasms.   olmesartan (BENICAR) 20 MG tablet Take 1 tablet (20 mg total) by mouth daily.   pyridostigmine (MESTINON) 60 MG tablet Take 1 tablet (60 mg total) by mouth 3 (three) times daily.   rosuvastatin (CRESTOR) 5 MG tablet Take 1 tablet (5 mg total) by mouth daily.   traMADol (ULTRAM) 50 MG tablet Take 1 tablet (50 mg total) by mouth every 6 (six) hours as needed.   No facility-administered encounter medications on file as of 04/27/2022.    Allergies (verified) Valacyclovir   History: Past Medical History:  Diagnosis Date   Allergy    Anemia    Arthritis    Basal cell carcinoma 02/22/2021   nod-left anterior mandible (CX35FU + exc)   Depression    due to long tern hepatitis C - once the Hep C was cured, had not any issues until last year (11/24/17 when is wife died   History of hepatitis C    has been treated in [id number]  Hypertension    Pneumonia    as a teenager   Scoliosis    Past Surgical History:  Procedure Laterality Date   ANTERIOR CERVICAL DECOMP/DISCECTOMY FUSION N/A 04/13/2019   Procedure: ANTERIOR CERVICAL DECOMPRESSION/DISCECTOMY FUSION CERVICAL FOUR-FIVE, CERVICAL FIVE-SIX, CERVICAL SIX-SEVEN;  Surgeon: CKary Kos MD;  [facility]  Service: Neurosurgery;  Laterality: N/A;   COLONOSCOPY     RADIOLOGY WITH ANESTHESIA N/A 03/31/2019   Procedure: MRI WITH ANESTHESIA LUMBAR WITHOUT CONTRAST AND CERVICAL WITHOUT CONTRAST;  Surgeon: Radiologist, Medication, MD;  Location: [facility];  Service: Radiology;  Laterality: N/A;   TONSILLECTOMY AND ADENOIDECTOMY     Family History  Problem Relation Age of Onset   Hypertension Mother    Hypertension Father    Alcoholism Brother    Social History   Socioeconomic History   Marital status: Widowed    Spouse name: Not on file   Number of children: Not on file   Years of education: Not on file   Highest education level: Not on file  Occupational History   Not on file  Tobacco Use   Smoking status: Former     Years: 24.00    Types: Cigarettes   Smokeless tobacco: Never   Tobacco comments:    light smoker when I smoked  Vaping Use   Vaping Use: Never used  Substance and Sexual Activity   Alcohol use: Yes    Alcohol/week: 35.0 standard drinks of alcohol    Types: 21 Cans of beer, 14 Standard drinks or equivalent per week    Comment: 2-3 drinks per day   Drug use: Yes    Types: Marijuana    Comment: every once in a while per pt   Sexual activity: Not Currently  Other Topics Concern   Not on file  Social History Narrative   Right handed   1 cup coffee every morning, ice tea w/ lunch, no soda   Social Determinants of Health   Financial Resource Strain: Low Risk  (04/27/2022)   Overall Financial Resource Strain (CARDIA)    Difficulty of Paying Living Expenses: Not hard at all  Food Insecurity: No Food Insecurity (04/27/2022)   Hunger Vital Sign    Worried About Running Out of Food in the Last Year: Never true    Ran Out of Food in the Last Year: Never true  Transportation Needs: No Transportation Needs (04/27/2022)   PRAPARE - Hydrologist (Medical): No    Lack of Transportation (Non-Medical): No  Physical Activity: Sufficiently Active (04/27/2022)   Exercise Vital Sign    Days of Exercise per Week: 5 days    Minutes of Exercise per Session: 60 min  Stress: No Stress Concern Present (04/27/2022)   Page    Feeling of Stress : Only a little  Social Connections: Moderately Integrated (04/27/2022)   Social Connection and Isolation Panel [NHANES]    Frequency of Communication with Friends and Family: Three times a week    Frequency of Social Gatherings with Friends and Family: Three times a week    Attends Religious Services: 1 to 4 times per year    Active Member of Clubs or Organizations: Yes    Attends Archivist Meetings: 1 to 4 times per year    Marital Status: Widowed     Tobacco Counseling Counseling given: Not Answered Tobacco comments: light smoker when I smoked   Clinical Intake:  Pre-visit preparation completed: Yes  Pain : No/denies pain     Nutritional Risks: None Diabetes: No  How often do you need to have someone help you when you read instructions, pamphlets, or other written materials from your doctor or pharmacy?: 1 - Never What is the last grade level you completed in school?: GED  Diabetic?no   Interpreter Needed?: No  Information entered by :: L.Wilson,LPN   Activities of Daily Living    04/27/2022    8:48 AM  In your present  state of health, do you have any difficulty performing the following activities:  Hearing? 0  Vision? 0  Difficulty concentrating or making decisions? 0  Walking or climbing stairs? 0  Dressing or bathing? 0  Doing errands, shopping? 0  Preparing Food and eating ? N  Using the Toilet? N  In the past six months, have you accidently leaked urine? N  Do you have problems with loss of bowel control? N  Managing your Medications? N  Managing your Finances? N  Housekeeping or managing your Housekeeping? N    Patient Care Team: Janith Lima, MD as PCP - General (Internal Medicine) Freada Bergeron, MD as PCP - Cardiology (Cardiology) Lavonna Monarch, MD as Consulting Physician (Dermatology)  Indicate any recent Medical Services you may have received from other than [facility] providers in the past year (date may be approximate).     Assessment:   This is a routine wellness examination for Henry [name].  Hearing/Vision screen Vision Screening - Comments:: Annual eye exams wear glasses   Dietary issues and exercise activities discussed: Current Exercise Habits: Home exercise routine, Type of exercise: walking, Time (Minutes): 60, Frequency (Times/Week): 5, Weekly Exercise (Minutes/Week): 300, Intensity: Mild, Exercise limited by: None identified   Goals Addressed   None    Depression  Screen    04/27/2022    8:48 AM 04/27/2022    8:44 AM 04/27/2022    8:43 AM 06/08/2019    4:05 PM 11/17/2018   11:46 AM 05/02/2018    9:28 AM  PHQ 2/9 Scores  PHQ - 2 Score 0 0 '1 2 2 1  '$ PHQ- 9 Score    6 4     Fall Risk    04/27/2022    8:49 AM 05/09/2021    2:30 PM 01/25/2020    3:42 PM 06/08/2019    4:05 PM 11/18/2018    8:00 AM  Fall Risk   Falls in the past year? 0 0 0 0 0  Number falls in past yr: 0 0 0 0   Injury with Fall? 0 0 0 0   Risk for fall due to :   Other (Comment)    Risk for fall due to: Comment   DIZZINESS    Follow up Falls evaluation completed;Education provided  Falls evaluation completed Falls evaluation completed     [place]:  Any stairs in or around the home? Yes  If so, are there any without handrails? No  Home free of loose throw rugs in walkways, pet beds, electrical cords, etc? Yes  Adequate lighting in your home to reduce risk of falls? Yes   ASSISTIVE DEVICES UTILIZED TO PREVENT FALLS:  Life alert? Yes  Use of a cane, walker or w/c? No  Grab bars in the bathroom? No  Shower chair or bench in shower? No  Elevated toilet seat or a handicapped toilet? No    Cognitive Function:  Normal cognitive status assessed by telephone conversation  by this Nurse Health Advisor. No abnormalities found.        04/27/2022    8:48 AM  6CIT Screen  What Year? 0 points  What month? 0 points  What time? 0 points  Count back from 20 0 points  Months in reverse 0 points  Repeat phrase 0 points  Total Score 0 points    Immunizations Immunization History  Administered Date(s) Administered   Fluad Quad(high Dose 65+) 06/08/2019, 05/09/2021   Influenza, High Dose Seasonal PF  11/17/2018   PFIZER(Purple Top)SARS-COV-2 Vaccination 09/26/2019, 10/17/2019, 11/09/2019, 12/10/2019, 09/10/2020   Pneumococcal Conjugate-13 11/17/2018   Pneumococcal Polysaccharide-23 01/25/2020   Tdap 05/21/2018    TDAP status: Up to date  Flu  Vaccine status: Up to date  Pneumococcal vaccine status: Up to date  Covid-19 vaccine status: Completed vaccines  Qualifies for Shingles Vaccine? Yes   Zostavax completed No   Shingrix Completed?: No.    Education has been provided regarding the importance of this vaccine. Patient has been advised to call insurance company to determine out of pocket expense if they have not yet received this vaccine. Advised may also receive vaccine at local pharmacy or Health Dept. Verbalized acceptance and understanding.  Screening Tests Health Maintenance  Topic Date Due   Zoster Vaccines- Shingrix (1 of 2) Never done   INFLUENZA VACCINE  12/09/2022 (Originally 04/10/2022)   COLONOSCOPY (Pts 45-27yr Insurance coverage will need to be confirmed)  11/22/2027   TETANUS/TDAP  05/21/2028   Pneumonia Vaccine 66 Years old  Completed   Hepatitis C Screening  Completed   HPV VACCINES  Aged Out   COVID-19 Vaccine  Discontinued    Health Maintenance  Health Maintenance Due  Topic Date Due   Zoster Vaccines- Shingrix (1 of 2) Never done    Colorectal cancer screening: Type of screening: Colonoscopy. Completed 11/21/2017. Repeat every 10 years  Lung Cancer Screening: (Low Dose CT Chest recommended if Age 68-80years, 30 pack-year currently smoking OR have quit w/in 15years.) does not qualify.   Lung Cancer Screening Referral: n/a  Additional Screening: Hepatitis C Screening: does not qualify;   Vision Screening: Recommended annual ophthalmology exams for early detection of glaucoma and other disorders of the eye. Is the patient up to date with their annual eye exam?  Yes  Who is the provider or what is the name of the office in which the patient attends annual eye exams? Dr.Groat  If pt is not established with a provider, would they like to be referred to a provider to establish care? No .   Dental Screening: Recommended annual dental exams for proper oral hygiene  Community Resource Referral /  Chronic Care Management: CRR required this visit?  No   CCM required this visit?  No      Plan:     I have personally reviewed and noted the following in the patient's chart:   Medical and social history Use of alcohol, tobacco or illicit drugs  Current medications and supplements including opioid prescriptions. Patient is not currently taking opioid prescriptions. Functional ability and status Nutritional status Physical activity Advanced directives List of other physicians Hospitalizations, surgeries, and ER visits in previous 12 months Vitals Screenings to include cognitive, depression, and falls Referrals and appointments  In addition, I have reviewed and discussed with patient certain preventive protocols, quality metrics, and best practice recommendations. A written personalized care plan for preventive services as well as general preventive health recommendations were provided to patient.     LDaphane Shepherd LPN   [id number]  Nurse Notes: none

## 2022-04-27 NOTE — Patient Instructions (Signed)
[name] , Thank you for taking time to come for your Medicare Wellness Visit. I appreciate your ongoing commitment to your health goals. Please review the following plan we discussed and let me know if I can assist you in the future.   Screening recommendations/referrals: Colonoscopy:11/21/2017 Recommended yearly ophthalmology/optometry visit for glaucoma screening and checkup Recommended yearly dental visit for hygiene and checkup  Vaccinations: Influenza vaccine: completed  Pneumococcal vaccine: completed  Tdap vaccine: 11/21/2017 Shingles vaccine: will consider     Advanced directives: yes   Conditions/risks identified: none   Next appointment: none   Preventive Care 36 Years and Older, Male Preventive care refers to lifestyle choices and visits with your health care provider that can promote health and wellness. What does preventive care include? A yearly physical exam. This is also called an annual well check. Dental exams once or twice a year. Routine eye exams. Ask your health care provider how often you should have your eyes checked. Personal lifestyle choices, including: Daily care of your teeth and gums. Regular physical activity. Eating a healthy diet. Avoiding tobacco and drug use. Limiting alcohol use. Practicing safe sex. Taking low doses of aspirin every day. Taking vitamin and mineral supplements as recommended by your health care provider. What happens during an annual well check? The services and screenings done by your health care provider during your annual well check will depend on your age, overall health, lifestyle risk factors, and family history of disease. Counseling  Your health care provider may ask you questions about your: Alcohol use. Tobacco use. Drug use. Emotional well-being. Home and relationship well-being. Sexual activity. Eating habits. History of falls. Memory and ability to understand (cognition). Work and work  Statistician. Screening  You may have the following tests or measurements: Height, weight, and BMI. Blood pressure. Lipid and cholesterol levels. These may be checked every 5 years, or more frequently if you are over 93 years old. Skin check. Lung cancer screening. You may have this screening every year starting at age 59 if you have a 30-pack-year history of smoking and currently smoke or have quit within the past 15 years. Fecal occult blood test (FOBT) of the stool. You may have this test every year starting at age 58. Flexible sigmoidoscopy or colonoscopy. You may have a sigmoidoscopy every 5 years or a colonoscopy every 10 years starting at age 15. Prostate cancer screening. Recommendations will vary depending on your family history and other risks. Hepatitis C blood test. Hepatitis B blood test. Sexually transmitted disease (STD) testing. Diabetes screening. This is done by checking your blood sugar (glucose) after you have not eaten for a while (fasting). You may have this done every 1-3 years. Abdominal aortic aneurysm (AAA) screening. You may need this if you are a current or former smoker. Osteoporosis. You may be screened starting at age 71 if you are at high risk. Talk with your health care provider about your test results, treatment options, and if necessary, the need for more tests. Vaccines  Your health care provider may recommend certain vaccines, such as: Influenza vaccine. This is recommended every year. Tetanus, diphtheria, and acellular pertussis (Tdap, Td) vaccine. You may need a Td booster every 10 years. Zoster vaccine. You may need this after age 16. Pneumococcal 13-valent conjugate (PCV13) vaccine. One dose is recommended after age 69. Pneumococcal polysaccharide (PPSV23) vaccine. One dose is recommended after age 74. Talk to your health care provider about which screenings and vaccines you need and how often you need  them. This information is not intended to replace  advice given to you by your health care provider. Make sure you discuss any questions you have with your health care provider. Document Released: 09/23/2015 Document Revised: 05/16/2016 Document Reviewed: 06/28/2015 Elsevier Interactive Patient Education  2017 [place] Prevention in the Home Falls can cause injuries. They can happen to people of all ages. There are many things you can do to make your home safe and to help prevent falls. What can I do on the outside of my home? Regularly fix the edges of walkways and driveways and fix any cracks. Remove anything that might make you trip as you walk through a door, such as a raised step or threshold. Trim any bushes or trees on the path to your home. Use bright outdoor lighting. Clear any walking paths of anything that might make someone trip, such as rocks or tools. Regularly check to see if handrails are loose or broken. Make sure that both sides of any steps have handrails. Any raised decks and porches should have guardrails on the edges. Have any leaves, snow, or ice cleared regularly. Use sand or salt on walking paths during winter. Clean up any spills in your garage right away. This includes oil or grease spills. What can I do in the bathroom? Use night lights. Install grab bars by the toilet and in the tub and shower. Do not use towel bars as grab bars. Use non-skid mats or decals in the tub or shower. If you need to sit down in the shower, use a plastic, non-slip stool. Keep the floor dry. Clean up any water that spills on the floor as soon as it happens. Remove soap buildup in the tub or shower regularly. Attach bath mats securely with double-sided non-slip rug tape. Do not have throw rugs and other things on the floor that can make you trip. What can I do in the bedroom? Use night lights. Make sure that you have a light by your bed that is easy to reach. Do not use any sheets or blankets that are too big for your bed.  They should not hang down onto the floor. Have a firm chair that has side arms. You can use this for support while you get dressed. Do not have throw rugs and other things on the floor that can make you trip. What can I do in the kitchen? Clean up any spills right away. Avoid walking on wet floors. Keep items that you use a lot in easy-to-reach places. If you need to reach something above you, use a strong step stool that has a grab bar. Keep electrical cords out of the way. Do not use floor polish or wax that makes floors slippery. If you must use wax, use non-skid floor wax. Do not have throw rugs and other things on the floor that can make you trip. What can I do with my stairs? Do not leave any items on the stairs. Make sure that there are handrails on both sides of the stairs and use them. Fix handrails that are broken or loose. Make sure that handrails are as long as the stairways. Check any carpeting to make sure that it is firmly attached to the stairs. Fix any carpet that is loose or worn. Avoid having throw rugs at the top or bottom of the stairs. If you do have throw rugs, attach them to the floor with carpet tape. Make sure that you have a light switch at  the top of the stairs and the bottom of the stairs. If you do not have them, ask someone to add them for you. What else can I do to help prevent falls? Wear shoes that: Do not have high heels. Have rubber bottoms. Are comfortable and fit you well. Are closed at the toe. Do not wear sandals. If you use a stepladder: Make sure that it is fully opened. Do not climb a closed stepladder. Make sure that both sides of the stepladder are locked into place. Ask someone to hold it for you, if possible. Clearly mark and make sure that you can see: Any grab bars or handrails. First and last steps. Where the edge of each step is. Use tools that help you move around (mobility aids) if they are needed. These  include: Canes. Walkers. Scooters. Crutches. Turn on the lights when you go into a dark area. Replace any light bulbs as soon as they burn out. Set up your furniture so you have a clear path. Avoid moving your furniture around. If any of your floors are uneven, fix them. If there are any pets around you, be aware of where they are. Review your medicines with your doctor. Some medicines can make you feel dizzy. This can increase your chance of falling. Ask your doctor what other things that you can do to help prevent falls. This information is not intended to replace advice given to you by your health care provider. Make sure you discuss any questions you have with your health care provider. Document Released: 06/23/2009 Document Revised: 02/02/2016 Document Reviewed: 10/01/2014 Elsevier Interactive Patient Education  2017 [employer].

## 2022-04-28 DIAGNOSIS — M67911 Unspecified disorder of synovium and tendon, right shoulder: Secondary | ICD-10-CM | POA: Insufficient documentation

## 2022-05-08 ENCOUNTER — Ambulatory Visit (INDEPENDENT_AMBULATORY_CARE_PROVIDER_SITE_OTHER): Payer: Medicare Other | Admitting: Orthopaedic Surgery

## 2022-05-08 DIAGNOSIS — G8929 Other chronic pain: Secondary | ICD-10-CM | POA: Diagnosis not present

## 2022-05-08 DIAGNOSIS — M25511 Pain in right shoulder: Secondary | ICD-10-CM | POA: Diagnosis not present

## 2022-05-08 DIAGNOSIS — M25512 Pain in left shoulder: Secondary | ICD-10-CM | POA: Diagnosis not present

## 2022-05-08 DIAGNOSIS — I2510 Atherosclerotic heart disease of native coronary artery without angina pectoris: Secondary | ICD-10-CM | POA: Diagnosis not present
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
## 2022-06-01 ENCOUNTER — Other Ambulatory Visit: Payer: Self-pay | Admitting: Internal Medicine

## 2022-06-01 DIAGNOSIS — I10 Essential (primary) hypertension: Secondary | ICD-10-CM

## 2022-06-12 ENCOUNTER — Other Ambulatory Visit: Payer: Self-pay | Admitting: Internal Medicine

## 2022-06-26 NOTE — Progress Notes (Signed)
Name: Henry [name]  MRN/ DOB: [id number], Jul 19, 1956    Age/ Sex: 66 y.o., male     PCP: Janith Lima, MD   Reason for Endocrinology Evaluation: Osteporosis     Initial Endocrinology Clinic Visit: 05/25/2067    PATIENT IDENTIFIER: Henry [name] is a 66 y.o., male with a past medical history of Osteoporosis, Myasthenia Gravis and DJD. He has followed with [facility] since 05/24/2020 for consultative assistance with management of his Osteoporosis .   HISTORICAL SUMMARY:  Pt was diagnosed with Osteoporosis 02/26/2019: Fracture Hx: no FH of osteoporosis or hip fracture: no Prior Hx of anti-resorptive therapy : no   DXA 02/2019 showed a T-score of -2.5 of the spine.   We started Caclium, vitamin D and Alendronate 05/2020   SUBJECTIVE:    Today (06/26/2022):  Henry [name] is here for a follow up on Osteoporosis .    He is compliant with Alendronate  Was seen by ortho for shoulder pains for a recent fall 2 months ago, no fractures  Was seen by cardiology 03/2022 for aortic valve and CA calcifications  Denies heartburn or constipation    Has myasthenia Gravis , but has been off medication due to resolution of symptoms for over year   Has been out of calcium for 2 days   HOME ENDOCRINE MEDICATION Calcium 600 mg BID Vitamin D 1000 iu daily  Alendronate 70 mg daily      HISTORY:  Past Medical History:  Past Medical History:  Diagnosis Date   Allergy    Anemia    Arthritis    Basal cell carcinoma 02/22/2021   nod-left anterior mandible (CX35FU + exc)   Depression    due to long tern hepatitis C - once the Hep C was cured, had not any issues until last year Dec 18, 2017) when is wife died   History of hepatitis C    has been treated in Dec 19, 2003   Hypertension    Pneumonia    as a teenager   Scoliosis    Past Surgical History:  Past Surgical History:  Procedure Laterality Date   ANTERIOR CERVICAL DECOMP/DISCECTOMY FUSION N/A 04/13/2019   Procedure:  ANTERIOR CERVICAL DECOMPRESSION/DISCECTOMY FUSION CERVICAL FOUR-FIVE, CERVICAL FIVE-SIX, CERVICAL SIX-SEVEN;  Surgeon: Kary Kos, MD;  Location: [facility];  Service: Neurosurgery;  Laterality: N/A;   COLONOSCOPY     RADIOLOGY WITH ANESTHESIA N/A 03/31/2019   Procedure: MRI WITH ANESTHESIA LUMBAR WITHOUT CONTRAST AND CERVICAL WITHOUT CONTRAST;  Surgeon: Radiologist, Medication, MD;  Location: [facility];  Service: Radiology;  Laterality: N/A;   TONSILLECTOMY AND ADENOIDECTOMY     Social History:  reports that he has quit smoking. His smoking use included cigarettes. He has never used smokeless tobacco. He reports current alcohol use of about 35.0 standard drinks of alcohol per week. He reports current drug use. Drug: Marijuana. Family History:  Family History  Problem Relation Age of Onset   Hypertension Mother    Hypertension Father    Alcoholism Brother      HOME MEDICATIONS: Allergies as of 06/28/2022       Reactions   Valacyclovir Rash        Medication List        Accurate as of June 26, 2022  2:04 PM. If you have any questions, ask your nurse or doctor.          alendronate 70 MG tablet Commonly known as: FOSAMAX TAKE 1 TABLET (70 MG TOTAL) BY MOUTH EVERY  7 DAYS WITH FULL GLASS WATER ON EMPTY STOMACH   allopurinol 100 MG tablet Commonly known as: ZYLOPRIM Take 1 tablet (100 mg total) by mouth daily.   indapamide 1.25 MG tablet Commonly known as: LOZOL TAKE 1 TABLET BY MOUTH DAILY.   methocarbamol 500 MG tablet Commonly known as: Robaxin Take 1 tablet (500 mg total) by mouth every 6 (six) hours as needed for muscle spasms.   olmesartan 20 MG tablet Commonly known as: BENICAR TAKE 1 TABLET BY MOUTH EVERY DAY   pyridostigmine 60 MG tablet Commonly known as: MESTINON Take 1 tablet (60 mg total) by mouth 3 (three) times daily.   rosuvastatin 5 MG tablet Commonly known as: Crestor Take 1 tablet (5 mg total) by mouth daily.   traMADol 50 MG tablet Commonly  known as: ULTRAM Take 1 tablet (50 mg total) by mouth every 6 (six) hours as needed.          OBJECTIVE:   PHYSICAL EXAM: VS: BP 136/88 (BP Location: Left Arm, Patient Position: Sitting, Cuff Size: Small)   Pulse 84   Ht '5\' 10"'$  (1.778 m)   Wt 163 lb (73.9 kg)   SpO2 99%   BMI 23.39 kg/m    EXAM: General: Pt appears well and is in NAD  Lungs: Clear with good BS bilat   Heart: Auscultation: RRR.  Abdomen: Normoactive bowel sounds, soft, nontender, without masses or organomegaly palpable  Extremities:  BL LE: No pretibial edema normal ROM and strength.  Mental Status: Judgment, insight: Intact Orientation: Oriented to time, place, and person Mood and affect: No depression, anxiety, or agitation     DATA REVIEWED:   Latest Reference Range & Units 01/25/22 15:39  Sodium 135 - 145 mEq/L 139  Potassium 3.5 - 5.1 mEq/L 4.3  Chloride 96 - 112 mEq/L 104  CO2 19 - 32 mEq/L 29  Glucose 70 - 99 mg/dL 85  BUN 6 - 23 mg/dL 22  Creatinine 0.40 - 1.50 mg/dL 1.03  Calcium 8.4 - 10.5 mg/dL 9.4  Alkaline Phosphatase 39 - 117 U/L 75  Albumin 3.5 - 5.2 g/dL 4.3  AST 0 - 37 U/L 21  ALT 0 - 53 U/L 14  Total Protein 6.0 - 8.3 g/dL 6.9  Bilirubin, Direct 0.0 - 0.3 mg/dL 0.3  Total Bilirubin 0.2 - 1.2 mg/dL 1.0  GFR >60.00 mL/min 74.81     ASSESSMENT / PLAN / RECOMMENDATIONS:   Osteoporosis:  -Emphasized the importance of optimizing calcium and vitamin D intake -Tolerating Alendronate without side effects  -We will proceed with repeat DXA scan at the  [facility] -If his DXA scan shows stability of improvement in BMD we will continue alendronate for a total of 5 years  Medications   Calcium 600 mg BID Vitamin D 1000 iu daily  Alendronate 70 mg daily   F/U in 1 yr   Signed electronically by: Mack Guise, MD  [facility]  [facility] [address] [place], [place] [address] Phone: [phone] FAX: [phone]       CC: Janith Lima, MD [place] [address] Phone: [phone]  Fax: [phone]   Return to Endocrinology clinic as below: Future Appointments  Date Time Provider [place]  06/28/2022  8:50 AM Zuriel Yeaman, Melanie Crazier, MD [facility] None  04/29/2023  9:00 AM [facility] NURSE 2 [facility] None

## 2022-06-28 ENCOUNTER — Ambulatory Visit (INDEPENDENT_AMBULATORY_CARE_PROVIDER_SITE_OTHER): Payer: Medicare Other | Admitting: Internal Medicine

## 2022-06-28 ENCOUNTER — Encounter: Payer: Self-pay | Admitting: Internal Medicine

## 2022-06-28 VITALS — BP 136/88 | HR 84 | Ht 70.0 in | Wt 163.0 lb

## 2022-06-28 DIAGNOSIS — M810 Age-related osteoporosis without current pathological fracture: Secondary | ICD-10-CM

## 2022-06-28 IMAGING — DX DG HIP (WITH OR WITHOUT PELVIS) 2-3V*R*
3 series · 3 of 3 positions shown · non-contrast
Comparison: None Available.

CLINICAL DATA: Right hip pain

EXAM:
DG HIP (WITH OR WITHOUT PELVIS) 2-3V RIGHT

[pelvis ap]
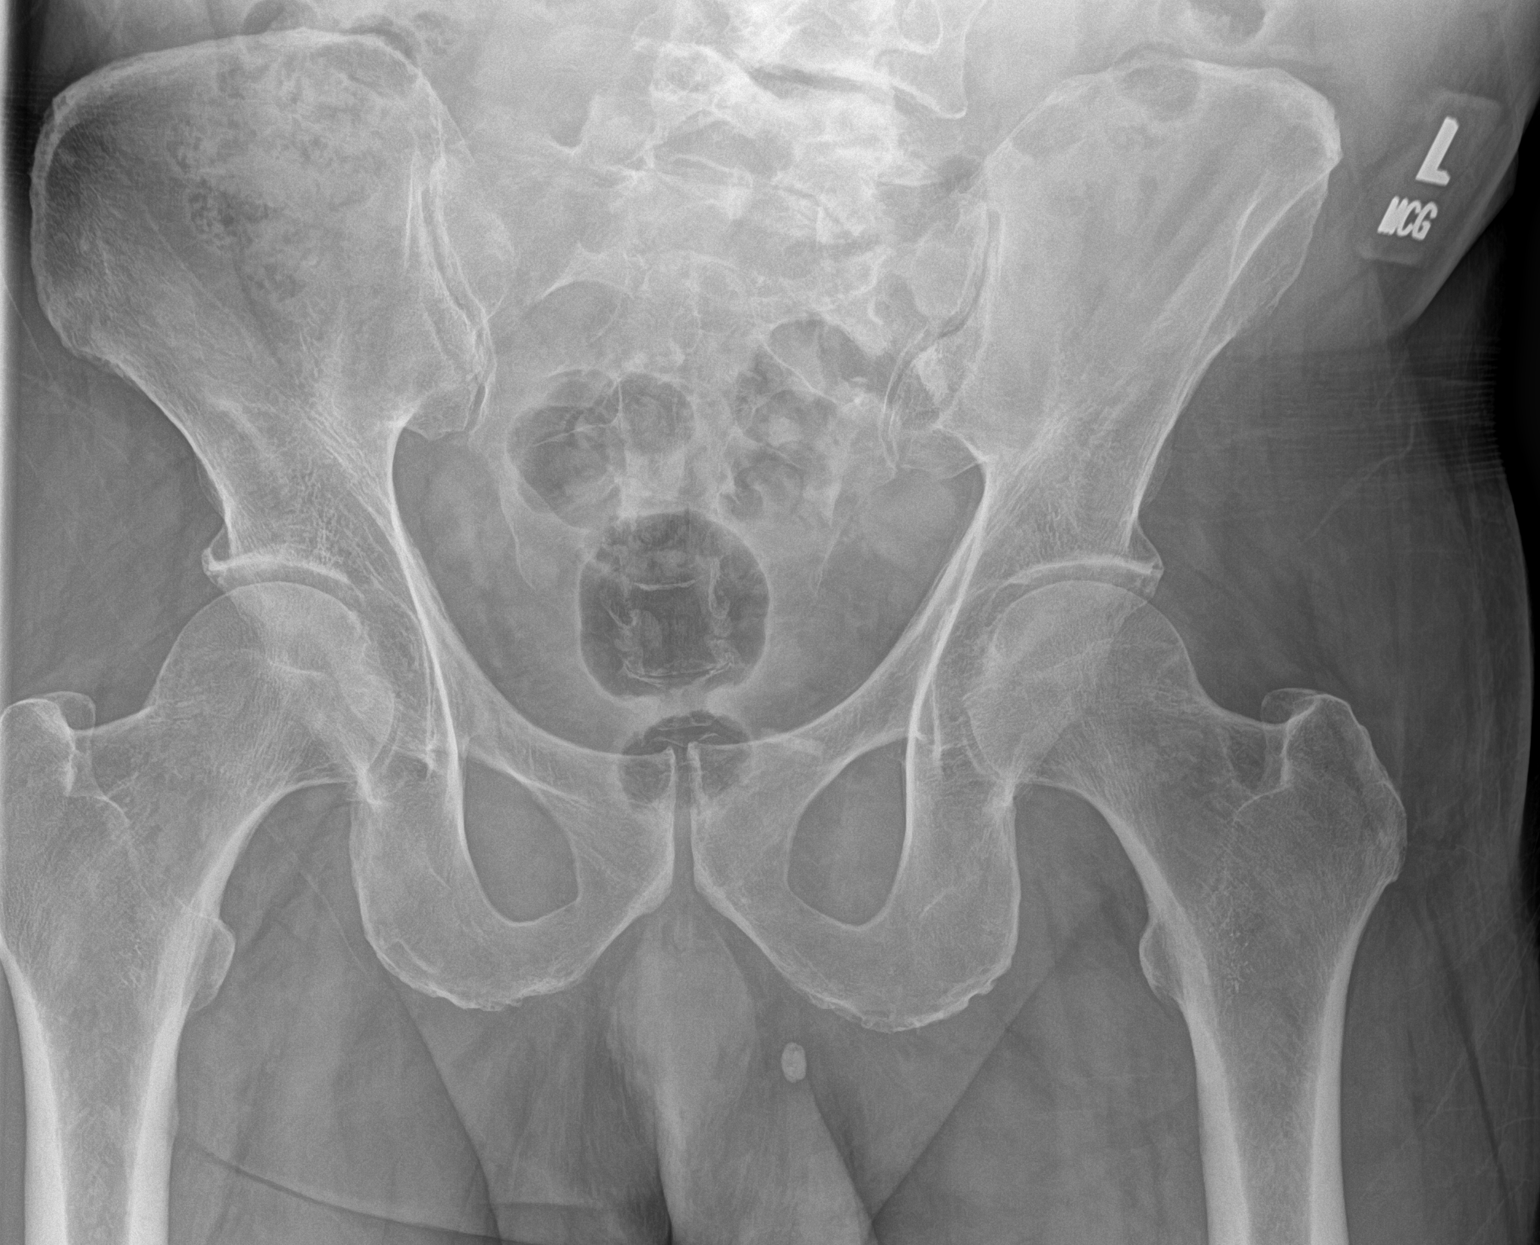

[hip ap]
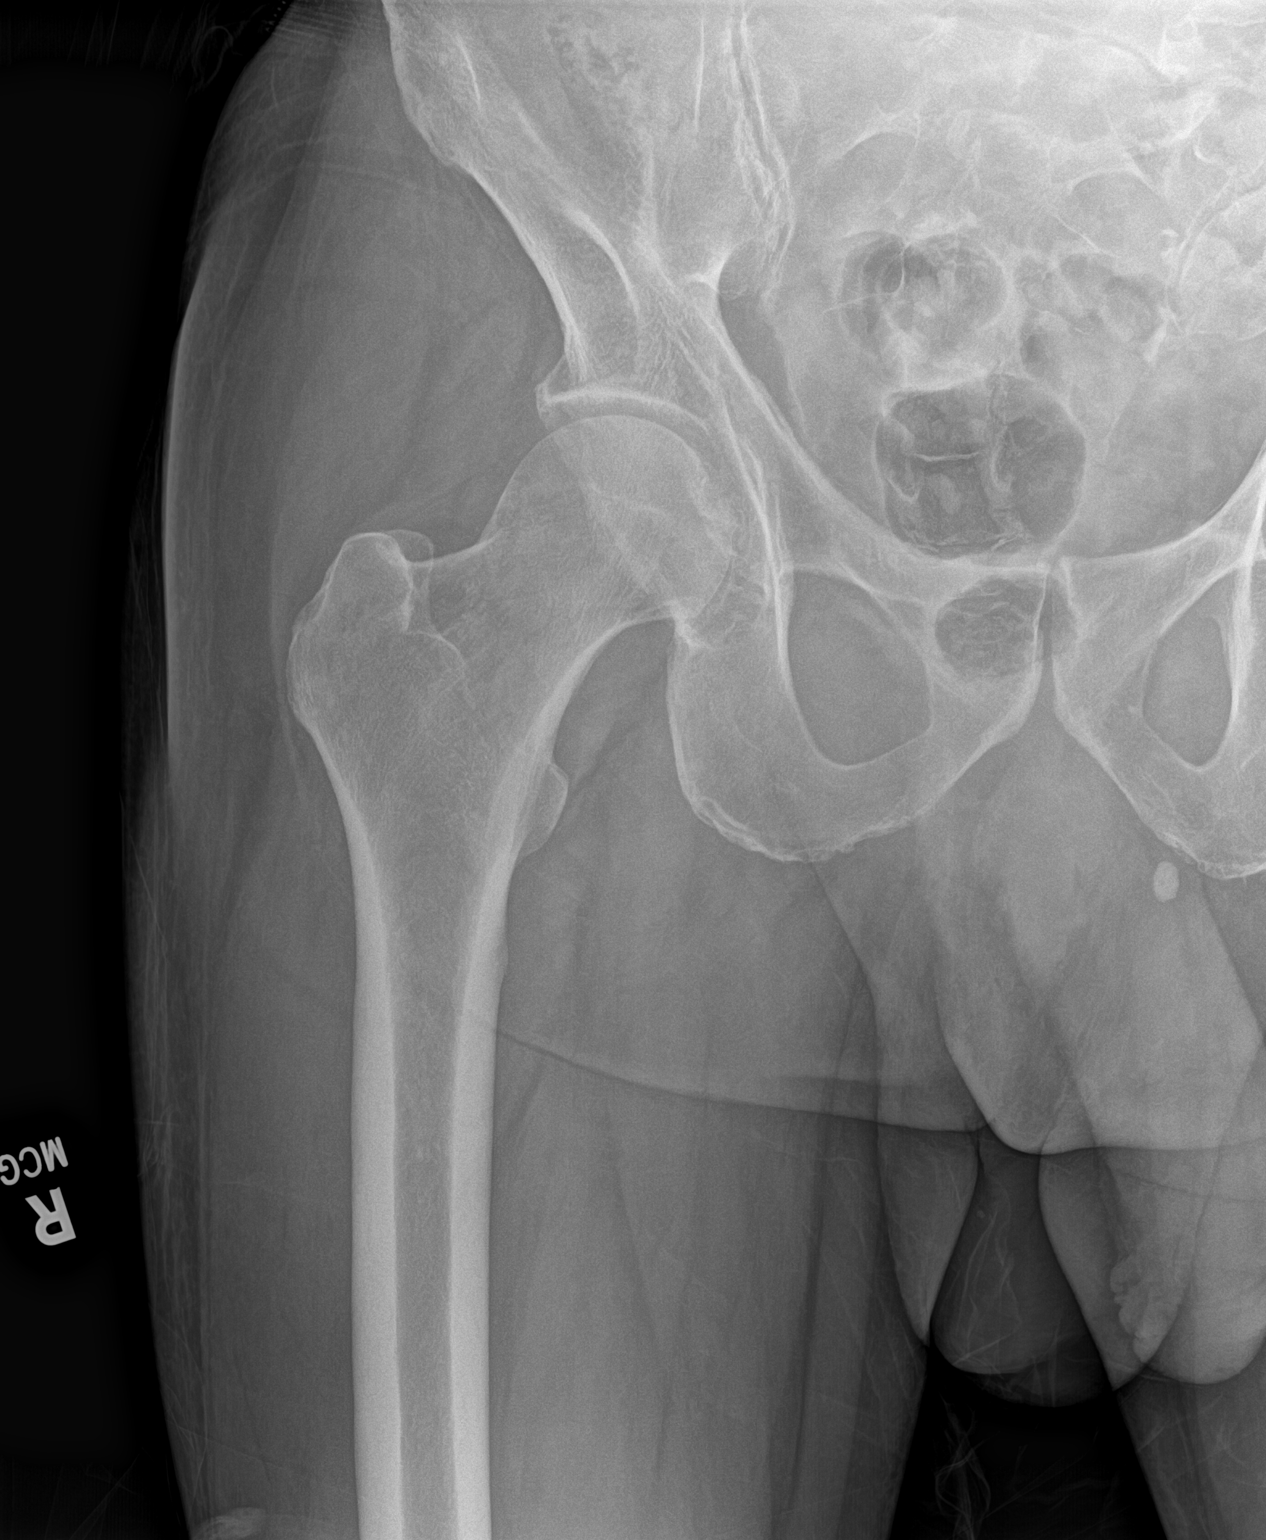

[hip frog leg]
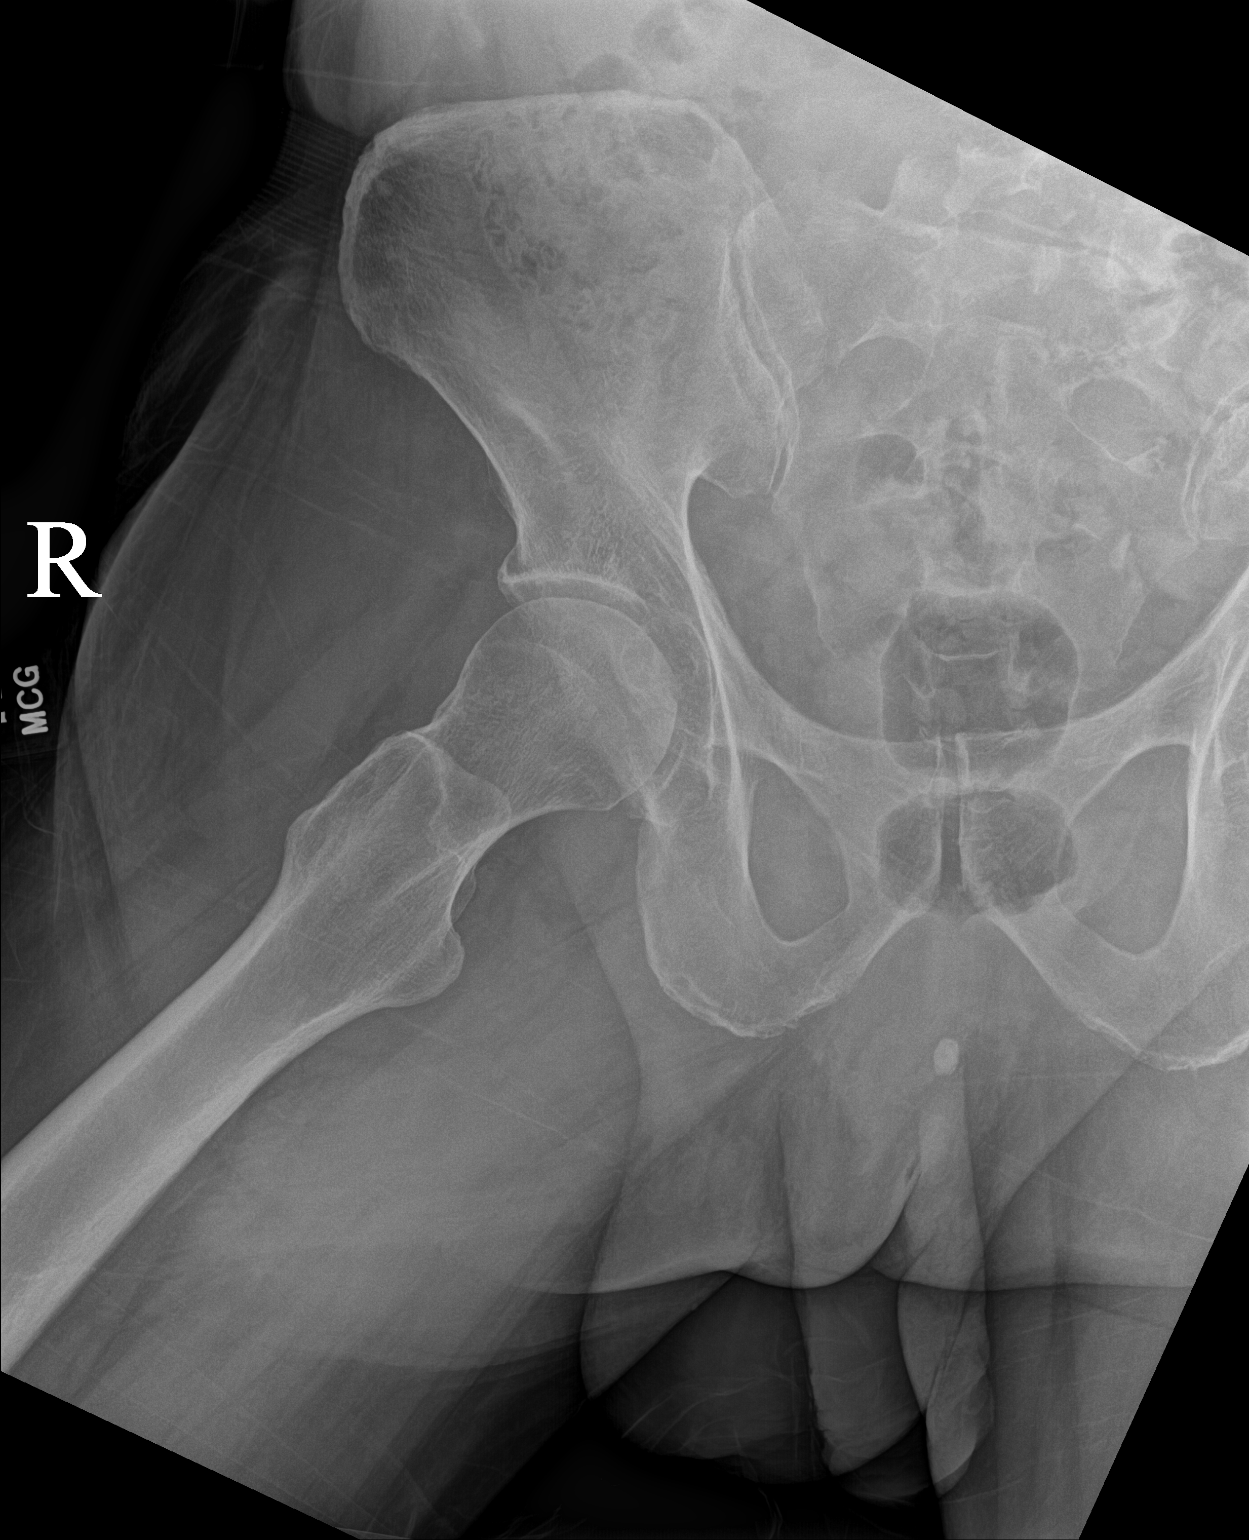

[3 of 3 positions shown; findings below may reference images not displayed]

FINDINGS: There is no evidence of hip fracture or dislocation. There is no
evidence of arthropathy or other focal bone abnormality.
IMPRESSION: Negative.

## 2022-06-28 MED ORDER — ALENDRONATE SODIUM 70 MG PO TABS: 70.0000 mg | ORAL_TABLET | ORAL | 3 refills | Status: AC

## 2022-06-28 NOTE — Patient Instructions (Signed)
Calcium 600 mg twice daily  Vitamin D 1000 iu daily  Alendronate 70 mg daily

## 2022-07-26 ENCOUNTER — Other Ambulatory Visit: Payer: Self-pay | Admitting: Internal Medicine

## 2022-07-26 DIAGNOSIS — I10 Essential (primary) hypertension: Secondary | ICD-10-CM

## 2022-07-26 DIAGNOSIS — M5416 Radiculopathy, lumbar region: Secondary | ICD-10-CM

## 2022-07-26 DIAGNOSIS — M4802 Spinal stenosis, cervical region: Secondary | ICD-10-CM

## 2022-08-14 ENCOUNTER — Ambulatory Visit
Admission: RE | Admit: 2022-08-14 | Discharge: 2022-08-14 | Disposition: A | Discharge status: Home / Self Care | Payer: Medicare Other | Source: Ambulatory Visit | Attending: Internal Medicine | Admitting: Internal Medicine

## 2022-08-14 DIAGNOSIS — M810 Age-related osteoporosis without current pathological fracture: Secondary | ICD-10-CM

## 2022-08-15 ENCOUNTER — Telehealth: Payer: Self-pay | Admitting: Internal Medicine

## 2022-08-15 NOTE — Telephone Encounter (Signed)
Pt has been informed that we do not carry RSV vaccines in office.   I also mentioned that Medicare does not cover Shingles vaccines given by PCP office and it would cost $295 plus the $59 administration fee if given here. I apologized that the information he received from his pharmacy was inaccurate.   Pt stated that he was upset and stated it is not professional that he can not receive vaccine here. I expressed that is insurance preference and PCP office has no influence over that decision.  I suggested that he try the health dept for those vaccines.

## 2022-08-15 NOTE — Telephone Encounter (Signed)
Pt says he has Medicare Part A & B. Pt does NOT have Part D for medications.  Cost $300 for RSV at [facility]. It is covered if given at [facility] office per [facility]. Pt also needs to know about the shingles vaccine, did he have both?   Please call pt [phone]

## 2022-09-10 ENCOUNTER — Other Ambulatory Visit: Payer: Self-pay | Admitting: Internal Medicine

## 2022-09-10 DIAGNOSIS — I10 Essential (primary) hypertension: Secondary | ICD-10-CM

## 2022-09-10 DIAGNOSIS — M1A09X0 Idiopathic chronic gout, multiple sites, without tophus (tophi): Secondary | ICD-10-CM

## 2022-12-06 ENCOUNTER — Other Ambulatory Visit: Payer: Medicare Other

## 2022-12-11 ENCOUNTER — Other Ambulatory Visit: Payer: Self-pay | Admitting: Internal Medicine

## 2022-12-11 DIAGNOSIS — I10 Essential (primary) hypertension: Secondary | ICD-10-CM

## 2023-01-21 ENCOUNTER — Other Ambulatory Visit: Payer: Self-pay | Admitting: Internal Medicine

## 2023-01-21 DIAGNOSIS — M5416 Radiculopathy, lumbar region: Secondary | ICD-10-CM

## 2023-01-21 DIAGNOSIS — M4802 Spinal stenosis, cervical region: Secondary | ICD-10-CM

## 2023-01-21 DIAGNOSIS — I10 Essential (primary) hypertension: Secondary | ICD-10-CM

## 2023-03-09 ENCOUNTER — Other Ambulatory Visit: Payer: Self-pay | Admitting: Internal Medicine

## 2023-03-09 DIAGNOSIS — M1A09X0 Idiopathic chronic gout, multiple sites, without tophus (tophi): Secondary | ICD-10-CM

## 2023-03-12 ENCOUNTER — Ambulatory Visit (HOSPITAL_COMMUNITY): Payer: Medicare Other | Attending: Internal Medicine

## 2023-03-12 DIAGNOSIS — I351 Nonrheumatic aortic (valve) insufficiency: Secondary | ICD-10-CM | POA: Insufficient documentation

## 2023-03-12 LAB — ECHOCARDIOGRAM COMPLETE
Area-P 1/2: 3.5 cm2
P 1/2 time: 504 msec
S' Lateral: 3.2 cm

## 2023-03-28 ENCOUNTER — Ambulatory Visit (INDEPENDENT_AMBULATORY_CARE_PROVIDER_SITE_OTHER): Payer: Medicare Other | Admitting: Internal Medicine

## 2023-03-28 VITALS — BP 146/96 | HR 73 | Temp 97.9°F | Ht 70.0 in | Wt 165.0 lb

## 2023-03-28 DIAGNOSIS — N400 Enlarged prostate without lower urinary tract symptoms: Secondary | ICD-10-CM

## 2023-03-28 DIAGNOSIS — K635 Polyp of colon: Secondary | ICD-10-CM | POA: Diagnosis not present

## 2023-03-28 DIAGNOSIS — M1A09X0 Idiopathic chronic gout, multiple sites, without tophus (tophi): Secondary | ICD-10-CM

## 2023-03-28 DIAGNOSIS — E7841 Elevated Lipoprotein(a): Secondary | ICD-10-CM

## 2023-03-28 DIAGNOSIS — M5416 Radiculopathy, lumbar region: Secondary | ICD-10-CM

## 2023-03-28 DIAGNOSIS — E785 Hyperlipidemia, unspecified: Secondary | ICD-10-CM

## 2023-03-28 DIAGNOSIS — M4802 Spinal stenosis, cervical region: Secondary | ICD-10-CM

## 2023-03-28 DIAGNOSIS — G7000 Myasthenia gravis without (acute) exacerbation: Secondary | ICD-10-CM | POA: Diagnosis not present

## 2023-03-28 DIAGNOSIS — I10 Essential (primary) hypertension: Secondary | ICD-10-CM | POA: Diagnosis not present

## 2023-03-28 LAB — URINALYSIS, ROUTINE W REFLEX MICROSCOPIC
Bilirubin Urine: NEGATIVE
Leukocytes,Ua: NEGATIVE
Nitrite: NEGATIVE
Specific Gravity, Urine: 1.025 (ref 1.000–1.030)
Total Protein, Urine: NEGATIVE
Urine Glucose: NEGATIVE
Urobilinogen, UA: 0.2 (ref 0.0–1.0)
pH: 6.5 (ref 5.0–8.0)

## 2023-03-28 LAB — BASIC METABOLIC PANEL
BUN: 17 mg/dL (ref 6–23)
CO2: 29 mEq/L (ref 19–32)
Calcium: 10.3 mg/dL (ref 8.4–10.5)
Chloride: 103 mEq/L (ref 96–112)
Creatinine, Ser: 0.87 mg/dL (ref 0.40–1.50)
GFR: 88.13 mL/min (ref >60.00)
Glucose, Bld: 102 mg/dL — ABNORMAL HIGH (ref 70–99)
Potassium: 4.4 mEq/L (ref 3.5–5.1)
Sodium: 141 mEq/L (ref 135–145)

## 2023-03-28 LAB — CBC WITH DIFFERENTIAL/PLATELET
Basophils Absolute: 0 10*3/uL (ref 0.0–0.1)
Basophils Relative: 0.4 % (ref 0.0–3.0)
Eosinophils Absolute: 0.3 10*3/uL (ref 0.0–0.7)
Eosinophils Relative: 5.5 % — ABNORMAL HIGH (ref 0.0–5.0)
HCT: 48.1 % (ref 39.0–52.0)
Hemoglobin: 16.2 g/dL (ref 13.0–17.0)
Lymphocytes Relative: 24.7 % (ref 12.0–46.0)
Lymphs Abs: 1.5 10*3/uL (ref 0.7–4.0)
MCHC: 33.6 g/dL (ref 30.0–36.0)
MCV: 92.7 fl (ref 78.0–100.0)
Monocytes Absolute: 0.5 10*3/uL (ref 0.1–1.0)
Monocytes Relative: 8.3 % (ref 3.0–12.0)
Neutro Abs: 3.6 10*3/uL (ref 1.4–7.7)
Neutrophils Relative %: 61.1 % (ref 43.0–77.0)
Platelets: 283 10*3/uL (ref 150.0–400.0)
RBC: 5.19 Mil/uL (ref 4.22–5.81)
RDW: 13.3 % (ref 11.5–15.5)
WBC: 5.9 10*3/uL (ref 4.0–10.5)

## 2023-03-28 LAB — HEPATIC FUNCTION PANEL
ALT: 13 U/L (ref 0–53)
AST: 19 U/L (ref 0–37)
Albumin: 4.6 g/dL (ref 3.5–5.2)
Alkaline Phosphatase: 66 U/L (ref 39–117)
Bilirubin, Direct: 0.3 mg/dL (ref 0.0–0.3)
Total Bilirubin: 1.3 mg/dL — ABNORMAL HIGH (ref 0.2–1.2)
Total Protein: 7.4 g/dL (ref 6.0–8.3)

## 2023-03-28 LAB — LIPID PANEL
Cholesterol: 160 mg/dL (ref 0–200)
HDL: 65.1 mg/dL (ref >39.00)
LDL Cholesterol: 68 mg/dL (ref 0–99)
NonHDL: 94.62
Total CHOL/HDL Ratio: 2
Triglycerides: 131 mg/dL (ref 0.0–149.0)
VLDL: 26.2 mg/dL (ref 0.0–40.0)

## 2023-03-28 LAB — URIC ACID: Uric Acid, Serum: 5 mg/dL (ref 4.0–7.8)

## 2023-03-28 LAB — TSH: TSH: 1.31 u[IU]/mL (ref 0.35–5.50)

## 2023-03-28 LAB — PSA: PSA: 1.09 ng/mL (ref 0.10–4.00)

## 2023-03-28 MED ORDER — ROSUVASTATIN CALCIUM 5 MG PO TABS
5.0000 mg | ORAL_TABLET | Freq: Every day | ORAL | 1 refills | Status: DC
Start: 2023-03-28 — End: 2023-12-10

## 2023-03-28 MED ORDER — TRAMADOL HCL 50 MG PO TABS
50.0000 mg | ORAL_TABLET | Freq: Four times a day (QID) | ORAL | 0 refills | Status: DC | PRN
Start: 2023-03-28 — End: 2023-12-10

## 2023-03-28 MED ORDER — INDAPAMIDE 1.25 MG PO TABS
1.2500 mg | ORAL_TABLET | Freq: Every day | ORAL | 1 refills | Status: DC
Start: 2023-03-28 — End: 2023-12-11

## 2023-03-28 MED ORDER — OLMESARTAN MEDOXOMIL 20 MG PO TABS
20.0000 mg | ORAL_TABLET | Freq: Every day | ORAL | 1 refills | Status: DC
Start: 2023-03-28 — End: 2023-12-11

## 2023-03-28 NOTE — Patient Instructions (Signed)
Hypertension, Adult High blood pressure (hypertension) is when the force of blood pumping through the arteries is too strong. The arteries are the blood vessels that carry blood from the heart throughout the body. Hypertension forces the heart to work harder to pump blood and may cause arteries to become narrow or stiff. Untreated or uncontrolled hypertension can lead to a heart attack, heart failure, a stroke, kidney disease, and other problems. A blood pressure reading consists of a higher number over a lower number. Ideally, your blood pressure should be below 120/80. The first ("top") number is called the systolic pressure. It is a measure of the pressure in your arteries as your heart beats. The second ("bottom") number is called the diastolic pressure. It is a measure of the pressure in your arteries as the heart relaxes. What are the causes? The exact cause of this condition is not known. There are some conditions that result in high blood pressure. What increases the risk? Certain factors may make you more likely to develop high blood pressure. Some of these risk factors are under your control, including: Smoking. Not getting enough exercise or physical activity. Being overweight. Having too much fat, sugar, calories, or salt (sodium) in your diet. Drinking too much alcohol. Other risk factors include: Having a personal history of heart disease, diabetes, high cholesterol, or kidney disease. Stress. Having a family history of high blood pressure and high cholesterol. Having obstructive sleep apnea. Age. The risk increases with age. What are the signs or symptoms? High blood pressure may not cause symptoms. Very high blood pressure (hypertensive crisis) may cause: Headache. Fast or irregular heartbeats (palpitations). Shortness of breath. Nosebleed. Nausea and vomiting. Vision changes. Severe chest pain, dizziness, and seizures. How is this diagnosed? This condition is diagnosed by  measuring your blood pressure while you are seated, with your arm resting on a flat surface, your legs uncrossed, and your feet flat on the floor. The cuff of the blood pressure monitor will be placed directly against the skin of your upper arm at the level of your heart. Blood pressure should be measured at least twice using the same arm. Certain conditions can cause a difference in blood pressure between your right and left arms. If you have a high blood pressure reading during one visit or you have normal blood pressure with other risk factors, you may be asked to: Return on a different day to have your blood pressure checked again. Monitor your blood pressure at home for 1 week or longer. If you are diagnosed with hypertension, you may have other blood or imaging tests to help your health care provider understand your overall risk for other conditions. How is this treated? This condition is treated by making healthy lifestyle changes, such as eating healthy foods, exercising more, and reducing your alcohol intake. You may be referred for counseling on a healthy diet and physical activity. Your health care provider may prescribe medicine if lifestyle changes are not enough to get your blood pressure under control and if: Your systolic blood pressure is above 130. Your diastolic blood pressure is above 80. Your personal target blood pressure may vary depending on your medical conditions, your age, and other factors. Follow these instructions at home: Eating and drinking  Eat a diet that is high in fiber and potassium, and low in sodium, added sugar, and fat. An example of this eating plan is called the DASH diet. DASH stands for Dietary Approaches to Stop Hypertension. To eat this way: Eat   plenty of fresh fruits and vegetables. Try to fill one half of your plate at each meal with fruits and vegetables. Eat whole grains, such as whole-wheat pasta, brown rice, or whole-grain bread. Fill about one  fourth of your plate with whole grains. Eat or drink low-fat dairy products, such as skim milk or low-fat yogurt. Avoid fatty cuts of meat, processed or cured meats, and poultry with skin. Fill about one fourth of your plate with lean proteins, such as fish, chicken without skin, beans, eggs, or tofu. Avoid pre-made and processed foods. These tend to be higher in sodium, added sugar, and fat. Reduce your daily sodium intake. Many people with hypertension should eat less than 1,500 mg of sodium a day. Do not drink alcohol if: Your health care provider tells you not to drink. You are pregnant, may be pregnant, or are planning to become pregnant. If you drink alcohol: Limit how much you have to: 0-1 drink a day for women. 0-2 drinks a day for men. Know how much alcohol is in your drink. In the [place] one drink equals one 12 oz bottle of beer (355 mL), one 5 oz glass of wine (148 mL), or one 1 oz glass of hard liquor (44 mL). Lifestyle  Work with your health care provider to maintain a healthy body weight or to lose weight. Ask what an ideal weight is for you. Get at least 30 minutes of exercise that causes your heart to beat faster (aerobic exercise) most days of the week. Activities may include walking, swimming, or biking. Include exercise to strengthen your muscles (resistance exercise), such as Pilates or lifting weights, as part of your weekly exercise routine. Try to do these types of exercises for 30 minutes at least 3 days a week. Do not use any products that contain nicotine or tobacco. These products include cigarettes, chewing tobacco, and vaping devices, such as e-cigarettes. If you need help quitting, ask your health care provider. Monitor your blood pressure at home as told by your health care provider. Keep all follow-up visits. This is important. Medicines Take over-the-counter and prescription medicines only as told by your health care provider. Follow directions carefully. Blood  pressure medicines must be taken as prescribed. Do not skip doses of blood pressure medicine. Doing this puts you at risk for problems and can make the medicine less effective. Ask your health care provider about side effects or reactions to medicines that you should watch for. Contact a health care provider if you: Think you are having a reaction to a medicine you are taking. Have headaches that keep coming back (recurring). Feel dizzy. Have swelling in your ankles. Have trouble with your vision. Get help right away if you: Develop a severe headache or confusion. Have unusual weakness or numbness. Feel faint. Have severe pain in your chest or abdomen. Vomit repeatedly. Have trouble breathing. These symptoms may be an emergency. Get help right away. Call 911. Do not wait to see if the symptoms will go away. Do not drive yourself to the hospital. Summary Hypertension is when the force of blood pumping through your arteries is too strong. If this condition is not controlled, it may put you at risk for serious complications. Your personal target blood pressure may vary depending on your medical conditions, your age, and other factors. For most people, a normal blood pressure is less than 120/80. Hypertension is treated with lifestyle changes, medicines, or a combination of both. Lifestyle changes include losing weight, eating a healthy,   low-sodium diet, exercising more, and limiting alcohol. This information is not intended to replace advice given to you by your health care provider. Make sure you discuss any questions you have with your health care provider. Document Revised: 07/04/2021 Document Reviewed: 07/04/2021 Elsevier Patient Education  2024 [employer]  

## 2023-03-28 NOTE — Progress Notes (Unsigned)
Subjective:  Patient ID: Henry [name], male    DOB: 30-May-1956  Age: 67 y.o. MRN: [id number]  CC: Hypertension and Hyperlipidemia   HPI Henry [name] presents for f/up ---  Discussed the use of AI scribe software for clinical note transcription with the patient, who gave verbal consent to proceed.  History of Present Illness   The patient, with a history of myasthenia gravis and vertigo, presents with intermittent shortness of breath and dizziness. They report that their myasthenia gravis symptoms had previously resolved, but over the past week, they have noticed a recurrence of left eye ptosis, necessitating daily medication. The patient also experiences shortness of breath during exertion, such as taking out the trash. However, they deny any chest pain or palpitations.  Regarding their cardiac health, the patient recently had an echocardiogram, which revealed two slightly leaking valves. Despite this, their cardiologist reportedly considers them to be one of their healthiest patients. The patient denies any history of heart attacks or symptoms suggestive of blocked arteries.  The patient admits to drinking alcohol daily and smoking marijuana but quit cigarette smoking 30 years ago. They were not a heavy smoker prior to quitting.       Outpatient Medications Prior to Visit  Medication Sig Dispense Refill   alendronate (FOSAMAX) 70 MG tablet Take 1 tablet (70 mg total) by mouth once a week. Take with a full glass of water on an empty stomach. 12 tablet 3   methocarbamol (ROBAXIN) 500 MG tablet Take 1 tablet (500 mg total) by mouth every 6 (six) hours as needed for muscle spasms. 40 tablet 0   pyridostigmine (MESTINON) 60 MG tablet Take 1 tablet (60 mg total) by mouth 3 (three) times daily. 270 tablet 1   allopurinol (ZYLOPRIM) 100 MG tablet TAKE 1 TABLET BY MOUTH EVERY DAY 90 tablet 0   indapamide (LOZOL) 1.25 MG tablet TAKE 1 TABLET BY MOUTH EVERY DAY 90 tablet 0   olmesartan (BENICAR)  20 MG tablet TAKE 1 TABLET BY MOUTH EVERY DAY 90 tablet 0   rosuvastatin (CRESTOR) 5 MG tablet Take 1 tablet (5 mg total) by mouth daily. 90 tablet 1   traMADol (ULTRAM) 50 MG tablet TAKE 1 TABLET BY MOUTH EVERY 6 HOURS AS NEEDED. 360 tablet 0   No facility-administered medications prior to visit.    ROS Review of Systems  Objective:  BP 134/82 (BP Location: Right Arm, Patient Position: Sitting, Cuff Size: Large)   Pulse 73   Temp 97.9 F (36.6 C) (Oral)   Ht 5\' 10"  (1.778 m)   Wt 165 lb (74.8 kg)   SpO2 93%   BMI 23.68 kg/m   BP Readings from Last 3 Encounters:  03/28/23 134/82  06/28/22 136/88  04/26/22 134/86    Wt Readings from Last 3 Encounters:  03/28/23 165 lb (74.8 kg)  06/28/22 163 lb (73.9 kg)  04/26/22 165 lb (74.8 kg)    Physical Exam Cardiovascular:     Rate and Rhythm: Normal rate and regular rhythm. Occasional Extrasystoles are present.    Heart sounds: Normal heart sounds, S1 normal and S2 normal.     Comments: EKG- SR with occasional PVC, 70 bpm +LVH Septal infarct pattern is old No acute ST/T wave changes    Lab Results  Component Value Date   WBC 5.9 03/28/2023   HGB 16.2 03/28/2023   HCT 48.1 03/28/2023   PLT 283.0 03/28/2023   GLUCOSE 102 (H) 03/28/2023   CHOL 160 03/28/2023  TRIG 131.0 03/28/2023   HDL 65.10 03/28/2023   LDLCALC 68 03/28/2023   ALT 13 03/28/2023   AST 19 03/28/2023   NA 141 03/28/2023   K 4.4 03/28/2023   CL 103 03/28/2023   CREATININE 0.87 03/28/2023   BUN 17 03/28/2023   CO2 29 03/28/2023   TSH 1.31 03/28/2023   PSA 1.09 03/28/2023   HGBA1C 5.0 04/13/2019    DG Bone Density  Result Date: 08/14/2022 EXAM: DUAL X-RAY ABSORPTIOMETRY (DXA) FOR BONE MINERAL DENSITY IMPRESSION: Referring Physician:  Orland Penman Your patient completed a bone mineral density test using GE Lunar iDXA system (analysis version: 16). Technologist: lmn PATIENT: Name: Henry [name] Patient ID: [id number] Birth Date:  27-Oct-1955 Height: 68.2 in. Sex: Male Measured: 08/14/2022 Weight: 165.6 lbs. Indications: Alcohol (3 or more units per day), Height Loss (781.91), scoliosis Fractures: NONE Treatments: Calcium (E943.0), Fosamax, Vitamin D (E933.5) ASSESSMENT: The BMD measured at AP Spine L1-L2 is 0.909 g/cm2 with a T-score of -2.1. This patient is considered osteopenic/low bone mass according to World Health Organization [facility]) criteria. The quality of the exam is good. L3 and L4 were excluded due to degenerative changes noted on previous exam. FRAX was not calculated because of current treatment with alendronate. Site Region Measured Date Measured Age YA BMD Significant CHANGE T-score AP Spine  L1-L2      6/14/2020    68.7         -2.1    0.909 g/cm2 AP Spine  L1-L2      02/26/2019    65.2         -2.5    0.865 g/cm2 DualFemur Neck Right 08/14/2022    68.7         -1.6    0.817 g/cm2 DualFemur Neck Right 02/26/2019    65.2         -1.8    0.786 g/cm2 DualFemur Total Mean 08/14/2022    68.7         -1.3    0.848 g/cm2 DualFemur Total Mean 02/26/2019    65.2         -1.4    0.836 g/cm2 World Health Organization [facility]) criteria for post-menopausal, Caucasian Women: Normal       T-score at or above -1 SD Osteopenia   T-score between -1 and -2.5 SD Osteoporosis T-score at or below -2.5 SD RECOMMENDATION: National Osteoporosis Foundation recommends that FDA-approved medical therapies be considered in postmenopausal women and men age 10 or older with a: 1. Hip or vertebral (clinical or morphometric) fracture. 2. T-score of less than or equal to -2.5 at the spine or hip. 3. Ten-year fracture probability by FRAX of 3% or greater for hip fracture or 20% or greater for major osteoporotic fracture. All treatment decisions require clinical judgment and consideration of individual patient factors, including patient preferences, co-morbidities, previous drug use, risk factors not captured in the FRAX model (e.g. falls, vitamin D deficiency,  increased bone turnover, interval significant decline in bone density) and possible under- or over-estimation of fracture risk by FRAX. All patients should ensure an adequate intake of dietary calcium (1200 mg/d) and vitamin D (800 IU daily) unless contraindicated. FOLLOW-UP: People with diagnosed cases of osteoporosis or osteopenia should be regularly tested for bone mineral density. For patients eligible for Medicare, routine testing is allowed once every 2 years. The testing frequency can be increased to one year for patients who have rapidly progressing disease, or for those who are receiving medical therapy to  restore bone mass. I have reviewed this study and agree with the findings. Mark A. Tyron Russell, M.D. [facility] Electronically Signed   By: Ulyses Southward M.D.   On: 08/14/2022 14:58    Assessment & Plan:  Benign prostatic hyperplasia without lower urinary tract symptoms -     PSA; Future -     Urinalysis, Routine w reflex microscopic; Future  Hyperlipidemia LDL goal <130 -     Lipid panel; Future -     Lipoprotein A (LPA); Future -     TSH; Future -     Hepatic function panel; Future -     Rosuvastatin Calcium; Take 1 tablet (5 mg total) by mouth daily.  Dispense: 90 tablet; Refill: 1  Myasthenia gravis (HCC) -     Ambulatory referral to Neurology  Polyp of colon, unspecified part of colon, unspecified type -     Ambulatory referral to Gastroenterology  Primary hypertension -     Basic metabolic panel; Future -     CBC with Differential/Platelet; Future -     TSH; Future -     Urinalysis, Routine w reflex microscopic; Future -     EKG 12-Lead -     Olmesartan Medoxomil; Take 1 tablet (20 mg total) by mouth daily.  Dispense: 90 tablet; Refill: 1 -     Indapamide; Take 1 tablet (1.25 mg total) by mouth daily.  Dispense: 90 tablet; Refill: 1  Idiopathic chronic gout of multiple sites without tophus -     Uric acid; Future  Left lumbar radiculitis -     traMADol HCl;  Take 1 tablet (50 mg total) by mouth every 6 (six) hours as needed.  Dispense: 360 tablet; Refill: 0  Spinal stenosis in cervical region -     traMADol HCl; Take 1 tablet (50 mg total) by mouth every 6 (six) hours as needed.  Dispense: 360 tablet; Refill: 0     Follow-up: Return in about 6 months (around 09/28/2023).  Sanda Linger, MD

## 2023-04-03 DIAGNOSIS — E7841 Elevated Lipoprotein(a): Secondary | ICD-10-CM | POA: Insufficient documentation

## 2023-04-03 LAB — LIPOPROTEIN A (LPA): Lipoprotein (a): 176 nmol/L — ABNORMAL HIGH (ref <75)

## 2023-04-10 ENCOUNTER — Encounter (INDEPENDENT_AMBULATORY_CARE_PROVIDER_SITE_OTHER): Payer: Self-pay

## 2023-04-26 ENCOUNTER — Other Ambulatory Visit: Payer: Medicare Other

## 2023-04-29 NOTE — Progress Notes (Signed)
This encounter was created in error - please disregard.  I called patient and he said he was not able to have this appointment due to him working.  Patient said that he will call back to reschedule appointment.

## 2023-11-27 ENCOUNTER — Other Ambulatory Visit: Payer: Self-pay | Admitting: Internal Medicine

## 2023-11-27 DIAGNOSIS — M5416 Radiculopathy, lumbar region: Secondary | ICD-10-CM

## 2023-11-27 DIAGNOSIS — M4802 Spinal stenosis, cervical region: Secondary | ICD-10-CM

## 2023-11-27 NOTE — Telephone Encounter (Signed)
 Copied from CRM [id number]. Topic: Clinical - Medication Refill >> Nov 27, 2023 12:44 PM Almira Coaster wrote: Most Recent Primary Care Visit:  Provider: Wyvonne Lenz  Department: [facility]  Visit Type: MEDICARE AWV, SEQUENTIAL  Date: 04/29/2023  Medication: traMADol (ULTRAM) 50 MG tablet  Has the patient contacted their pharmacy? Yes, they have sent a request to the office but have not gotten a response. (Agent: If no, request that the patient contact the pharmacy for the refill. If patient does not wish to contact the pharmacy document the reason why and proceed with request.) (Agent: If yes, when and what did the pharmacy advise?)  Is this the correct pharmacy for this prescription? Yes If no, delete pharmacy and type the correct one.  This is the patient's preferred pharmacy:   [id number] [place], [place] - [address] AT [facility] [address] [place] [address] Phone: [phone] Fax: [phone]     Has the prescription been filled recently? No  Is the patient out of the medication? Yes  Has the patient been seen for an appointment in the last year OR does the patient have an upcoming appointment? Yes  Can we respond through MyChart? Yes  Agent: Please be advised that Rx refills may take up to 3 business days. We ask that you follow-up with your pharmacy.

## 2023-11-28 ENCOUNTER — Other Ambulatory Visit: Payer: Self-pay

## 2023-11-28 ENCOUNTER — Telehealth: Payer: Self-pay

## 2023-11-28 DIAGNOSIS — M4802 Spinal stenosis, cervical region: Secondary | ICD-10-CM

## 2023-11-28 DIAGNOSIS — M5416 Radiculopathy, lumbar region: Secondary | ICD-10-CM

## 2023-11-28 NOTE — Telephone Encounter (Signed)
 Copied from CRM [id number]. Topic: Clinical - Medication Refill >> Nov 27, 2023 12:44 PM Almira Coaster wrote: Most Recent Primary Care Visit:  Provider: Wyvonne Lenz  Department: [facility]  Visit Type: MEDICARE AWV, SEQUENTIAL  Date: 04/29/2023  Medication: traMADol (ULTRAM) 50 MG tablet  Has the patient contacted their pharmacy? Yes, they have sent a request to the office but have not gotten a response. (Agent: If no, request that the patient contact the pharmacy for the refill. If patient does not wish to contact the pharmacy document the reason why and proceed with request.) (Agent: If yes, when and what did the pharmacy advise?)  Is this the correct pharmacy for this prescription? Yes If no, delete pharmacy and type the correct one.  This is the patient's preferred pharmacy:   [id number] [place], [place] - [address] AT [facility] [address] [place] [address] Phone: [phone] Fax: [phone]     Has the prescription been filled recently? No  Is the patient out of the medication? Yes  Has the patient been seen for an appointment in the last year OR does the patient have an upcoming appointment? Yes  Can we respond through MyChart? Yes  Agent: Please be advised that Rx refills may take up to 3 business days. We ask that you follow-up with your pharmacy. >> Nov 28, 2023 12:12 PM Almira Coaster wrote: Patient is calling to follow up on this request. Patient is out of medication and really needs it to be filled.

## 2023-11-28 NOTE — Telephone Encounter (Signed)
 Medication refill has been sent to Dr. Yetta Barre

## 2023-12-03 NOTE — Telephone Encounter (Signed)
 Copied from CRM [id number]. Topic: Clinical - Prescription Issue >> Dec 03, 2023  3:02 PM Isabelle Course C wrote: Reason for CRM: Patient called about medication traMADol (ULTRAM) 50 MG tablet. He called about this last week and have been out of this medication for a week. Patient stated that he feels neglected and will go somewhere else if doctor does not want to communicate with him

## 2023-12-03 NOTE — Telephone Encounter (Signed)
 Patient has been scheduled for an appointment.

## 2023-12-10 ENCOUNTER — Ambulatory Visit (INDEPENDENT_AMBULATORY_CARE_PROVIDER_SITE_OTHER): Admitting: Internal Medicine

## 2023-12-10 VITALS — BP 144/94 | HR 73 | Temp 98.2°F | Resp 16 | Ht 70.0 in | Wt 167.2 lb

## 2023-12-10 DIAGNOSIS — M4802 Spinal stenosis, cervical region: Secondary | ICD-10-CM

## 2023-12-10 DIAGNOSIS — I493 Ventricular premature depolarization: Secondary | ICD-10-CM

## 2023-12-10 DIAGNOSIS — R0609 Other forms of dyspnea: Secondary | ICD-10-CM | POA: Insufficient documentation

## 2023-12-10 DIAGNOSIS — M810 Age-related osteoporosis without current pathological fracture: Secondary | ICD-10-CM

## 2023-12-10 DIAGNOSIS — E785 Hyperlipidemia, unspecified: Secondary | ICD-10-CM

## 2023-12-10 DIAGNOSIS — E559 Vitamin D deficiency, unspecified: Secondary | ICD-10-CM | POA: Diagnosis not present

## 2023-12-10 DIAGNOSIS — I10 Essential (primary) hypertension: Secondary | ICD-10-CM | POA: Diagnosis not present

## 2023-12-10 DIAGNOSIS — M5416 Radiculopathy, lumbar region: Secondary | ICD-10-CM

## 2023-12-10 LAB — CBC WITH DIFFERENTIAL/PLATELET
Basophils Absolute: 0 10*3/uL (ref 0.0–0.1)
Basophils Relative: 0.4 % (ref 0.0–3.0)
Eosinophils Absolute: 0.3 10*3/uL (ref 0.0–0.7)
Eosinophils Relative: 3.8 % (ref 0.0–5.0)
HCT: 43.7 % (ref 39.0–52.0)
Hemoglobin: 15.3 g/dL (ref 13.0–17.0)
Lymphocytes Relative: 22.4 % (ref 12.0–46.0)
Lymphs Abs: 1.8 10*3/uL (ref 0.7–4.0)
MCHC: 34.9 g/dL (ref 30.0–36.0)
MCV: 92.7 fl (ref 78.0–100.0)
Monocytes Absolute: 0.7 10*3/uL (ref 0.1–1.0)
Monocytes Relative: 8.5 % (ref 3.0–12.0)
Neutro Abs: 5.2 10*3/uL (ref 1.4–7.7)
Neutrophils Relative %: 64.9 % (ref 43.0–77.0)
Platelets: 244 10*3/uL (ref 150.0–400.0)
RBC: 4.71 Mil/uL (ref 4.22–5.81)
RDW: 13 % (ref 11.5–15.5)
WBC: 8.1 10*3/uL (ref 4.0–10.5)

## 2023-12-10 LAB — BASIC METABOLIC PANEL WITH GFR
BUN: 18 mg/dL (ref 6–23)
CO2: 31 meq/L (ref 19–32)
Calcium: 9.7 mg/dL (ref 8.4–10.5)
Chloride: 102 meq/L (ref 96–112)
Creatinine, Ser: 0.93 mg/dL (ref 0.40–1.50)
GFR: 83.45 mL/min (ref >60.00)
Glucose, Bld: 101 mg/dL — ABNORMAL HIGH (ref 70–99)
Potassium: 3.7 meq/L (ref 3.5–5.1)
Sodium: 140 meq/L (ref 135–145)

## 2023-12-10 LAB — HEPATIC FUNCTION PANEL
ALT: 17 U/L (ref 0–53)
AST: 19 U/L (ref 0–37)
Albumin: 4.4 g/dL (ref 3.5–5.2)
Alkaline Phosphatase: 61 U/L (ref 39–117)
Bilirubin, Direct: 0.2 mg/dL (ref 0.0–0.3)
Total Bilirubin: 1.2 mg/dL (ref 0.2–1.2)
Total Protein: 7.2 g/dL (ref 6.0–8.3)

## 2023-12-10 LAB — TROPONIN I (HIGH SENSITIVITY): High Sens Troponin I: 7 ng/L (ref 2–17)

## 2023-12-10 LAB — BRAIN NATRIURETIC PEPTIDE: Pro B Natriuretic peptide (BNP): 54 pg/mL (ref 0.0–100.0)

## 2023-12-10 LAB — TSH: TSH: 1.04 u[IU]/mL (ref 0.35–5.50)

## 2023-12-10 LAB — VITAMIN D 25 HYDROXY (VIT D DEFICIENCY, FRACTURES): VITD: 26.92 ng/mL — ABNORMAL LOW (ref 30.00–100.00)

## 2023-12-10 LAB — PHOSPHORUS: Phosphorus: 3.3 mg/dL (ref 2.3–4.6)

## 2023-12-10 MED ORDER — ROSUVASTATIN CALCIUM 5 MG PO TABS: 5.0000 mg | ORAL_TABLET | Freq: Every day | ORAL | 0 refills | Status: DC

## 2023-12-10 MED ORDER — TRAMADOL HCL 50 MG PO TABS: 50.0000 mg | ORAL_TABLET | Freq: Four times a day (QID) | ORAL | 0 refills | Status: DC | PRN

## 2023-12-10 NOTE — Patient Instructions (Signed)
 Hypertension, Adult High blood pressure (hypertension) is when the force of blood pumping through the arteries is too strong. The arteries are the blood vessels that carry blood from the heart throughout the body. Hypertension forces the heart to work harder to pump blood and may cause arteries to become narrow or stiff. Untreated or uncontrolled hypertension can lead to a heart attack, heart failure, a stroke, kidney disease, and other problems. A blood pressure reading consists of a higher number over a lower number. Ideally, your blood pressure should be below 120/80. The first ("top") number is called the systolic pressure. It is a measure of the pressure in your arteries as your heart beats. The second ("bottom") number is called the diastolic pressure. It is a measure of the pressure in your arteries as the heart relaxes. What are the causes? The exact cause of this condition is not known. There are some conditions that result in high blood pressure. What increases the risk? Certain factors may make you more likely to develop high blood pressure. Some of these risk factors are under your control, including: Smoking. Not getting enough exercise or physical activity. Being overweight. Having too much fat, sugar, calories, or salt (sodium) in your diet. Drinking too much alcohol. Other risk factors include: Having a personal history of heart disease, diabetes, high cholesterol, or kidney disease. Stress. Having a family history of high blood pressure and high cholesterol. Having obstructive sleep apnea. Age. The risk increases with age. What are the signs or symptoms? High blood pressure may not cause symptoms. Very high blood pressure (hypertensive crisis) may cause: Headache. Fast or irregular heartbeats (palpitations). Shortness of breath. Nosebleed. Nausea and vomiting. Vision changes. Severe chest pain, dizziness, and seizures. How is this diagnosed? This condition is diagnosed by  measuring your blood pressure while you are seated, with your arm resting on a flat surface, your legs uncrossed, and your feet flat on the floor. The cuff of the blood pressure monitor will be placed directly against the skin of your upper arm at the level of your heart. Blood pressure should be measured at least twice using the same arm. Certain conditions can cause a difference in blood pressure between your right and left arms. If you have a high blood pressure reading during one visit or you have normal blood pressure with other risk factors, you may be asked to: Return on a different day to have your blood pressure checked again. Monitor your blood pressure at home for 1 week or longer. If you are diagnosed with hypertension, you may have other blood or imaging tests to help your health care provider understand your overall risk for other conditions. How is this treated? This condition is treated by making healthy lifestyle changes, such as eating healthy foods, exercising more, and reducing your alcohol intake. You may be referred for counseling on a healthy diet and physical activity. Your health care provider may prescribe medicine if lifestyle changes are not enough to get your blood pressure under control and if: Your systolic blood pressure is above 130. Your diastolic blood pressure is above 80. Your personal target blood pressure may vary depending on your medical conditions, your age, and other factors. Follow these instructions at home: Eating and drinking  Eat a diet that is high in fiber and potassium, and low in sodium, added sugar, and fat. An example of this eating plan is called the DASH diet. DASH stands for Dietary Approaches to Stop Hypertension. To eat this way: Eat  plenty of fresh fruits and vegetables. Try to fill one half of your plate at each meal with fruits and vegetables. Eat whole grains, such as whole-wheat pasta, brown rice, or whole-grain bread. Fill about one  fourth of your plate with whole grains. Eat or drink low-fat dairy products, such as skim milk or low-fat yogurt. Avoid fatty cuts of meat, processed or cured meats, and poultry with skin. Fill about one fourth of your plate with lean proteins, such as fish, chicken without skin, beans, eggs, or tofu. Avoid pre-made and processed foods. These tend to be higher in sodium, added sugar, and fat. Reduce your daily sodium intake. Many people with hypertension should eat less than 1,500 mg of sodium a day. Do not drink alcohol if: Your health care provider tells you not to drink. You are pregnant, may be pregnant, or are planning to become pregnant. If you drink alcohol: Limit how much you have to: 0-1 drink a day for women. 0-2 drinks a day for men. Know how much alcohol is in your drink. In the [place] one drink equals one 12 oz bottle of beer (355 mL), one 5 oz glass of wine (148 mL), or one 1 oz glass of hard liquor (44 mL). Lifestyle  Work with your health care provider to maintain a healthy body weight or to lose weight. Ask what an ideal weight is for you. Get at least 30 minutes of exercise that causes your heart to beat faster (aerobic exercise) most days of the week. Activities may include walking, swimming, or biking. Include exercise to strengthen your muscles (resistance exercise), such as Pilates or lifting weights, as part of your weekly exercise routine. Try to do these types of exercises for 30 minutes at least 3 days a week. Do not use any products that contain nicotine or tobacco. These products include cigarettes, chewing tobacco, and vaping devices, such as e-cigarettes. If you need help quitting, ask your health care provider. Monitor your blood pressure at home as told by your health care provider. Keep all follow-up visits. This is important. Medicines Take over-the-counter and prescription medicines only as told by your health care provider. Follow directions carefully. Blood  pressure medicines must be taken as prescribed. Do not skip doses of blood pressure medicine. Doing this puts you at risk for problems and can make the medicine less effective. Ask your health care provider about side effects or reactions to medicines that you should watch for. Contact a health care provider if you: Think you are having a reaction to a medicine you are taking. Have headaches that keep coming back (recurring). Feel dizzy. Have swelling in your ankles. Have trouble with your vision. Get help right away if you: Develop a severe headache or confusion. Have unusual weakness or numbness. Feel faint. Have severe pain in your chest or abdomen. Vomit repeatedly. Have trouble breathing. These symptoms may be an emergency. Get help right away. Call 911. Do not wait to see if the symptoms will go away. Do not drive yourself to the hospital. Summary Hypertension is when the force of blood pumping through your arteries is too strong. If this condition is not controlled, it may put you at risk for serious complications. Your personal target blood pressure may vary depending on your medical conditions, your age, and other factors. For most people, a normal blood pressure is less than 120/80. Hypertension is treated with lifestyle changes, medicines, or a combination of both. Lifestyle changes include losing weight, eating a healthy,  low-sodium diet, exercising more, and limiting alcohol. This information is not intended to replace advice given to you by your health care provider. Make sure you discuss any questions you have with your health care provider. Document Revised: 07/04/2021 Document Reviewed: 07/04/2021 Elsevier Patient Education  2024 [employer].

## 2023-12-10 NOTE — Progress Notes (Unsigned)
 Subjective:  Patient ID: Henry [name], male    DOB: 07/07/1956  Age: 68 y.o. MRN: [id number]  CC: Hypertension   HPI Henry [name] presents for f/up ----  Discussed the use of AI scribe software for clinical note transcription with the patient, who gave verbal consent to proceed.  History of Present Illness   Henry [name] is a 68 year old male with myasthenia gravis who presents with shortness of breath on exertion.  He has experienced shortness of breath during physical activity for the past year, similar to previous asthma episodes. He has not been very active recently. No chest pain is associated with the shortness of breath. He has a history of asthma and hay fever, which he associates with these symptoms.  He has myasthenia gravis, which occasionally causes his left eye to close. He also experiences vertigo, describing episodes of wooziness and spinning, which led to a fall in the shower about five to six months ago. These vertigo episodes are sporadic and have not occurred for several months. No nausea accompanies these episodes.  He has a history of neuropathy, with his feet sometimes 'going to sleep' at night. No swelling in his legs or feet.  He is currently taking blood pressure medication, which he usually takes in the morning but took in the afternoon on the day of the visit. His blood pressure typically runs around 130/90 at home, although it was higher during the visit.  He has a history of irritable bowel syndrome and has undergone two colonoscopies in the past, which revealed small polyps. He experiences occasional diarrhea and constipation but denies any blood in the stool, attributing any bleeding to hemorrhoids.      According to RX refills he is not taking maintenance medications.  Outpatient Medications Prior to Visit  Medication Sig Dispense Refill   alendronate (FOSAMAX) 70 MG tablet Take 1 tablet (70 mg total) by mouth once a week. Take with a full glass of  water on Henry empty stomach. 12 tablet 3   methocarbamol (ROBAXIN) 500 MG tablet Take 1 tablet (500 mg total) by mouth every 6 (six) hours as needed for muscle spasms. 40 tablet 0   pyridostigmine (MESTINON) 60 MG tablet Take 1 tablet (60 mg total) by mouth 3 (three) times daily. 270 tablet 1   indapamide (LOZOL) 1.25 MG tablet Take 1 tablet (1.25 mg total) by mouth daily. 90 tablet 1   olmesartan (BENICAR) 20 MG tablet Take 1 tablet (20 mg total) by mouth daily. 90 tablet 1   rosuvastatin (CRESTOR) 5 MG tablet Take 1 tablet (5 mg total) by mouth daily. 90 tablet 1   traMADol (ULTRAM) 50 MG tablet Take 1 tablet (50 mg total) by mouth every 6 (six) hours as needed. 360 tablet 0   No facility-administered medications prior to visit.    ROS Review of Systems  Constitutional: Negative.  Negative for appetite change, chills, diaphoresis, fatigue and fever.  HENT: Negative.    Eyes:  Negative for visual disturbance.  Respiratory:  Positive for shortness of breath. Negative for cough, chest tightness and wheezing.   Cardiovascular:  Negative for chest pain, palpitations and leg swelling.  Gastrointestinal: Negative.  Negative for abdominal pain, constipation, diarrhea, nausea and vomiting.  Genitourinary: Negative.  Negative for difficulty urinating.  Musculoskeletal:  Positive for back pain. Negative for arthralgias, joint swelling and myalgias.  Skin: Negative.   Neurological: Negative.  Negative for dizziness and weakness.  Hematological:  Negative for adenopathy.  Does not bruise/bleed easily.  Psychiatric/Behavioral: Negative.      Objective:  BP (!) 144/94 (BP Location: Right Arm, Patient Position: Sitting, Cuff Size: Normal)   Pulse 73   Temp 98.2 F (36.8 C) (Oral)   Resp 16   Ht 5\' 10"  (1.778 m)   Wt 167 lb 3.2 oz (75.8 kg)   SpO2 96%   BMI 23.99 kg/m   BP Readings from Last 3 Encounters:  12/10/23 (!) 144/94  04/04/23 (!) 146/96  06/28/22 136/88    Wt Readings from Last 3  Encounters:  12/10/23 167 lb 3.2 oz (75.8 kg)  03/28/23 165 lb (74.8 kg)  06/28/22 163 lb (73.9 kg)    Physical Exam Vitals reviewed.  Constitutional:      General: He is not in acute distress.    Appearance: He is not ill-appearing, toxic-appearing or diaphoretic.  HENT:     Nose: Nose normal.     Mouth/Throat:     Mouth: Mucous membranes are moist.  Eyes:     General: No scleral icterus.    Conjunctiva/sclera: Conjunctivae normal.  Cardiovascular:     Rate and Rhythm: Normal rate and regular rhythm. Occasional Extrasystoles are present.    Heart sounds: Normal heart sounds, S1 normal and S2 normal. No murmur heard.    No friction rub. No gallop.     Comments: EKG- SR with SA and a PVC, 65 bpm No LVH, Q waves, or ST/T wave changes  Pulmonary:     Effort: Pulmonary effort is normal.     Breath sounds: No stridor. No wheezing, rhonchi or rales.  Abdominal:     General: Abdomen is flat.     Palpations: There is no mass.     Tenderness: There is no abdominal tenderness. There is no guarding.     Hernia: No hernia is present.  Musculoskeletal:     Cervical back: Neck supple.  Skin:    General: Skin is warm and dry.     Coloration: Skin is not pale.  Neurological:     General: No focal deficit present.     Mental Status: He is alert. Mental status is at baseline.  Psychiatric:        Mood and Affect: Mood normal.        Behavior: Behavior normal.     Lab Results  Component Value Date   WBC 8.1 12/10/2023   HGB 15.3 12/10/2023   HCT 43.7 12/10/2023   PLT 244.0 12/10/2023   GLUCOSE 101 (H) 12/10/2023   CHOL 160 03/28/2023   TRIG 131.0 03/28/2023   HDL 65.10 03/28/2023   LDLCALC 68 03/28/2023   ALT 17 12/10/2023   AST 19 12/10/2023   NA 140 12/10/2023   K 3.7 12/10/2023   CL 102 12/10/2023   CREATININE 0.93 12/10/2023   BUN 18 12/10/2023   CO2 31 12/10/2023   TSH 1.04 12/10/2023   PSA 1.09 03/28/2023   HGBA1C 5.0 04/13/2019    DG Bone Density Result  Date: 08/14/2022 EXAM: DUAL X-RAY ABSORPTIOMETRY (DXA) FOR BONE MINERAL DENSITY IMPRESSION: Referring Physician:  Orland Penman Your patient completed a bone mineral density test using GE Lunar iDXA system (analysis version: 16). Technologist: lmn PATIENT: Name: Jathniel, Smeltzer Patient ID: [id number] Birth Date: 01-Aug-1956 Height: 68.2 in. Sex: Male Measured: 08/14/2022 Weight: 165.6 lbs. Indications: Alcohol (3 or more units per day), Height Loss (781.91), scoliosis Fractures: NONE Treatments: Calcium (E943.0), Fosamax, Vitamin D (E933.5) ASSESSMENT: The BMD measured at AP  Spine L1-L2 is 0.909 g/cm2 with a T-score of -2.1. This patient is considered osteopenic/low bone mass according to World Health Organization [facility]) criteria. The quality of the exam is good. L3 and L4 were excluded due to degenerative changes noted on previous exam. FRAX was not calculated because of current treatment with alendronate. Site Region Measured Date Measured Age YA BMD Significant CHANGE T-score AP Spine  L1-L2      6/14/2020    68.7         -2.1    0.909 g/cm2 AP Spine  L1-L2      02/26/2019    65.2         -2.5    0.865 g/cm2 DualFemur Neck Right 08/14/2022    68.7         -1.6    0.817 g/cm2 DualFemur Neck Right 02/26/2019    65.2         -1.8    0.786 g/cm2 DualFemur Total Mean 08/14/2022    68.7         -1.3    0.848 g/cm2 DualFemur Total Mean 02/26/2019    65.2         -1.4    0.836 g/cm2 World Health Organization [facility]) criteria for post-menopausal, Caucasian Women: Normal       T-score at or above -1 SD Osteopenia   T-score between -1 and -2.5 SD Osteoporosis T-score at or below -2.5 SD RECOMMENDATION: National Osteoporosis Foundation recommends that FDA-approved medical therapies be considered in postmenopausal women and men age 65 or older with a: 1. Hip or vertebral (clinical or morphometric) fracture. 2. T-score of less than or equal to -2.5 at the spine or hip. 3. Ten-year fracture probability by FRAX of 3% or  greater for hip fracture or 20% or greater for major osteoporotic fracture. All treatment decisions require clinical judgment and consideration of individual patient factors, including patient preferences, co-morbidities, previous drug use, risk factors not captured in the FRAX model (e.g. falls, vitamin D deficiency, increased bone turnover, interval significant decline in bone density) and possible under- or over-estimation of fracture risk by FRAX. All patients should ensure Henry adequate intake of dietary calcium (1200 mg/d) and vitamin D (800 IU daily) unless contraindicated. FOLLOW-UP: People with diagnosed cases of osteoporosis or osteopenia should be regularly tested for bone mineral density. For patients eligible for Medicare, routine testing is allowed once every 2 years. The testing frequency can be increased to one year for patients who have rapidly progressing disease, or for those who are receiving medical therapy to restore bone mass. I have reviewed this study and agree with the findings. Mark A. Tyron Russell, M.D. [facility] Electronically Signed   By: Ulyses Southward M.D.   On: 08/14/2022 14:58    Assessment & Plan:  Primary hypertension -     Basic metabolic panel with GFR; Future -     CBC with Differential/Platelet; Future -     Urinalysis, Routine w reflex microscopic; Future -     EKG 12-Lead -     TSH; Future -     Olmesartan Medoxomil; Take 1 tablet (20 mg total) by mouth daily.  Dispense: 90 tablet; Refill: 0 -     Indapamide; Take 1 tablet (1.25 mg total) by mouth daily.  Dispense: 90 tablet; Refill: 0 -     AMB Referral VBCI Care Management  DOE (dyspnea on exertion) -     Troponin I (High Sensitivity); Future -  Brain natriuretic peptide; Future -     EKG 12-Lead -     Ambulatory referral to Cardiology  Osteoporosis without current pathological fracture, unspecified osteoporosis type -     Phosphorus; Future -     VITAMIN D 25 Hydroxy (Vit-D Deficiency,  Fractures); Future -     Vitamin D3; Take 1 capsule (2,000 Units total) by mouth daily.  Dispense: 90 capsule; Refill: 1  Vitamin D insufficiency -     Phosphorus; Future -     VITAMIN D 25 Hydroxy (Vit-D Deficiency, Fractures); Future -     Vitamin D3; Take 1 capsule (2,000 Units total) by mouth daily.  Dispense: 90 capsule; Refill: 1  Hyperlipidemia LDL goal <130 -     Hepatic function panel; Future -     Rosuvastatin Calcium; Take 1 tablet (5 mg total) by mouth daily.  Dispense: 90 tablet; Refill: 0  Left lumbar radiculitis -     traMADol HCl; Take 1 tablet (50 mg total) by mouth every 6 (six) hours as needed.  Dispense: 360 tablet; Refill: 0  Spinal stenosis in cervical region -     traMADol HCl; Take 1 tablet (50 mg total) by mouth every 6 (six) hours as needed.  Dispense: 360 tablet; Refill: 0  Asymptomatic PVCs -     Ambulatory referral to Cardiology     Follow-up: Return in about 3 months (around 03/10/2024).  Sanda Linger, MD

## 2023-12-11 ENCOUNTER — Encounter: Payer: Self-pay | Admitting: Internal Medicine

## 2023-12-11 DIAGNOSIS — I493 Ventricular premature depolarization: Secondary | ICD-10-CM | POA: Insufficient documentation

## 2023-12-11 LAB — URINALYSIS, ROUTINE W REFLEX MICROSCOPIC
Bilirubin Urine: NEGATIVE
Hgb urine dipstick: NEGATIVE
Ketones, ur: NEGATIVE
Leukocytes,Ua: NEGATIVE
Nitrite: NEGATIVE
RBC / HPF: NONE SEEN (ref 0–?)
Specific Gravity, Urine: 1.025 (ref 1.000–1.030)
Total Protein, Urine: NEGATIVE
Urine Glucose: NEGATIVE
Urobilinogen, UA: 0.2 (ref 0.0–1.0)
pH: 6.5 (ref 5.0–8.0)

## 2023-12-11 MED ORDER — OLMESARTAN MEDOXOMIL 20 MG PO TABS
20.0000 mg | ORAL_TABLET | Freq: Every day | ORAL | 0 refills | Status: DC
Start: 2023-12-11 — End: 2024-03-16

## 2023-12-11 MED ORDER — INDAPAMIDE 1.25 MG PO TABS: 1.2500 mg | ORAL_TABLET | Freq: Every day | ORAL | 0 refills | Status: DC

## 2023-12-11 MED ORDER — VITAMIN D3 50 MCG (2000 UT) PO CAPS: 2000.0000 [IU] | ORAL_CAPSULE | Freq: Every day | ORAL | 1 refills | Status: AC

## 2023-12-12 ENCOUNTER — Telehealth: Payer: Self-pay | Admitting: *Deleted

## 2023-12-12 NOTE — Progress Notes (Signed)
 Care Guide Pharmacy Note  12/12/2023 Name: [name] MRN: [id number] DOB: 08-23-1956  Referred By: Etta Grandchild, MD Reason for referral: Care Coordination (Outreach to schedule referral with pharmacist )   Henry [name] is a 68 y.o. year old male who is a primary care patient of Etta Grandchild, MD.  Owens Shark was referred to the pharmacist for assistance related to: HTN  Successful contact was made with the patient to discuss pharmacy services including being ready for the pharmacist to call at least 5 minutes before the scheduled appointment time and to have medication bottles and any blood pressure readings ready for review. The patient agreed to meet with the pharmacist via telephone visit on 12/30/2023  Burman Nieves, CMA [place]  [facility], [facility] Guide Direct Dial: [phone]  Fax: [phone] Website: [email or web address]

## 2023-12-30 ENCOUNTER — Other Ambulatory Visit: Admitting: Pharmacist

## 2023-12-30 DIAGNOSIS — I10 Essential (primary) hypertension: Secondary | ICD-10-CM

## 2023-12-30 NOTE — Patient Instructions (Signed)
 It was a pleasure speaking with you today!  Continue indapamide  and olmesartan . Check blood pressure at home regularly and notify us  if you are getting blood pressures above 130/80 often.  Feel free to call with any questions or concerns!  Rainelle Bur, PharmD, BCPS, CPP Clinical Pharmacist Practitioner [facility] [phone]

## 2023-12-30 NOTE — Progress Notes (Signed)
 12/30/2023 Name: Henry [name] MRN: [id number] DOB: 02/19/1956  Chief Complaint  Patient presents with   Hypertension   Medication Management    Henry [name] is a 68 y.o. year old male who presented for a telephone visit.   They were referred to the pharmacist by their PCP for assistance in managing hypertension.     Subjective:  Care Team: Primary Care Provider: Arcadio Knuckles, MD ; Next Scheduled Visit: none scheduled  Medication Access/Adherence  Current Pharmacy:  [id number] [place], [place] - [address] AT [facility] [address] [place] [address] Phone: [phone] Fax: [phone]  [id number] - [place], [place] - [address] AT [facility] [address] [place] [address] Phone: [phone] Fax: [phone]   Patient reports affordability concerns with their medications: No  Patient reports access/transportation concerns to their pharmacy: No  Patient reports adherence concerns with their medications:  No     Hypertension:  Current medications: indapamide  1.25 mg daily, olmesartan  20 mg daily Medications previously tried: lisinopril   Patient has a validated, automated, upper arm home BP cuff Current blood pressure readings: ~122/80    Objective:  Lab Results  Component Value Date   HGBA1C 5.0 04/13/2019    Lab Results  Component Value Date   CREATININE 0.93 12/10/2023   BUN 18 12/10/2023   NA 140 12/10/2023   K 3.7 12/10/2023   CL 102 12/10/2023   CO2 31 12/10/2023    Lab Results  Component Value Date   CHOL 160 03/28/2023   HDL 65.10 03/28/2023   LDLCALC 68 03/28/2023   TRIG 131.0 03/28/2023   CHOLHDL 2 03/28/2023    Medications Reviewed Today     Reviewed by Dion Frankel, RPH (Pharmacist) on 12/30/23 at 1313  Med List Status: <None>   Medication Order Taking? Sig Documenting Provider Last Dose Status Informant  alendronate  (FOSAMAX )  70 MG tablet [id number] No Take 1 tablet (70 mg total) by mouth once a week. Take with a full glass of water on an empty stomach.  Patient not taking: Reported on 12/30/2023   Shamleffer, Julian Obey, MD Not Taking Active   Cholecalciferol (VITAMIN D3) 50 MCG (2000 UT) capsule [id number] Yes Take 1 capsule (2,000 Units total) by mouth daily. Arcadio Knuckles, MD Taking Active   indapamide  (LOZOL ) 1.25 MG tablet [id number] Yes Take 1 tablet (1.25 mg total) by mouth daily. Arcadio Knuckles, MD Taking Active   methocarbamol  (ROBAXIN ) 500 MG tablet [id number]  Take 1 tablet (500 mg total) by mouth every 6 (six) hours as needed for muscle spasms. Colene Dauphin, MD  Active   olmesartan  (BENICAR ) 20 MG tablet [id number] Yes Take 1 tablet (20 mg total) by mouth daily. Arcadio Knuckles, MD Taking Active   pyridostigmine  (MESTINON ) 60 MG tablet [id number]  Take 1 tablet (60 mg total) by mouth 3 (three) times daily. Sater, Sherida Dimmer, MD  Active   rosuvastatin  (CRESTOR ) 5 MG tablet [id number] Yes Take 1 tablet (5 mg total) by mouth daily. Arcadio Knuckles, MD Taking Active   traMADol  (ULTRAM ) 50 MG tablet [id number] Yes Take 1 tablet (50 mg total) by mouth every 6 (six) hours as needed. Arcadio Knuckles, MD Taking Active               Assessment/Plan:   Hypertension: - Currently controlled per patient report, BP goal <130/80 - Reviewed long term cardiovascular and renal  outcomes of uncontrolled blood pressure - Reviewed appropriate blood pressure monitoring technique and reviewed goal blood pressure. Recommended to check home blood pressure and heart rate  - Recommend to continue indapamide  and olmesartan . Notify us  if BP >130/80 often     Follow Up Plan: PRN  Rainelle Bur, PharmD, BCPS, CPP Clinical Pharmacist Practitioner [facility] at [facility] [phone]

## 2024-01-07 ENCOUNTER — Encounter: Payer: Self-pay | Admitting: Cardiology

## 2024-01-07 ENCOUNTER — Ambulatory Visit: Attending: Cardiology | Admitting: Cardiology

## 2024-01-07 VITALS — BP 126/68 | HR 90 | Ht 70.0 in | Wt 170.2 lb

## 2024-01-07 DIAGNOSIS — I2510 Atherosclerotic heart disease of native coronary artery without angina pectoris: Secondary | ICD-10-CM | POA: Insufficient documentation

## 2024-01-07 DIAGNOSIS — I2089 Other forms of angina pectoris: Secondary | ICD-10-CM | POA: Diagnosis present

## 2024-01-07 DIAGNOSIS — I10 Essential (primary) hypertension: Secondary | ICD-10-CM | POA: Diagnosis present

## 2024-01-07 DIAGNOSIS — I25118 Atherosclerotic heart disease of native coronary artery with other forms of angina pectoris: Secondary | ICD-10-CM

## 2024-01-07 DIAGNOSIS — R0609 Other forms of dyspnea: Secondary | ICD-10-CM | POA: Insufficient documentation

## 2024-01-07 DIAGNOSIS — I77810 Thoracic aortic ectasia: Secondary | ICD-10-CM | POA: Insufficient documentation

## 2024-01-07 DIAGNOSIS — I351 Nonrheumatic aortic (valve) insufficiency: Secondary | ICD-10-CM | POA: Diagnosis not present

## 2024-01-07 DIAGNOSIS — I2584 Coronary atherosclerosis due to calcified coronary lesion: Secondary | ICD-10-CM | POA: Insufficient documentation

## 2024-01-07 NOTE — Progress Notes (Signed)
 Cardiology Office Note:    Date:  01/07/2024  NAME:  Henry [name]    MRN: [id number] DOB:  Jun 20, 1956   PCP:  Arcadio Knuckles, MD  Former Cardiology Providers: Dr. Ardell Beauvais, Dr. Felipe Horton Primary Cardiologist:  Olinda Bertrand, DO, [facility] (established care 01/07/2024) Electrophysiologist:  None   Referring MD: Arcadio Knuckles, MD  Reason of Consult: Dyspnea on exertion and PVCs  Chief Complaint  Patient presents with   New Patient (Initial Visit)   Shortness of Breath    On exertion.    History of Present Illness:    Henry [name] is a 68 y.o. Caucasian male whose past medical history and cardiovascular risk factors includes: Hypertension, myasthenia gravis, coronary artery calcification, basal cell carcinoma, aortic regurgitation.   Formally under the care of Dr. Ardell Beauvais who last saw Henry [name] back in July 2023. I am seeing him for the first time to re-establishing care.   After the diagnosis of myasthenia gravis patient underwent CT of the chest to assess for thymoma and he was noted to have aortic valve and coronary calcifications.  At that time clinically patient was asymptomatic.  Later he was experiencing shortness of breath with effort related activities underwent a Myoview  which noted mild perfusion defect in the apical inferior and apex at that time and was felt to secondary to artifact.  Follow-up transthoracic echocardiogram in July 2023 noted normal biventricular function, grade 1 diastolic dysfunction, mild to moderate aortic regurgitation, mild MR, aortic root 41 mm and ascending aorta 39 mm.  Patient is referred back to the practice by PCP for concerns for progressive dyspnea on exertion and premature ventricular contractions.  Dyspnea on exertion: Chronic, progressive. More noticeable with day-to-day activities. Patient states that he could be having worsening shortness of breath with activity such as taking the garbage to the curb. No exertional chest  pain. Mostly with effort related activities, improves with resting.  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  Current Medications: Current Meds  Medication Sig   alendronate  (FOSAMAX ) 70 MG tablet Take 1 tablet (70 mg total) by mouth once a week. Take with a full glass of water on an empty stomach.   Cholecalciferol (VITAMIN D3) 50 MCG (2000 UT) capsule Take 1 capsule (2,000 Units total) by mouth daily.   indapamide  (LOZOL ) 1.25 MG tablet Take 1 tablet (1.25 mg total) by mouth daily.   methocarbamol  (ROBAXIN ) 500 MG tablet Take 1 tablet (500 mg total) by mouth every 6 (six) hours as needed for muscle spasms.   olmesartan  (BENICAR ) 20 MG tablet Take 1 tablet (20 mg total) by mouth daily.   pyridostigmine  (MESTINON ) 60 MG tablet Take 1 tablet (60 mg total) by mouth 3 (three) times daily.   rosuvastatin  (CRESTOR ) 5 MG tablet Take 1 tablet (5 mg total) by mouth daily.   traMADol  (ULTRAM ) 50 MG tablet Take 1 tablet (50 mg total) by mouth every 6 (six) hours as needed.     Allergies:    Valacyclovir    Past Medical History: Past Medical History:  Diagnosis Date   Allergy    Anemia    Arthritis    Basal cell carcinoma 02/22/2021   nod-left anterior mandible (CX35FU + exc)   Depression    due to long tern hepatitis C - once the Hep C was cured, had  not any issues until last year 01-28-2018) when is wife died   History of hepatitis C    has been treated in 01/29/2004   Hypertension    Pneumonia    as a teenager   Scoliosis     Past Surgical History: Past Surgical History:  Procedure Laterality Date   ANTERIOR CERVICAL DECOMP/DISCECTOMY FUSION N/A 04/13/2019   Procedure: ANTERIOR CERVICAL DECOMPRESSION/DISCECTOMY FUSION CERVICAL FOUR-FIVE, CERVICAL FIVE-SIX, CERVICAL SIX-SEVEN;  Surgeon: Gearl Keens, MD;  Location: [facility] OR;  Service: Neurosurgery;  Laterality:  N/A;   COLONOSCOPY     RADIOLOGY WITH ANESTHESIA N/A 03/31/2019   Procedure: MRI WITH ANESTHESIA LUMBAR WITHOUT CONTRAST AND CERVICAL WITHOUT CONTRAST;  Surgeon: Radiologist, Medication, MD;  Location: [facility] OR;  Service: Radiology;  Laterality: N/A;   TONSILLECTOMY AND ADENOIDECTOMY      Social History: Social History   Tobacco Use   Smoking status: Former    Types: Cigarettes   Smokeless tobacco: Never   Tobacco comments:    light smoker when I smoked  Vaping Use   Vaping status: Never Used  Substance Use Topics   Alcohol use: Yes    Alcohol/week: 35.0 standard drinks of alcohol    Types: 21 Cans of beer, 14 Standard drinks or equivalent per week    Comment: 2-3 drinks per day   Drug use: Yes    Types: Marijuana    Comment: every once in a while per pt    Family History: Family History  Problem Relation Age of Onset   Hypertension Mother    Hypertension Father    Alcoholism Brother     ROS:   Review of Systems  Cardiovascular:  Positive for dyspnea on exertion (more than baseline). Negative for chest pain, claudication, irregular heartbeat, leg swelling, near-syncope, orthopnea, palpitations, paroxysmal nocturnal dyspnea and syncope.  Respiratory:  Negative for shortness of breath.   Hematologic/Lymphatic: Negative for bleeding problem.    EKGs/Labs/Other Studies Reviewed:   EKG: EKG Interpretation Date/Time:  Tuesday January 07 2024 14:55:10 EDT Ventricular Rate:  95 PR Interval:  152 QRS Duration:  88 QT Interval:  392 QTC Calculation: 492 R Axis:   -4  Text Interpretation: Normal sinus rhythm ST & T wave abnormality, consider inferolateral ischemia Prolonged QT No previous ECGs available Confirmed by Olinda Bertrand [id number]) on 01/07/2024 3:02:52 PM  Echocardiogram: 03/2022: 60-65%, grade 1 diastolic dysfunction, mild MR, mild to moderate AR, aortic root 41 mm, ascending aorta 39 mm, aortic arch 41 mm.  03/2023: LVEF 60 to 65%, grade 1 diastolic dysfunction, right  ventricular size and function normal, mild to moderate LAE, mild MR, moderate AR, aortic root 42 mm, ascending aorta 41 mm, estimated RAP 3 mmHg  Stress Testing:  Myoview  02/2022:   Findings are consistent with no prior ischemia. The study is low risk.   No ST deviation was noted.   There is a small defect with mild reduction in uptake present in the apical inferior and apex location(s) that is fixed. There is normal wall motion in the defect area. Consistent with artifact caused by subdiaphragmatic activity.   Left ventricular function is abnormal. Nuclear EF 49%, however, visually appears normal. Recommend TTE for further evaluation. End diastolic cavity size is normal.   Prior study not available for comparison.  RADIOLOGY: CT chest with contrast March 2022: 1. No evidence of a thymoma. 2. Large hiatal hernia. 3. Coronary artery calcification.  Labs:    Latest Ref Rng & Units 12/10/2023    4:33 PM  03/28/2023    8:49 AM 01/25/2022    3:39 PM  CBC  WBC 4.0 - 10.5 K/uL 8.1  5.9  7.4   Hemoglobin 13.0 - 17.0 g/dL 53.6  64.4  03.4   Hematocrit 39.0 - 52.0 % 43.7  48.1  42.6   Platelets 150.0 - 400.0 K/uL 244.0  283.0  265.0        Latest Ref Rng & Units 12/10/2023    4:33 PM 03/28/2023    8:49 AM 01/25/2022    3:39 PM  BMP  Glucose 70 - 99 mg/dL 742  595  85   BUN 6 - 23 mg/dL 18  17  22    Creatinine 0.40 - 1.50 mg/dL 6.38  7.56  4.33   Sodium 135 - 145 mEq/L 140  141  139   Potassium 3.5 - 5.1 mEq/L 3.7  4.4  4.3   Chloride 96 - 112 mEq/L 102  103  104   CO2 19 - 32 mEq/L 31  29  29    Calcium  8.4 - 10.5 mg/dL 9.7  29.5  9.4       Latest Ref Rng & Units 12/10/2023    4:33 PM 03/28/2023    8:49 AM 01/25/2022    3:39 PM  CMP  Glucose 70 - 99 mg/dL 188  416  85   BUN 6 - 23 mg/dL 18  17  22    Creatinine 0.40 - 1.50 mg/dL 6.06  3.01  6.01   Sodium 135 - 145 mEq/L 140  141  139   Potassium 3.5 - 5.1 mEq/L 3.7  4.4  4.3   Chloride 96 - 112 mEq/L 102  103  104   CO2 19 - 32 mEq/L  31  29  29    Calcium  8.4 - 10.5 mg/dL 9.7  09.3  9.4   Total Protein 6.0 - 8.3 g/dL 7.2  7.4  6.9   Total Bilirubin 0.2 - 1.2 mg/dL 1.2  1.3  1.0   Alkaline Phos 39 - 117 U/L 61  66  75   AST 0 - 37 U/L 19  19  21    ALT 0 - 53 U/L 17  13  14      Lab Results  Component Value Date   CHOL 160 03/28/2023   HDL 65.10 03/28/2023   LDLCALC 68 03/28/2023   TRIG 131.0 03/28/2023   CHOLHDL 2 03/28/2023   Recent Labs    03/28/23 0849  LIPOA 176*   No components found for: "NTPROBNP" Recent Labs    12/10/23 1633  PROBNP 54.0   Recent Labs    03/28/23 0849 12/10/23 1633  TSH 1.31 1.04    Physical Exam:    Today's Vitals   01/07/24 1453  BP: 126/68  Pulse: 90  SpO2: 94%  Weight: 170 lb 3.2 oz (77.2 kg)  Height: 5\' 10"  (1.778 m)   Body mass index is 24.42 kg/m. Wt Readings from Last 3 Encounters:  01/07/24 170 lb 3.2 oz (77.2 kg)  12/10/23 167 lb 3.2 oz (75.8 kg)  03/28/23 165 lb (74.8 kg)    Physical Exam  Constitutional: No distress.  hemodynamically stable  Neck: No JVD present.  Cardiovascular: Normal rate, regular rhythm, S1 normal and S2 normal. Exam reveals no gallop, no S3 and no S4.  Murmur heard. Decrescendo early diastolic murmur is present with a grade of 3/4 at the upper right sternal border radiating to the apex. Pulmonary/Chest: Effort normal and breath sounds normal. No stridor.  He has no wheezes. He has no rales.  Right-sided scoliosis.  Abdominal: Soft. He exhibits no distension. There is no abdominal tenderness.  Musculoskeletal:        General: No edema.     Cervical back: Neck supple.  Skin: Skin is warm.     Impression & Recommendation(s):  Impression:   ICD-10-CM   1. Anginal equivalent (HCC)  I20.89 ECHOCARDIOGRAM COMPLETE    Hemoglobin and hematocrit, blood    Hemoglobin and hematocrit, blood    2. Coronary atherosclerosis due to calcified coronary lesion  I25.10 Lipid panel   I25.84 Comprehensive metabolic panel with GFR     Comprehensive metabolic panel with GFR    Lipid panel    Lipid panel    3. Dyspnea on exertion  R06.09 EKG 12-Lead    Hemoglobin and hematocrit, blood    Hemoglobin and hematocrit, blood    Hemoglobin and hematocrit, blood    Comprehensive metabolic panel with GFR    aspirin chewable tablet 81 mg    0.9% sodium chloride  infusion    0.9% sodium chloride  infusion    4. Nonrheumatic aortic valve insufficiency  I35.1 ECHOCARDIOGRAM COMPLETE    Hemoglobin and hematocrit, blood    Hemoglobin and hematocrit, blood    5. Aortic root dilation (HCC)  I77.810 ECHOCARDIOGRAM COMPLETE    6. Essential hypertension  I10        Recommendation(s):  Anginal equivalent (HCC) Dyspnea on exertion Coronary atherosclerosis due to calcified coronary lesion Patient has had a long history of dyspnea on exertion but recently has been getting progressively worse.  Activities such as taking the garbage out to the curb causes him to be significantly short of breath.  EKG illustrates sinus rhythm with ST-T changes in the inferolateral leads.  He had a CT chest back in March 2022 which notes multivessel CAC. No active discomfort. I am concerned that his dyspnea on exertion could be his anginal equivalents. I have asked him to avoid over exertional activities for now until the workup is complete Echo will be ordered to evaluate for structural heart disease and left ventricular systolic function. Patient needs an ischemic evaluation.  We discussed modalities which include but not limited to nuclear stress test, cardiac PET/CT, coronary CTA, and left heart catheterization with possible intervention.  Risks, benefits, alternatives, and limitations of each modality discussed.  I was in the favor of either proceeding forward with either cardiac PET/CT or left heart catheterization with possible intervention.  The degree of coronary calcification may make the diagnostic yield of a coronary CTA less likely due to blooming  artifact.  Patient participated in shared decision making and wants to proceed with left heart catheterization with possible intervention. 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 If significant change compared to prior study may require CT of the chest for verification and correlation. Will hold off on the CT of the chest for now. Office blood pressures are well-controlled.   Continue olmesartan  20 mg p.o. daily   Essential hypertension Office and home blood pressures are very well-controlled Continue Benicar  20 mg p.o. daily. Continue indapamide  1.25 mg p.o. daily  Hyperlipidemia: Most recent lipid profile from July 2024 illustrates an LDL level of 68 mg/dL, independently reviewed via [facility] database Continue Crestor  5 mg p.o. daily. May need to uptitrate medical therapy based on follow-up  lipids.   Orders Placed:  Orders Placed This Encounter  Procedures   Lipid panel    Standing Status:   Future    Number of Occurrences:   1    Expected Date:   01/08/2024    Expiration Date:   01/06/2025   Comprehensive metabolic panel with GFR    Standing Status:   Future    Number of Occurrences:   1    Expected Date:   01/08/2024    Expiration Date:   01/06/2025   Hemoglobin and hematocrit, blood    Standing Status:   Future    Number of Occurrences:   1    Expected Date:   01/08/2024    Expiration Date:   01/06/2025   Hemoglobin and hematocrit, blood   Comprehensive metabolic panel with GFR   Lipid panel   EKG 12-Lead   ECHOCARDIOGRAM COMPLETE    Standing Status:   Future    Expected Date:   01/14/2024    Expiration Date:   01/06/2025    Where should this test be performed:   [facility]    Does the patient weigh less than or greater than 250 lbs?:   Patient weighs less than 250 lbs     Perflutren DEFINITY (image enhancing agent) should be administered unless hypersensitivity or allergy exist:   Administer Perflutren    Reason for exam-Echo:   Other-Full Diagnosis List    Full ICD-10/Reason for Exam:   Aortic root dilatation (HCC) [322010]    Full ICD-10/Reason for Exam:   Ascending aorta dilatation (HCC) [341201]    Full ICD-10/Reason for Exam:   Aortic insufficiency [098119]     Final Medication List:   No orders of the defined types were placed in this encounter.   There are no discontinued medications.   Current Outpatient Medications:    alendronate  (FOSAMAX ) 70 MG tablet, Take 1 tablet (70 mg total) by mouth once a week. Take with a full glass of water on an empty stomach., Disp: 12 tablet, Rfl: 3   Cholecalciferol (VITAMIN D3) 50 MCG (2000 UT) capsule, Take 1 capsule (2,000 Units total) by mouth daily., Disp: 90 capsule, Rfl: 1   indapamide  (LOZOL ) 1.25 MG tablet, Take 1 tablet (1.25 mg total) by mouth daily., Disp: 90 tablet, Rfl: 0   methocarbamol  (ROBAXIN ) 500 MG tablet, Take 1 tablet (500 mg total) by mouth every 6 (six) hours as needed for muscle spasms., Disp: 40 tablet, Rfl: 0   olmesartan  (BENICAR ) 20 MG tablet, Take 1 tablet (20 mg total) by mouth daily., Disp: 90 tablet, Rfl: 0   pyridostigmine  (MESTINON ) 60 MG tablet, Take 1 tablet (60 mg total) by mouth 3 (three) times daily., Disp: 270 tablet, Rfl: 1   rosuvastatin  (CRESTOR ) 5 MG tablet, Take 1 tablet (5 mg total) by mouth daily., Disp: 90 tablet, Rfl: 0   traMADol  (ULTRAM ) 50 MG tablet, Take 1 tablet (50 mg total) by mouth every 6 (six) hours as needed., Disp: 360 tablet, Rfl: 0  Consent:   Informed Consent   Shared Decision Making/Informed Consent The risks [stroke (1 in 1000), death (1 in 1000), kidney failure [usually temporary] (1 in 500), bleeding (1 in 200), allergic reaction [possibly serious] (1 in 200)], benefits (diagnostic support and management of coronary artery disease) and  alternatives of a cardiac catheterization were discussed in detail with Mr. Lewellyn and he is willing to proceed.     Disposition:   10 weeks or sooner if  needed.  Patient may be asked to follow-up sooner based on the results of the above-mentioned testing.  His questions and concerns were addressed to his satisfaction. He voices understanding of the recommendations provided during this encounter.    Signed, Awilda Bogus [facility]  [facility]  01/07/2024 5:55 PM

## 2024-01-07 NOTE — H&P (View-Only) (Signed)
 Cardiology Office Note:    Date:  01/07/2024  NAME:  Henry [name]    MRN: [id number] DOB:  Jun 20, 1956   PCP:  Arcadio Knuckles, MD  Former Cardiology Providers: Dr. Ardell Beauvais, Dr. Felipe Horton Primary Cardiologist:  Olinda Bertrand, DO, [facility] (established care 01/07/2024) Electrophysiologist:  None   Referring MD: Arcadio Knuckles, MD  Reason of Consult: Dyspnea on exertion and PVCs  Chief Complaint  Patient presents with   New Patient (Initial Visit)   Shortness of Breath    On exertion.    History of Present Illness:    Henry [name] is a 68 y.o. Caucasian male whose past medical history and cardiovascular risk factors includes: Hypertension, myasthenia gravis, coronary artery calcification, basal cell carcinoma, aortic regurgitation.   Formally under the care of Dr. Ardell Beauvais who last saw Henry [name] back in July 2023. I am seeing him for the first time to re-establishing care.   After the diagnosis of myasthenia gravis patient underwent CT of the chest to assess for thymoma and he was noted to have aortic valve and coronary calcifications.  At that time clinically patient was asymptomatic.  Later he was experiencing shortness of breath with effort related activities underwent a Myoview  which noted mild perfusion defect in the apical inferior and apex at that time and was felt to secondary to artifact.  Follow-up transthoracic echocardiogram in July 2023 noted normal biventricular function, grade 1 diastolic dysfunction, mild to moderate aortic regurgitation, mild MR, aortic root 41 mm and ascending aorta 39 mm.  Patient is referred back to the practice by PCP for concerns for progressive dyspnea on exertion and premature ventricular contractions.  Dyspnea on exertion: Chronic, progressive. More noticeable with day-to-day activities. Patient states that he could be having worsening shortness of breath with activity such as taking the garbage to the curb. No exertional chest  pain. Mostly with effort related activities, improves with resting.  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  Current Medications: Current Meds  Medication Sig   alendronate  (FOSAMAX ) 70 MG tablet Take 1 tablet (70 mg total) by mouth once a week. Take with a full glass of water on an empty stomach.   Cholecalciferol (VITAMIN D3) 50 MCG (2000 UT) capsule Take 1 capsule (2,000 Units total) by mouth daily.   indapamide  (LOZOL ) 1.25 MG tablet Take 1 tablet (1.25 mg total) by mouth daily.   methocarbamol  (ROBAXIN ) 500 MG tablet Take 1 tablet (500 mg total) by mouth every 6 (six) hours as needed for muscle spasms.   olmesartan  (BENICAR ) 20 MG tablet Take 1 tablet (20 mg total) by mouth daily.   pyridostigmine  (MESTINON ) 60 MG tablet Take 1 tablet (60 mg total) by mouth 3 (three) times daily.   rosuvastatin  (CRESTOR ) 5 MG tablet Take 1 tablet (5 mg total) by mouth daily.   traMADol  (ULTRAM ) 50 MG tablet Take 1 tablet (50 mg total) by mouth every 6 (six) hours as needed.     Allergies:    Valacyclovir    Past Medical History: Past Medical History:  Diagnosis Date   Allergy    Anemia    Arthritis    Basal cell carcinoma 02/22/2021   nod-left anterior mandible (CX35FU + exc)   Depression    due to long tern hepatitis C - once the Hep C was cured, had  not any issues until last year 01-28-2018) when is wife died   History of hepatitis C    has been treated in 01/29/2004   Hypertension    Pneumonia    as a teenager   Scoliosis     Past Surgical History: Past Surgical History:  Procedure Laterality Date   ANTERIOR CERVICAL DECOMP/DISCECTOMY FUSION N/A 04/13/2019   Procedure: ANTERIOR CERVICAL DECOMPRESSION/DISCECTOMY FUSION CERVICAL FOUR-FIVE, CERVICAL FIVE-SIX, CERVICAL SIX-SEVEN;  Surgeon: Gearl Keens, MD;  Location: [facility] OR;  Service: Neurosurgery;  Laterality:  N/A;   COLONOSCOPY     RADIOLOGY WITH ANESTHESIA N/A 03/31/2019   Procedure: MRI WITH ANESTHESIA LUMBAR WITHOUT CONTRAST AND CERVICAL WITHOUT CONTRAST;  Surgeon: Radiologist, Medication, MD;  Location: [facility] OR;  Service: Radiology;  Laterality: N/A;   TONSILLECTOMY AND ADENOIDECTOMY      Social History: Social History   Tobacco Use   Smoking status: Former    Types: Cigarettes   Smokeless tobacco: Never   Tobacco comments:    light smoker when I smoked  Vaping Use   Vaping status: Never Used  Substance Use Topics   Alcohol use: Yes    Alcohol/week: 35.0 standard drinks of alcohol    Types: 21 Cans of beer, 14 Standard drinks or equivalent per week    Comment: 2-3 drinks per day   Drug use: Yes    Types: Marijuana    Comment: every once in a while per pt    Family History: Family History  Problem Relation Age of Onset   Hypertension Mother    Hypertension Father    Alcoholism Brother     ROS:   Review of Systems  Cardiovascular:  Positive for dyspnea on exertion (more than baseline). Negative for chest pain, claudication, irregular heartbeat, leg swelling, near-syncope, orthopnea, palpitations, paroxysmal nocturnal dyspnea and syncope.  Respiratory:  Negative for shortness of breath.   Hematologic/Lymphatic: Negative for bleeding problem.    EKGs/Labs/Other Studies Reviewed:   EKG: EKG Interpretation Date/Time:  Tuesday January 07 2024 14:55:10 EDT Ventricular Rate:  95 PR Interval:  152 QRS Duration:  88 QT Interval:  392 QTC Calculation: 492 R Axis:   -4  Text Interpretation: Normal sinus rhythm ST & T wave abnormality, consider inferolateral ischemia Prolonged QT No previous ECGs available Confirmed by Olinda Bertrand [id number]) on 01/07/2024 3:02:52 PM  Echocardiogram: 03/2022: 60-65%, grade 1 diastolic dysfunction, mild MR, mild to moderate AR, aortic root 41 mm, ascending aorta 39 mm, aortic arch 41 mm.  03/2023: LVEF 60 to 65%, grade 1 diastolic dysfunction, right  ventricular size and function normal, mild to moderate LAE, mild MR, moderate AR, aortic root 42 mm, ascending aorta 41 mm, estimated RAP 3 mmHg  Stress Testing:  Myoview  02/2022:   Findings are consistent with no prior ischemia. The study is low risk.   No ST deviation was noted.   There is a small defect with mild reduction in uptake present in the apical inferior and apex location(s) that is fixed. There is normal wall motion in the defect area. Consistent with artifact caused by subdiaphragmatic activity.   Left ventricular function is abnormal. Nuclear EF 49%, however, visually appears normal. Recommend TTE for further evaluation. End diastolic cavity size is normal.   Prior study not available for comparison.  RADIOLOGY: CT chest with contrast March 2022: 1. No evidence of a thymoma. 2. Large hiatal hernia. 3. Coronary artery calcification.  Labs:    Latest Ref Rng & Units 12/10/2023    4:33 PM  03/28/2023    8:49 AM 01/25/2022    3:39 PM  CBC  WBC 4.0 - 10.5 K/uL 8.1  5.9  7.4   Hemoglobin 13.0 - 17.0 g/dL 53.6  64.4  03.4   Hematocrit 39.0 - 52.0 % 43.7  48.1  42.6   Platelets 150.0 - 400.0 K/uL 244.0  283.0  265.0        Latest Ref Rng & Units 12/10/2023    4:33 PM 03/28/2023    8:49 AM 01/25/2022    3:39 PM  BMP  Glucose 70 - 99 mg/dL 742  595  85   BUN 6 - 23 mg/dL 18  17  22    Creatinine 0.40 - 1.50 mg/dL 6.38  7.56  4.33   Sodium 135 - 145 mEq/L 140  141  139   Potassium 3.5 - 5.1 mEq/L 3.7  4.4  4.3   Chloride 96 - 112 mEq/L 102  103  104   CO2 19 - 32 mEq/L 31  29  29    Calcium  8.4 - 10.5 mg/dL 9.7  29.5  9.4       Latest Ref Rng & Units 12/10/2023    4:33 PM 03/28/2023    8:49 AM 01/25/2022    3:39 PM  CMP  Glucose 70 - 99 mg/dL 188  416  85   BUN 6 - 23 mg/dL 18  17  22    Creatinine 0.40 - 1.50 mg/dL 6.06  3.01  6.01   Sodium 135 - 145 mEq/L 140  141  139   Potassium 3.5 - 5.1 mEq/L 3.7  4.4  4.3   Chloride 96 - 112 mEq/L 102  103  104   CO2 19 - 32 mEq/L  31  29  29    Calcium  8.4 - 10.5 mg/dL 9.7  09.3  9.4   Total Protein 6.0 - 8.3 g/dL 7.2  7.4  6.9   Total Bilirubin 0.2 - 1.2 mg/dL 1.2  1.3  1.0   Alkaline Phos 39 - 117 U/L 61  66  75   AST 0 - 37 U/L 19  19  21    ALT 0 - 53 U/L 17  13  14      Lab Results  Component Value Date   CHOL 160 03/28/2023   HDL 65.10 03/28/2023   LDLCALC 68 03/28/2023   TRIG 131.0 03/28/2023   CHOLHDL 2 03/28/2023   Recent Labs    03/28/23 0849  LIPOA 176*   No components found for: "NTPROBNP" Recent Labs    12/10/23 1633  PROBNP 54.0   Recent Labs    03/28/23 0849 12/10/23 1633  TSH 1.31 1.04    Physical Exam:    Today's Vitals   01/07/24 1453  BP: 126/68  Pulse: 90  SpO2: 94%  Weight: 170 lb 3.2 oz (77.2 kg)  Height: 5\' 10"  (1.778 m)   Body mass index is 24.42 kg/m. Wt Readings from Last 3 Encounters:  01/07/24 170 lb 3.2 oz (77.2 kg)  12/10/23 167 lb 3.2 oz (75.8 kg)  03/28/23 165 lb (74.8 kg)    Physical Exam  Constitutional: No distress.  hemodynamically stable  Neck: No JVD present.  Cardiovascular: Normal rate, regular rhythm, S1 normal and S2 normal. Exam reveals no gallop, no S3 and no S4.  Murmur heard. Decrescendo early diastolic murmur is present with a grade of 3/4 at the upper right sternal border radiating to the apex. Pulmonary/Chest: Effort normal and breath sounds normal. No stridor.  He has no wheezes. He has no rales.  Right-sided scoliosis.  Abdominal: Soft. He exhibits no distension. There is no abdominal tenderness.  Musculoskeletal:        General: No edema.     Cervical back: Neck supple.  Skin: Skin is warm.     Impression & Recommendation(s):  Impression:   ICD-10-CM   1. Anginal equivalent (HCC)  I20.89 ECHOCARDIOGRAM COMPLETE    Hemoglobin and hematocrit, blood    Hemoglobin and hematocrit, blood    2. Coronary atherosclerosis due to calcified coronary lesion  I25.10 Lipid panel   I25.84 Comprehensive metabolic panel with GFR     Comprehensive metabolic panel with GFR    Lipid panel    Lipid panel    3. Dyspnea on exertion  R06.09 EKG 12-Lead    Hemoglobin and hematocrit, blood    Hemoglobin and hematocrit, blood    Hemoglobin and hematocrit, blood    Comprehensive metabolic panel with GFR    aspirin chewable tablet 81 mg    0.9% sodium chloride  infusion    0.9% sodium chloride  infusion    4. Nonrheumatic aortic valve insufficiency  I35.1 ECHOCARDIOGRAM COMPLETE    Hemoglobin and hematocrit, blood    Hemoglobin and hematocrit, blood    5. Aortic root dilation (HCC)  I77.810 ECHOCARDIOGRAM COMPLETE    6. Essential hypertension  I10        Recommendation(s):  Anginal equivalent (HCC) Dyspnea on exertion Coronary atherosclerosis due to calcified coronary lesion Patient has had a long history of dyspnea on exertion but recently has been getting progressively worse.  Activities such as taking the garbage out to the curb causes him to be significantly short of breath.  EKG illustrates sinus rhythm with ST-T changes in the inferolateral leads.  He had a CT chest back in March 2022 which notes multivessel CAC. No active discomfort. I am concerned that his dyspnea on exertion could be his anginal equivalents. I have asked him to avoid over exertional activities for now until the workup is complete Echo will be ordered to evaluate for structural heart disease and left ventricular systolic function. Patient needs an ischemic evaluation.  We discussed modalities which include but not limited to nuclear stress test, cardiac PET/CT, coronary CTA, and left heart catheterization with possible intervention.  Risks, benefits, alternatives, and limitations of each modality discussed.  I was in the favor of either proceeding forward with either cardiac PET/CT or left heart catheterization with possible intervention.  The degree of coronary calcification may make the diagnostic yield of a coronary CTA less likely due to blooming  artifact.  Patient participated in shared decision making and wants to proceed with left heart catheterization with possible intervention. 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 If significant change compared to prior study may require CT of the chest for verification and correlation. Will hold off on the CT of the chest for now. Office blood pressures are well-controlled.   Continue olmesartan  20 mg p.o. daily   Essential hypertension Office and home blood pressures are very well-controlled Continue Benicar  20 mg p.o. daily. Continue indapamide  1.25 mg p.o. daily  Hyperlipidemia: Most recent lipid profile from July 2024 illustrates an LDL level of 68 mg/dL, independently reviewed via [facility] database Continue Crestor  5 mg p.o. daily. May need to uptitrate medical therapy based on follow-up  lipids.   Orders Placed:  Orders Placed This Encounter  Procedures   Lipid panel    Standing Status:   Future    Number of Occurrences:   1    Expected Date:   01/08/2024    Expiration Date:   01/06/2025   Comprehensive metabolic panel with GFR    Standing Status:   Future    Number of Occurrences:   1    Expected Date:   01/08/2024    Expiration Date:   01/06/2025   Hemoglobin and hematocrit, blood    Standing Status:   Future    Number of Occurrences:   1    Expected Date:   01/08/2024    Expiration Date:   01/06/2025   Hemoglobin and hematocrit, blood   Comprehensive metabolic panel with GFR   Lipid panel   EKG 12-Lead   ECHOCARDIOGRAM COMPLETE    Standing Status:   Future    Expected Date:   01/14/2024    Expiration Date:   01/06/2025    Where should this test be performed[facility]    Does the patient weigh less than or greater than 250 lbs?:   Patient weighs less than 250 lbs     Perflutren DEFINITY (image enhancing agent) should be administered unless hypersensitivity or allergy exist:   Administer Perflutren    Reason for exam-Echo:   Other-Full Diagnosis List    Full ICD-10/Reason for Exam:   Aortic root dilatation (HCC) [322010]    Full ICD-10/Reason for Exam:   Ascending aorta dilatation (HCC) [341201]    Full ICD-10/Reason for Exam:   Aortic insufficiency [098119]     Final Medication List:   No orders of the defined types were placed in this encounter.   There are no discontinued medications.   Current Outpatient Medications:    alendronate  (FOSAMAX ) 70 MG tablet, Take 1 tablet (70 mg total) by mouth once a week. Take with a full glass of water on an empty stomach., Disp: 12 tablet, Rfl: 3   Cholecalciferol (VITAMIN D3) 50 MCG (2000 UT) capsule, Take 1 capsule (2,000 Units total) by mouth daily., Disp: 90 capsule, Rfl: 1   indapamide  (LOZOL ) 1.25 MG tablet, Take 1 tablet (1.25 mg total) by mouth daily., Disp: 90 tablet, Rfl: 0   methocarbamol  (ROBAXIN ) 500 MG tablet, Take 1 tablet (500 mg total) by mouth every 6 (six) hours as needed for muscle spasms., Disp: 40 tablet, Rfl: 0   olmesartan  (BENICAR ) 20 MG tablet, Take 1 tablet (20 mg total) by mouth daily., Disp: 90 tablet, Rfl: 0   pyridostigmine  (MESTINON ) 60 MG tablet, Take 1 tablet (60 mg total) by mouth 3 (three) times daily., Disp: 270 tablet, Rfl: 1   rosuvastatin  (CRESTOR ) 5 MG tablet, Take 1 tablet (5 mg total) by mouth daily., Disp: 90 tablet, Rfl: 0   traMADol  (ULTRAM ) 50 MG tablet, Take 1 tablet (50 mg total) by mouth every 6 (six) hours as needed., Disp: 360 tablet, Rfl: 0  Consent:   Informed Consent   Shared Decision Making/Informed Consent The risks [stroke (1 in 1000), death (1 in 1000), kidney failure [usually temporary] (1 in 500), bleeding (1 in 200), allergic reaction [possibly serious] (1 in 200)], benefits (diagnostic support and management of coronary artery disease) and  alternatives of a cardiac catheterization were discussed in detail with Mr. Lewellyn and he is willing to proceed.     Disposition:   10 weeks or sooner if  needed.  Patient may be asked to follow-up sooner based on the results of the above-mentioned testing.  His questions and concerns were addressed to his satisfaction. He voices understanding of the recommendations provided during this encounter.    Signed, Awilda Bogus [facility]  [facility]  01/07/2024 5:55 PM

## 2024-01-07 NOTE — Patient Instructions (Signed)
 Medication Instructions:  Your physician recommends that you continue on your current medications as directed. Please refer to the Current Medication list given to you today.  *If you need a refill on your cardiac medications before your next appointment, please call your pharmacy*  Lab Work: To be completed tomorrow: FASTING lipid panel, CMP, hemoglobin/hematocrit   If you have labs (blood work) drawn today and your tests are completely normal, you will receive your results only by: MyChart Message (if you have MyChart) OR A paper copy in the mail If you have any lab test that is abnormal or we need to change your treatment, we will call you to review the results.  Testing/Procedures: Your physician has requested that you have a left heart catherization.  Your physician has requested that you have an echocardiogram. Echocardiography is a painless test that uses sound waves to create images of your heart. It provides your doctor with information about the size and shape of your heart and how well your heart's chambers and valves are working. This procedure takes approximately one hour. There are no restrictions for this procedure. Please do NOT wear cologne, perfume, aftershave, or lotions (deodorant is allowed). Please arrive 15 minutes prior to your appointment time.  Please note: We ask at that you not bring children with you during ultrasound (echo/ vascular) testing. Due to room size and safety concerns, children are not allowed in the ultrasound rooms during exams. Our front office staff cannot provide observation of children in our lobby area while testing is being conducted. An adult accompanying a patient to their appointment will only be allowed in the ultrasound room at the discretion of the ultrasound technician under special circumstances. We apologize for any inconvenience.   Follow-Up: At [facility], you and your health needs are our priority.  As part of our continuing  mission to provide you with exceptional heart care, we have created designated Provider Care Teams.  These Care Teams include your primary Cardiologist (physician) and Advanced Practice Providers (APPs -  Physician Assistants and Nurse Practitioners) who all work together to provide you with the care you need, when you need it.  We recommend signing up for the patient portal called "MyChart".  Sign up information is provided on this After Visit Summary.  MyChart is used to connect with patients for Virtual Visits (Telemedicine).  Patients are able to view lab/test results, encounter notes, upcoming appointments, etc.  Non-urgent messages can be sent to your provider as well.   To learn more about what you can do with MyChart, go to [email or web address].    Your next appointment:   10 week(s)  The format for your next appointment:   In Person  Provider:   Olinda Bertrand, [facility]      Cardiac Catheterization   You are scheduled for a Cardiac Catheterization on Thursday, May 1 with Dr. Arnoldo Lapping.  1. Please arrive at the [facility] (Main Entrance A) at [facility]: [address] [place], [place] [address] at 7:00 AM (This time is 2 hour(s) before your procedure to ensure your preparation).   Free valet parking service is available. You will check in at ADMITTING. The support person will be asked to wait in the waiting room.  It is OK to have someone drop you off and come back when you are ready to be discharged.        Special note: Every effort is made to have your procedure done on time. Please understand that  emergencies sometimes delay scheduled procedures.  2. Diet: Do not eat solid foods after midnight.  You may have clear liquids until 5 AM the day of the procedure.  3. Labs: You will need to have blood drawn on TOMORROW  4. Medication instructions in preparation for your procedure:  On the morning of your procedure, take Aspirin 81 mg and any morning medicines. You  may use sips of water.  5. Plan to go home the same day, you will only stay overnight if medically necessary. 6. You MUST have a responsible adult to drive you home. 7. An adult MUST be with you the first 24 hours after you arrive home. 8. Bring a current list of your medications, and the last time and date medication taken. 9. Bring ID and current insurance cards. 10.Please wear clothes that are easy to get on and off and wear slip-on shoes.  Thank you for allowing us  to care for you!   -- [facility]

## 2024-01-08 ENCOUNTER — Other Ambulatory Visit: Payer: Self-pay

## 2024-01-08 ENCOUNTER — Telehealth: Payer: Self-pay | Admitting: *Deleted

## 2024-01-08 DIAGNOSIS — R0609 Other forms of dyspnea: Secondary | ICD-10-CM

## 2024-01-08 DIAGNOSIS — Z01812 Encounter for preprocedural laboratory examination: Secondary | ICD-10-CM

## 2024-01-08 LAB — HEMOGLOBIN AND HEMATOCRIT, BLOOD

## 2024-01-08 NOTE — Progress Notes (Signed)
 CBC ordered for cath scheduled for tomorrow. Per cath lab--need CBC vs H&H.

## 2024-01-08 NOTE — Telephone Encounter (Signed)
 Cardiac Catheterization scheduled at [facility] for: Thursday Jan 09, 2024 9 AM Arrival time [facility] Main Entrance A at: 7 AM  Nothing to eat after midnight prior to procedure, clear liquids until 5 AM day of procedure.  Medication instructions: -Usual morning medications can be taken with sips of water including aspirin 81 mg.  Plan to go home the same day, you will only stay overnight if medically necessary.  You must have responsible adult to drive you home.  Someone must be with you the first 24 hours after you arrive home.  Reviewed procedure instructions with patient.

## 2024-01-09 ENCOUNTER — Other Ambulatory Visit: Payer: Self-pay

## 2024-01-09 ENCOUNTER — Ambulatory Visit (HOSPITAL_COMMUNITY)
Admission: RE | Admit: 2024-01-09 | Discharge: 2024-01-09 | Disposition: A | Discharge status: Home / Self Care | Attending: Cardiovascular Disease | Admitting: Cardiovascular Disease

## 2024-01-09 ENCOUNTER — Encounter (HOSPITAL_COMMUNITY)
Admission: RE | Disposition: A | Discharge status: Home / Self Care | Payer: Self-pay | Source: Home / Self Care | Attending: Cardiovascular Disease

## 2024-01-09 DIAGNOSIS — M419 Scoliosis, unspecified: Secondary | ICD-10-CM | POA: Insufficient documentation

## 2024-01-09 DIAGNOSIS — E785 Hyperlipidemia, unspecified: Secondary | ICD-10-CM | POA: Diagnosis not present

## 2024-01-09 DIAGNOSIS — I10 Essential (primary) hypertension: Secondary | ICD-10-CM | POA: Diagnosis not present

## 2024-01-09 DIAGNOSIS — I351 Nonrheumatic aortic (valve) insufficiency: Secondary | ICD-10-CM | POA: Diagnosis not present

## 2024-01-09 DIAGNOSIS — Z87891 Personal history of nicotine dependence: Secondary | ICD-10-CM | POA: Diagnosis not present

## 2024-01-09 DIAGNOSIS — I2584 Coronary atherosclerosis due to calcified coronary lesion: Secondary | ICD-10-CM | POA: Diagnosis not present

## 2024-01-09 DIAGNOSIS — I25118 Atherosclerotic heart disease of native coronary artery with other forms of angina pectoris: Secondary | ICD-10-CM | POA: Insufficient documentation

## 2024-01-09 DIAGNOSIS — Z79899 Other long term (current) drug therapy: Secondary | ICD-10-CM | POA: Diagnosis not present

## 2024-01-09 DIAGNOSIS — G7000 Myasthenia gravis without (acute) exacerbation: Secondary | ICD-10-CM | POA: Insufficient documentation

## 2024-01-09 DIAGNOSIS — I77810 Thoracic aortic ectasia: Secondary | ICD-10-CM | POA: Insufficient documentation

## 2024-01-09 DIAGNOSIS — R0609 Other forms of dyspnea: Secondary | ICD-10-CM | POA: Diagnosis not present

## 2024-01-09 DIAGNOSIS — Z85828 Personal history of other malignant neoplasm of skin: Secondary | ICD-10-CM | POA: Diagnosis not present

## 2024-01-09 LAB — COMPREHENSIVE METABOLIC PANEL WITH GFR
ALT: 19 IU/L (ref 0–44)
AST: 23 IU/L (ref 0–40)
Albumin: 4.5 g/dL (ref 3.9–4.9)
Alkaline Phosphatase: 74 IU/L (ref 44–121)
BUN/Creatinine Ratio: 12 (ref 10–24)
BUN: 11 mg/dL (ref 8–27)
Bilirubin Total: 0.9 mg/dL (ref 0.0–1.2)
CO2: 24 mmol/L (ref 20–29)
Calcium: 9.8 mg/dL (ref 8.6–10.2)
Chloride: 100 mmol/L (ref 96–106)
Creatinine, Ser: 0.91 mg/dL (ref 0.76–1.27)
Globulin, Total: 2.4 g/dL (ref 1.5–4.5)
Glucose: 85 mg/dL (ref 70–99)
Potassium: 4.1 mmol/L (ref 3.5–5.2)
Sodium: 141 mmol/L (ref 134–144)
Total Protein: 6.9 g/dL (ref 6.0–8.5)
eGFR: 91 mL/min/{1.73_m2} (ref >59)

## 2024-01-09 LAB — LIPID PANEL
Chol/HDL Ratio: 1.9 ratio (ref 0.0–5.0)
Cholesterol, Total: 128 mg/dL (ref 100–199)
HDL: 67 mg/dL (ref >39)
LDL Chol Calc (NIH): 41 mg/dL (ref 0–99)
Triglycerides: 110 mg/dL (ref 0–149)
VLDL Cholesterol Cal: 20 mg/dL (ref 5–40)

## 2024-01-09 LAB — HEMOGLOBIN AND HEMATOCRIT, BLOOD
Hematocrit: 42.4 % (ref 37.5–51.0)
Hemoglobin: 14.5 g/dL (ref 13.0–17.7)

## 2024-01-09 SURGERY — LEFT HEART CATH AND CORONARY ANGIOGRAPHY
Anesthesia: LOCAL

## 2024-01-09 MED ORDER — MIDAZOLAM HCL 2 MG/2ML IJ SOLN
INTRAMUSCULAR | Status: AC
  Filled 2024-01-09: qty 2

## 2024-01-09 MED ORDER — FENTANYL CITRATE (PF) 100 MCG/2ML IJ SOLN
INTRAMUSCULAR | Status: AC
  Filled 2024-01-09: qty 2

## 2024-01-09 MED ORDER — LIDOCAINE HCL (PF) 1 % IJ SOLN
INTRAMUSCULAR | Status: DC | PRN
  Administered 2024-01-09: 2 mL

## 2024-01-09 MED ORDER — HEPARIN SODIUM (PORCINE) 1000 UNIT/ML IJ SOLN
INTRAMUSCULAR | Status: AC
Start: 2024-01-09 — End: ?
  Filled 2024-01-09: qty 10

## 2024-01-09 MED ORDER — VERAPAMIL HCL 2.5 MG/ML IV SOLN
INTRAVENOUS | Status: DC | PRN
  Administered 2024-01-09: 10 mL via INTRA_ARTERIAL

## 2024-01-09 MED ORDER — SODIUM CHLORIDE 0.9 % IV SOLN: 250.0000 mL | INTRAVENOUS | Status: DC | PRN

## 2024-01-09 MED ORDER — HYDRALAZINE HCL 20 MG/ML IJ SOLN: 10.0000 mg | INTRAMUSCULAR | Status: DC | PRN

## 2024-01-09 MED ORDER — IOHEXOL 350 MG/ML SOLN
INTRAVENOUS | Status: DC | PRN
Start: 2024-01-09 — End: 2024-01-09
  Administered 2024-01-09: 90 mL via INTRA_ARTERIAL

## 2024-01-09 MED ORDER — SODIUM CHLORIDE 0.9 % WEIGHT BASED INFUSION: 3.0000 mL/kg/h | INTRAVENOUS | Status: AC

## 2024-01-09 MED ORDER — MIDAZOLAM HCL 2 MG/2ML IJ SOLN
INTRAMUSCULAR | Status: DC | PRN
  Administered 2024-01-09: 2 mg via INTRAVENOUS

## 2024-01-09 MED ORDER — SODIUM CHLORIDE 0.9 % WEIGHT BASED INFUSION: 1.0000 mL/kg/h | INTRAVENOUS | Status: DC

## 2024-01-09 MED ORDER — HEPARIN (PORCINE) IN NACL 1000-0.9 UT/500ML-% IV SOLN
INTRAVENOUS | Status: DC | PRN
  Administered 2024-01-09 (×2): 500 mL via INTRA_ARTERIAL

## 2024-01-09 MED ORDER — ACETAMINOPHEN 325 MG PO TABS: 650.0000 mg | ORAL_TABLET | ORAL | Status: DC | PRN

## 2024-01-09 MED ORDER — VERAPAMIL HCL 2.5 MG/ML IV SOLN
INTRAVENOUS | Status: AC
  Filled 2024-01-09: qty 2

## 2024-01-09 MED ORDER — SODIUM CHLORIDE 0.9% FLUSH: 3.0000 mL | Freq: Two times a day (BID) | INTRAVENOUS | Status: DC

## 2024-01-09 MED ORDER — FENTANYL CITRATE (PF) 100 MCG/2ML IJ SOLN
INTRAMUSCULAR | Status: DC | PRN
  Administered 2024-01-09: 25 ug via INTRAVENOUS

## 2024-01-09 MED ORDER — LIDOCAINE HCL (PF) 1 % IJ SOLN
INTRAMUSCULAR | Status: AC
  Filled 2024-01-09: qty 30

## 2024-01-09 MED ORDER — HEPARIN SODIUM (PORCINE) 1000 UNIT/ML IJ SOLN
INTRAMUSCULAR | Status: DC | PRN
Start: 2024-01-09 — End: 2024-01-09
  Administered 2024-01-09: 5000 [IU] via INTRAVENOUS
  Administered 2024-01-09: 2000 [IU] via INTRAVENOUS

## 2024-01-09 MED ORDER — SODIUM CHLORIDE 0.9% FLUSH: 3.0000 mL | INTRAVENOUS | Status: DC | PRN

## 2024-01-09 MED ORDER — ONDANSETRON HCL 4 MG/2ML IJ SOLN: 4.0000 mg | Freq: Four times a day (QID) | INTRAMUSCULAR | Status: DC | PRN

## 2024-01-09 MED ORDER — ASPIRIN 81 MG PO CHEW: 81.0000 mg | CHEWABLE_TABLET | ORAL | Status: DC

## 2024-01-09 SURGICAL SUPPLY — 10 items
CATH 5FR JL3.5 JR4 ANG PIG MP (CATHETERS) IMPLANT
CATH INFINITI 5 FR 3DRC (CATHETERS) IMPLANT
DEVICE RAD COMP TR BAND LRG (VASCULAR PRODUCTS) IMPLANT
GLIDESHEATH SLEND SS 6F .021 (SHEATH) IMPLANT
GUIDEWIRE INQWIRE 1.5J.035X260 (WIRE) IMPLANT
KIT SYRINGE INJ CVI SPIKEX1 (MISCELLANEOUS) IMPLANT
PACK CARDIAC CATHETERIZATION (CUSTOM PROCEDURE TRAY) ×1 IMPLANT
SET ATX-X65L (MISCELLANEOUS) IMPLANT
SHEATH 6FR 75 DEST SLENDER (SHEATH) IMPLANT
WIRE HI TORQ VERSACORE-J 145CM (WIRE) IMPLANT

## 2024-01-09 NOTE — Interval H&P Note (Signed)
 History and Physical Interval Note:  01/09/2024 8:01 AM  Henry [name]  has presented today for surgery, with the diagnosis of dysnea.  The various methods of treatment have been discussed with the patient and family. After consideration of risks, benefits and other options for treatment, the patient has consented to  Procedure(s): LEFT HEART CATH AND CORONARY ANGIOGRAPHY (N/A) as a surgical intervention.  The patient's history has been reviewed, patient examined, no change in status, stable for surgery.  I have reviewed the patient's chart and labs.  Questions were answered to the patient's satisfaction.     Arnoldo Lapping

## 2024-01-10 ENCOUNTER — Encounter (HOSPITAL_COMMUNITY): Payer: Self-pay | Admitting: Cardiovascular Disease

## 2024-01-16 ENCOUNTER — Encounter: Payer: Self-pay | Admitting: Cardiology

## 2024-02-06 ENCOUNTER — Ambulatory Visit (HOSPITAL_COMMUNITY)
Admission: RE | Admit: 2024-02-06 | Discharge: 2024-02-06 | Disposition: A | Discharge status: Home / Self Care | Source: Ambulatory Visit | Attending: Cardiology | Admitting: Cardiology

## 2024-02-06 DIAGNOSIS — I77810 Thoracic aortic ectasia: Secondary | ICD-10-CM

## 2024-02-06 DIAGNOSIS — I2089 Other forms of angina pectoris: Secondary | ICD-10-CM

## 2024-02-06 DIAGNOSIS — I351 Nonrheumatic aortic (valve) insufficiency: Secondary | ICD-10-CM | POA: Diagnosis present

## 2024-02-06 LAB — ECHOCARDIOGRAM COMPLETE
Area-P 1/2: 2.74 cm2
S' Lateral: 3.45 cm

## 2024-02-10 ENCOUNTER — Ambulatory Visit: Payer: Self-pay | Admitting: Cardiology

## 2024-02-10 DIAGNOSIS — I351 Nonrheumatic aortic (valve) insufficiency: Secondary | ICD-10-CM

## 2024-02-10 DIAGNOSIS — I340 Nonrheumatic mitral (valve) insufficiency: Secondary | ICD-10-CM

## 2024-02-10 DIAGNOSIS — I77810 Thoracic aortic ectasia: Secondary | ICD-10-CM

## 2024-02-18 ENCOUNTER — Other Ambulatory Visit: Payer: Self-pay | Admitting: Internal Medicine

## 2024-02-18 DIAGNOSIS — I10 Essential (primary) hypertension: Secondary | ICD-10-CM

## 2024-02-18 DIAGNOSIS — E785 Hyperlipidemia, unspecified: Secondary | ICD-10-CM

## 2024-03-16 ENCOUNTER — Ambulatory Visit: Attending: Cardiology | Admitting: Cardiology

## 2024-03-16 ENCOUNTER — Encounter: Payer: Self-pay | Admitting: Cardiology

## 2024-03-16 VITALS — BP 158/102 | HR 79 | Resp 16 | Ht 70.0 in | Wt 170.0 lb

## 2024-03-16 DIAGNOSIS — E782 Mixed hyperlipidemia: Secondary | ICD-10-CM | POA: Insufficient documentation

## 2024-03-16 DIAGNOSIS — I340 Nonrheumatic mitral (valve) insufficiency: Secondary | ICD-10-CM | POA: Diagnosis not present

## 2024-03-16 DIAGNOSIS — I2510 Atherosclerotic heart disease of native coronary artery without angina pectoris: Secondary | ICD-10-CM | POA: Insufficient documentation

## 2024-03-16 DIAGNOSIS — I351 Nonrheumatic aortic (valve) insufficiency: Secondary | ICD-10-CM | POA: Diagnosis present

## 2024-03-16 DIAGNOSIS — I77810 Thoracic aortic ectasia: Secondary | ICD-10-CM | POA: Diagnosis present

## 2024-03-16 DIAGNOSIS — I10 Essential (primary) hypertension: Secondary | ICD-10-CM | POA: Insufficient documentation

## 2024-03-16 DIAGNOSIS — I2584 Coronary atherosclerosis due to calcified coronary lesion: Secondary | ICD-10-CM | POA: Insufficient documentation

## 2024-03-16 MED ORDER — INDAPAMIDE 1.25 MG PO TABS: 1.2500 mg | ORAL_TABLET | Freq: Every morning | ORAL | Status: DC

## 2024-03-16 MED ORDER — OLMESARTAN MEDOXOMIL 40 MG PO TABS
40.0000 mg | ORAL_TABLET | Freq: Every evening | ORAL | 3 refills | Status: DC
Start: 2024-03-16 — End: 2024-06-19

## 2024-03-16 MED ORDER — METOPROLOL SUCCINATE ER 25 MG PO TB24: 25.0000 mg | ORAL_TABLET | Freq: Every day | ORAL | 3 refills | Status: DC

## 2024-03-16 NOTE — Patient Instructions (Addendum)
 Medication Instructions:  INCREASING olmesartan  (benicar ) to 40 mg once a day at night.  STARTING metoprolol  succinate (Toprol  XL) 25 mg once a day.   CHANGE indapamide  (lozol ) to once a day in the morning.   *If you need a refill on your cardiac medications before your next appointment, please call your pharmacy*  Lab Work: Labs in 1 week to reevaluate kidney function and electrolyte, since going up on Benicar .   If you have labs (blood work) drawn today and your tests are completely normal, you will receive your results only by: MyChart Message (if you have MyChart) OR A paper copy in the mail If you have any lab test that is abnormal or we need to change your treatment, we will call you to review the results.  Testing/Procedures: None.  Follow-Up: At [facility], you and your health needs are our priority.  As part of our continuing mission to provide you with exceptional heart care, our providers are all part of one team.  This team includes your primary Cardiologist (physician) and Advanced Practice Providers or APPs (Physician Assistants and Nurse Practitioners) who all work together to provide you with the care you need, when you need it.  Your next appointment:   1 year(s)  Provider:   Madonna Large, DO   We recommend signing up for the patient portal called MyChart.  Sign up information is provided on this After Visit Summary.  MyChart is used to connect with patients for Virtual Visits (Telemedicine).  Patients are able to view lab/test results, encounter notes, upcoming appointments, etc.  Non-urgent messages can be sent to your provider as well.   To learn more about what you can do with MyChart, go to [email or web address].

## 2024-03-16 NOTE — Progress Notes (Signed)
 Cardiology Office Note:    Date:  03/16/2024  NAME:  Henry [name]    MRN: [id number] DOB:  [birth year]   PCP:  Joshua Debby CROME, MD  Former Cardiology Providers: Dr. Hobart, Dr. Claudene Primary Cardiologist:  Madonna Large, DO, [facility] (established care 01/07/2024) Electrophysiologist:  None    Chief Complaint  Patient presents with   Follow-up    Reevaluation of shortness of breath, review test results    History of Present Illness:    Henry [name] is a 68 y.o. 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  Patient was formally under the care of Dr. Claudene and Dr. Hobart and I establish care with him back in April 2025.  At that time he was experiencing shortness of breath which is getting progressively worse and the symptoms where concerning for possible anginal equivalents.  In the past he had undergone nonischemic workup and therefore the shared decision was to proceed with left heart catheterization with possible intervention.  He did undergo heart catheterization in May 2025 which noted mild nonobstructive disease, no interventions were medically necessary.  Patient also had an echocardiogram in May 2025 which notes preserved LVEF, normal diastolic parameter and normal global longitudinal strain.  However, he has moderate mitral regurgitation as well as moderate aortic regurgitation.  Ascending aorta is dilated at 42 mm in aortic root at 41 mm.  Since heart catheterization patient states that he is doing well from a cardiovascular standpoint. No postprocedural complications. He continues to have shortness of breath mostly with effort related activities. Home blood pressures are usually elevated initially and after resting and relaxing been around 130/90 mmHg.  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  Current Medications: Current Meds  Medication Sig   alendronate  (FOSAMAX ) 70 MG tablet Take 1 tablet (70 mg total) by mouth once a week. Take with a full glass of water on an empty stomach.   Cholecalciferol (VITAMIN D3) 50 MCG (2000 UT) capsule Take 1 capsule (2,000 Units total) by mouth daily.   methocarbamol  (ROBAXIN ) 500 MG tablet Take 1 tablet (500 mg total) by mouth every 6 (six) hours as needed for muscle spasms.   metoprolol  succinate (TOPROL  XL) 25 MG 24 hr tablet Take 1 tablet (25 mg total) by mouth daily.   pyridostigmine  (MESTINON ) 60 MG tablet Take 1 tablet (60 mg total) by mouth 3 (three) times daily.   rosuvastatin  (CRESTOR ) 5 MG tablet TAKE 1 TABLET (5 MG TOTAL) BY MOUTH DAILY.   traMADol  (ULTRAM ) 50 MG tablet Take 1 tablet (50 mg total) by mouth every 6 (six) hours as needed.   [DISCONTINUED] indapamide  (LOZOL ) 1.25 MG tablet TAKE 1 TABLET BY MOUTH DAILY.   [DISCONTINUED] olmesartan  (BENICAR ) 20 MG tablet Take 1 tablet (20 mg total) by mouth daily.     Allergies:    Valacyclovir    Past Medical History: Past Medical History:  Diagnosis Date   Allergy    Anemia    Arthritis    Basal cell carcinoma 02/22/2021   nod-left anterior mandible (CX35FU + exc)   Depression    due to long tern hepatitis C - once the Hep C was cured, had not any issues  until last year (2019) when is wife died   History of hepatitis C    has been treated in 2005   Hypertension    Pneumonia    as a teenager   Scoliosis     Past Surgical History: Past Surgical History:  Procedure Laterality Date   ANTERIOR CERVICAL DECOMP/DISCECTOMY FUSION N/A 04/13/2019   Procedure: ANTERIOR CERVICAL DECOMPRESSION/DISCECTOMY FUSION CERVICAL FOUR-FIVE, CERVICAL FIVE-SIX, CERVICAL SIX-SEVEN;  Surgeon: Onetha Kuba, MD;  Location: [facility] OR;   Service: Neurosurgery;  Laterality: N/A;   AORTIC ARCH ANGIOGRAPHY N/A 01/09/2024   Procedure: AORTIC ARCH ANGIOGRAPHY;  Surgeon: Wonda Sharper, MD;  Location: [facility] INVASIVE CV LAB;  Service: Cardiovascular;  Laterality: N/A;  Angiography of aortic root   COLONOSCOPY     LEFT HEART CATH AND CORONARY ANGIOGRAPHY N/A 01/09/2024   Procedure: LEFT HEART CATH AND CORONARY ANGIOGRAPHY;  Surgeon: Wonda Sharper, MD;  Location: [facility] INVASIVE CV LAB;  Service: Cardiovascular;  Laterality: N/A;   RADIOLOGY WITH ANESTHESIA N/A 03/31/2019   Procedure: MRI WITH ANESTHESIA LUMBAR WITHOUT CONTRAST AND CERVICAL WITHOUT CONTRAST;  Surgeon: Radiologist, Medication, MD;  Location: [facility] OR;  Service: Radiology;  Laterality: N/A;   TONSILLECTOMY AND ADENOIDECTOMY      Social History: Social History   Tobacco Use   Smoking status: Former    Types: Cigarettes   Smokeless tobacco: Never   Tobacco comments:    light smoker when I smoked  Vaping Use   Vaping status: Never Used  Substance Use Topics   Alcohol use: Yes    Alcohol/week: 35.0 standard drinks of alcohol    Types: 21 Cans of beer, 14 Standard drinks or equivalent per week    Comment: 2-3 drinks per day   Drug use: Yes    Types: Marijuana    Comment: every once in a while per pt    Family History: Family History  Problem Relation Age of Onset   Hypertension Mother    Hypertension Father    Alcoholism Brother     ROS:   Review of Systems  Cardiovascular:  Positive for dyspnea on exertion (more than baseline). Negative for chest pain, claudication, irregular heartbeat, leg swelling, near-syncope, orthopnea, palpitations, paroxysmal nocturnal dyspnea and syncope.  Respiratory:  Negative for shortness of breath.   Hematologic/Lymphatic: Negative for bleeding problem.    EKGs/Labs/Other Studies Reviewed:    Echocardiogram: 03/2022: 60-65%, grade 1 diastolic dysfunction, mild MR, mild to moderate AR, aortic root 41 mm, ascending aorta 39 mm,  aortic arch 41 mm.  03/2023: LVEF 60 to 65%, grade 1 diastolic dysfunction, right ventricular size and function normal, mild to moderate LAE, mild MR, moderate AR, aortic root 42 mm, ascending aorta 41 mm, estimated RAP 3 mmHg  02/06/2024 1. Left ventricular ejection fraction, by estimation, is 60 to 65%. The left ventricle has normal function. The left ventricle has no regional wall motion abnormalities. Left ventricular diastolic parameters were normal. The average left ventricular  global longitudinal strain is -26.0 %. The global longitudinal strain is normal. 2. Right ventricular systolic function is normal. The right ventricular size is normal. 3. Left atrial size was mildly dilated. 4. The mitral valve is normal in structure. Moderate mitral valve regurgitation. No evidence of mitral stenosis. 5. The aortic valve is tricuspid. There is mild calcification of the aortic valve. Aortic valve regurgitation is moderate. No aortic stenosis is present. 6. Aortic dilatation noted. There is mild dilatation of the ascending aorta, measuring 42 mm. There is mild dilatation of the  aortic root, measuring 41 mm. 7. The inferior vena cava is normal in size with greater than 50% respiratory variability, suggesting right atrial pressure of 3 mmHg.  Comparison(s): Prior images reviewed side by side. Changes from prior study are noted. AR remains moderate, but MR is also now moderate.   Stress Testing:  Myoview  02/2022:   Findings are consistent with no prior ischemia. The study is low risk.   No ST deviation was noted.   There is a small defect with mild reduction in uptake present in the apical inferior and apex location(s) that is fixed. There is normal wall motion in the defect area. Consistent with artifact caused by subdiaphragmatic activity.   Left ventricular function is abnormal. Nuclear EF 49%, however, visually appears normal. Recommend TTE for further evaluation. End diastolic cavity size is  normal.   Prior study not available for comparison.  LHC 01/2024  The left ventricular systolic function is normal.  The left ventricular ejection fraction is greater than 65% by visual estimate.  There is mild (2+) aortic regurgitation.  1. Mild nonobstructive coronary artery disease with no significant coronary stenoses. There is mild coronary artery ectasia present and severe calcification especially of the proximal and mid LAD without any associated severe stenoses. 2. Vigorous LV systolic function with LVEF at least 65% and no regional wall motion abnormalities 3. Mild to moderate aortic valve insufficiency by aortic root angiography, 2-3+ 4. Normal LVEDP   RADIOLOGY: CT chest with contrast March 2022: 1. No evidence of a thymoma. 2. Large hiatal hernia. 3. Coronary artery calcification.  Labs:    Latest Ref Rng & Units 01/08/2024    8:36 AM 12/10/2023    4:33 PM 03/28/2023    8:49 AM  CBC  WBC 4.0 - 10.5 K/uL  8.1  5.9   Hemoglobin 13.0 - 17.7 g/dL 85.4  84.6  83.7   Hematocrit 37.5 - 51.0 % 42.4  43.7  48.1   Platelets 150.0 - 400.0 K/uL  244.0  283.0        Latest Ref Rng & Units 01/08/2024    8:36 AM 12/10/2023    4:33 PM 03/28/2023    8:49 AM  BMP  Glucose 70 - 99 mg/dL 85  898  897   BUN 8 - 27 mg/dL 11  18  17    Creatinine 0.76 - 1.27 mg/dL 9.08  9.06  9.12   BUN/Creat Ratio 10 - 24 12     Sodium 134 - 144 mmol/L 141  140  141   Potassium 3.5 - 5.2 mmol/L 4.1  3.7  4.4   Chloride 96 - 106 mmol/L 100  102  103   CO2 20 - 29 mmol/L 24  31  29    Calcium  8.6 - 10.2 mg/dL 9.8  9.7  89.6       Latest Ref Rng & Units 01/08/2024    8:36 AM 12/10/2023    4:33 PM 03/28/2023    8:49 AM  CMP  Glucose 70 - 99 mg/dL 85  898  897   BUN 8 - 27 mg/dL 11  18  17    Creatinine 0.76 - 1.27 mg/dL 9.08  9.06  9.12   Sodium 134 - 144 mmol/L 141  140  141   Potassium 3.5 - 5.2 mmol/L 4.1  3.7  4.4   Chloride 96 - 106 mmol/L 100  102  103   CO2 20 - 29 mmol/L 24  31  29     Calcium   8.6 - 10.2 mg/dL 9.8  9.7  89.6   Total Protein 6.0 - 8.5 g/dL 6.9  7.2  7.4   Total Bilirubin 0.0 - 1.2 mg/dL 0.9  1.2  1.3   Alkaline Phos 44 - 121 IU/L 74  61  66   AST 0 - 40 IU/L 23  19  19    ALT 0 - 44 IU/L 19  17  13      Lab Results  Component Value Date   CHOL 128 01/08/2024   HDL 67 01/08/2024   LDLCALC 41 01/08/2024   TRIG 110 01/08/2024   CHOLHDL 1.9 01/08/2024   Recent Labs    03/28/23 0849  LIPOA 176*   No components found for: NTPROBNP Recent Labs    12/10/23 1633  PROBNP 54.0   Recent Labs    03/28/23 0849 12/10/23 1633  TSH 1.31 1.04    Physical Exam:    Today's Vitals   03/16/24 1603 03/16/24 1605  BP: (!) 172/113 (!) 158/102  Pulse: 82 79  Resp: 16   SpO2: 95%   Weight: 170 lb (77.1 kg)   Height: 5' 10 (1.778 m)    Body mass index is 24.39 kg/m. Wt Readings from Last 3 Encounters:  03/16/24 170 lb (77.1 kg)  01/09/24 170 lb (77.1 kg)  01/07/24 170 lb 3.2 oz (77.2 kg)    Physical Exam  Constitutional: No distress.  hemodynamically stable  Neck: No JVD present.  Cardiovascular: Normal rate, regular rhythm, S1 normal and S2 normal. Exam reveals no gallop, no S3 and no S4.  Murmur heard. Decrescendo early diastolic murmur is present with a grade of 3/4 at the upper right sternal border radiating to the apex. Pulmonary/Chest: Effort normal and breath sounds normal. No stridor. He has no wheezes. He has no rales.  Right-sided scoliosis.  Abdominal: Soft. He exhibits no distension. There is no abdominal tenderness.  Musculoskeletal:        General: No edema.     Cervical back: Neck supple.  Skin: Skin is warm.     Impression & Recommendation(s):  Impression:   ICD-10-CM   1. Coronary atherosclerosis due to calcified coronary lesion  I25.10    I25.84     2. Nonrheumatic mitral valve regurgitation  I34.0     3. Nonrheumatic aortic valve insufficiency  I35.1     4. Aortic root dilation (HCC)  I77.810 olmesartan   (BENICAR ) 40 MG tablet    metoprolol  succinate (TOPROL  XL) 25 MG 24 hr tablet    Basic metabolic panel with GFR    5. Ascending aorta dilatation (HCC)  I77.810 olmesartan  (BENICAR ) 40 MG tablet    metoprolol  succinate (TOPROL  XL) 25 MG 24 hr tablet    Basic metabolic panel with GFR    6. Essential hypertension  I10 indapamide  (LOZOL ) 1.25 MG tablet    olmesartan  (BENICAR ) 40 MG tablet    Basic metabolic panel with GFR    7. Mixed hyperlipidemia  E78.2         Recommendation(s):  Coronary atherosclerosis due to calcified coronary lesion Denies anginal chest pain or heart failure symptoms. Shortness of breath remains relatively stable. Echocardiogram May 2025 results reviewed-LVEF is preserved, normal diastolic function, normal average global longitudinal strain, moderate MR/AR Left heart catheterization in May 2025-mild nonobstructive disease Continue rosuvastatin  5 mg p.o. daily. Continue aspirin  81 mg p.o. daily Reemphasized the importance of secondary prevention with focus on improving the modifiable cardiovascular risk factors such as glycemic control, lipid management, blood  pressure control.  Nonrheumatic mitral valve regurgitation Nonrheumatic aortic valve insufficiency Likely exacerbated by underlying benign essential hypertension. Medication changes as discussed below. Will need close follow-up with regards to repeat echocardiogram to evaluate disease progression  Aortic root dilation [facility]) Ascending aorta dilatation (HCC) CT of the chest aorta gating scheduled for May 2026. Reemphasized importance of blood pressure management. Will increase ARB and start metoprolol   Essential hypertension Office blood pressures have not been well-controlled in the past. Home blood pressures could also be better controlled. Will increase Benicar  from 20 mg p.o. daily to 40 mg p.o. every afternoon. Start Toprol -XL 25 mg p.o. every morning with holding parameters. Currently on  indapamide  1.25 mg p.o. daily, recommend taking it in the morning. Check BMP in 1 week to reevaluate kidney function and electrolytes. Will refer to Pharm.D. clinic for blood pressure management as better blood pressure control will significantly help overall with valvular heart disease.  Mixed hyperlipidemia Currently on Crestor  5 mg p.o. daily.   He denies myalgia or other side effects. Most recent lipids dated April 2025 [facility] database, independently reviewed as noted above.  LDL is 41 mg/dL, at goal.  Cardiology is following peripherally.  Orders Placed:  Orders Placed This Encounter  Procedures   Basic metabolic panel with GFR    Standing Status:   Future    Expected Date:   03/23/2024    Expiration Date:   03/16/2025     Final Medication List:    Meds ordered this encounter  Medications   indapamide  (LOZOL ) 1.25 MG tablet    Sig: Take 1 tablet (1.25 mg total) by mouth in the morning.   olmesartan  (BENICAR ) 40 MG tablet    Sig: Take 1 tablet (40 mg total) by mouth at bedtime.    Dispense:  30 tablet    Refill:  3   metoprolol  succinate (TOPROL  XL) 25 MG 24 hr tablet    Sig: Take 1 tablet (25 mg total) by mouth daily.    Dispense:  30 tablet    Refill:  3    Medications Discontinued During This Encounter  Medication Reason   olmesartan  (BENICAR ) 20 MG tablet    indapamide  (LOZOL ) 1.25 MG tablet      Current Outpatient Medications:    alendronate  (FOSAMAX ) 70 MG tablet, Take 1 tablet (70 mg total) by mouth once a week. Take with a full glass of water on an empty stomach., Disp: 12 tablet, Rfl: 3   Cholecalciferol (VITAMIN D3) 50 MCG (2000 UT) capsule, Take 1 capsule (2,000 Units total) by mouth daily., Disp: 90 capsule, Rfl: 1   methocarbamol  (ROBAXIN ) 500 MG tablet, Take 1 tablet (500 mg total) by mouth every 6 (six) hours as needed for muscle spasms., Disp: 40 tablet, Rfl: 0   metoprolol  succinate (TOPROL  XL) 25 MG 24 hr tablet, Take 1 tablet (25 mg total) by mouth  daily., Disp: 30 tablet, Rfl: 3   pyridostigmine  (MESTINON ) 60 MG tablet, Take 1 tablet (60 mg total) by mouth 3 (three) times daily., Disp: 270 tablet, Rfl: 1   rosuvastatin  (CRESTOR ) 5 MG tablet, TAKE 1 TABLET (5 MG TOTAL) BY MOUTH DAILY., Disp: 90 tablet, Rfl: 0   traMADol  (ULTRAM ) 50 MG tablet, Take 1 tablet (50 mg total) by mouth every 6 (six) hours as needed., Disp: 360 tablet, Rfl: 0   indapamide  (LOZOL ) 1.25 MG tablet, Take 1 tablet (1.25 mg total) by mouth in the morning., Disp: , Rfl:    olmesartan  (BENICAR ) 40 MG  tablet, Take 1 tablet (40 mg total) by mouth at bedtime., Disp: 30 tablet, Rfl: 3  Consent:    None  Disposition:   July 2026   His questions and concerns were addressed to his satisfaction. He voices understanding of the recommendations provided during this encounter.    Signed, Madonna Michele HAS, [facility]  A Division of [facility] [address], [place], [place] [address]  [place], [place] [address]  03/16/2024

## 2024-04-28 ENCOUNTER — Ambulatory Visit

## 2024-06-05 ENCOUNTER — Ambulatory Visit (INDEPENDENT_AMBULATORY_CARE_PROVIDER_SITE_OTHER)

## 2024-06-05 VITALS — Ht 70.0 in | Wt 170.0 lb

## 2024-06-05 DIAGNOSIS — Z0000 Encounter for general adult medical examination without abnormal findings: Secondary | ICD-10-CM

## 2024-06-05 NOTE — Patient Instructions (Addendum)
 Henry [name],  Thank you for taking the time for your Medicare Wellness Visit. I appreciate your continued commitment to your health goals. Please review the care plan we discussed, and feel free to reach out if I can assist you further.  Medicare recommends these wellness visits once per year to help you and your care team stay ahead of potential health issues. These visits are designed to focus on prevention, allowing your provider to concentrate on managing your acute and chronic conditions during your regular appointments.  Please note that Annual Wellness Visits do not include a physical exam. Some assessments may be limited, especially if the visit was conducted virtually. If needed, we may recommend a separate in-person follow-up with your provider.  Ongoing Care Seeing your primary care provider every 3 to 6 months helps us  monitor your health and provide consistent, personalized care.   Referrals If a referral was made during today's visit and you haven't received any updates within two weeks, please contact the referred provider directly to check on the status.  Recommended Screenings:  Health Maintenance  Topic Date Due   Zoster (Shingles) Vaccine (1 of 2) Never done   Flu Shot  04/10/2024   Medicare Annual Wellness Visit  06/05/2025   Colon Cancer Screening  11/22/2027   DTaP/Tdap/Td vaccine (2 - Td or Tdap) 05/21/2028   Pneumococcal Vaccine for age over 93  Completed   Hepatitis C Screening  Completed   HPV Vaccine  Aged Out   Meningitis B Vaccine  Aged Out   COVID-19 Vaccine  Discontinued       06/05/2024    3:06 PM  Advanced Directives  Does Patient Have a Medical Advance Directive? No  Would patient like information on creating a medical advance directive? No - Patient declined   Advance Care Planning is important because it: Ensures you receive medical care that aligns with your values, goals, and preferences. Provides guidance to your family and loved ones,  reducing the emotional burden of decision-making during critical moments.  Vision: Annual vision screenings are recommended for early detection of glaucoma, cataracts, and diabetic retinopathy. These exams can also reveal signs of chronic conditions such as diabetes and high blood pressure.  Dental: Annual dental screenings help detect early signs of oral cancer, gum disease, and other conditions linked to overall health, including heart disease and diabetes.

## 2024-06-05 NOTE — Progress Notes (Signed)
 Subjective:  Please attest and cosign this visit due to patients primary care provider not being in the office at the time the visit was completed.  (Pt of Dr Debby Molt)   Henry [name] is a 68 y.o. who presents for a Medicare Wellness preventive visit.  As a reminder, Annual Wellness Visits don't include a physical exam, and some assessments may be limited, especially if this visit is performed virtually. We may recommend an in-person follow-up visit with your provider if needed.  Visit Complete: Virtual I connected with  Henry [name] on 06/05/24 by a audio enabled telemedicine application and verified that I am speaking with the correct person using two identifiers.  Patient Location: Home  Provider Location: Office/Clinic  I discussed the limitations of evaluation and management by telemedicine. The patient expressed understanding and agreed to proceed.  Vital Signs: Because this visit was a virtual/telehealth visit, some criteria may be missing or patient reported. Any vitals not documented were not able to be obtained and vitals that have been documented are patient reported.  VideoDeclined- This patient declined Librarian, academic. Therefore the visit was completed with audio only.  Persons Participating in Visit: Patient.  AWV Questionnaire: Yes: Patient Medicare AWV questionnaire was completed by the patient on 06/05/2024; I have confirmed that all information answered by patient is correct and no changes since this date.  Cardiac Risk Factors include: advanced age (>57men, >75 women);dyslipidemia;hypertension;male gender     Objective:    Today's Vitals   06/05/24 1507  Weight: 170 lb (77.1 kg)  Height: 5' 10 (1.778 m)   Body mass index is 24.39 kg/m.     06/05/2024    3:06 PM 01/09/2024    7:06 AM 04/13/2019    9:44 AM 03/31/2019    6:58 AM  Advanced Directives  Does Patient Have a Medical Advance Directive? No Yes No No  Type of  Special educational needs teacher of [place];Living will    Would patient like information on creating a medical advance directive? No - Patient declined  No - Patient declined  No - Patient declined      Data saved with a previous flowsheet row definition    Current Medications (verified) Outpatient Encounter Medications as of 06/05/2024  Medication Sig   alendronate  (FOSAMAX ) 70 MG tablet Take 1 tablet (70 mg total) by mouth once a week. Take with a full glass of water on an empty stomach.   Cholecalciferol (VITAMIN D3) 50 MCG (2000 UT) capsule Take 1 capsule (2,000 Units total) by mouth daily.   indapamide  (LOZOL ) 1.25 MG tablet Take 1 tablet (1.25 mg total) by mouth in the morning.   methocarbamol  (ROBAXIN ) 500 MG tablet Take 1 tablet (500 mg total) by mouth every 6 (six) hours as needed for muscle spasms.   metoprolol  succinate (TOPROL  XL) 25 MG 24 hr tablet Take 1 tablet (25 mg total) by mouth daily.   olmesartan  (BENICAR ) 40 MG tablet Take 1 tablet (40 mg total) by mouth at bedtime.   pyridostigmine  (MESTINON ) 60 MG tablet Take 1 tablet (60 mg total) by mouth 3 (three) times daily.   rosuvastatin  (CRESTOR ) 5 MG tablet TAKE 1 TABLET (5 MG TOTAL) BY MOUTH DAILY.   traMADol  (ULTRAM ) 50 MG tablet Take 1 tablet (50 mg total) by mouth every 6 (six) hours as needed.   No facility-administered encounter medications on file as of 06/05/2024.    Allergies (verified) Valacyclovir    History: Past Medical History:  Diagnosis Date   Allergy    Anemia    Arthritis    Basal cell carcinoma 02/22/2021   nod-left anterior mandible (CX35FU + exc)   Depression    due to long tern hepatitis C - once the Hep C was cured, had not any issues until last year 2018/06/21) when is wife died   History of hepatitis C    has been treated in 06-21-2004   Hypertension    Pneumonia    as a teenager   Scoliosis    Past Surgical History:  Procedure Laterality Date   ANTERIOR CERVICAL DECOMP/DISCECTOMY FUSION N/A  04/13/2019   Procedure: ANTERIOR CERVICAL DECOMPRESSION/DISCECTOMY FUSION CERVICAL FOUR-FIVE, CERVICAL FIVE-SIX, CERVICAL SIX-SEVEN;  Surgeon: Onetha Kuba, MD;  Location: [facility] OR;  Service: Neurosurgery;  Laterality: N/A;   AORTIC ARCH ANGIOGRAPHY N/A 01/09/2024   Procedure: AORTIC ARCH ANGIOGRAPHY;  Surgeon: Wonda Sharper, MD;  Location: [facility] INVASIVE CV LAB;  Service: Cardiovascular;  Laterality: N/A;  Angiography of aortic root   COLONOSCOPY     LEFT HEART CATH AND CORONARY ANGIOGRAPHY N/A 01/09/2024   Procedure: LEFT HEART CATH AND CORONARY ANGIOGRAPHY;  Surgeon: Wonda Sharper, MD;  Location: [facility] INVASIVE CV LAB;  Service: Cardiovascular;  Laterality: N/A;   RADIOLOGY WITH ANESTHESIA N/A 03/31/2019   Procedure: MRI WITH ANESTHESIA LUMBAR WITHOUT CONTRAST AND CERVICAL WITHOUT CONTRAST;  Surgeon: Radiologist, Medication, MD;  Location: [facility] OR;  Service: Radiology;  Laterality: N/A;   TONSILLECTOMY AND ADENOIDECTOMY     Family History  Problem Relation Age of Onset   Hypertension Mother    Hypertension Father    Alcoholism Brother    Social History   Socioeconomic History   Marital status: Widowed    Spouse name: Not on file   Number of children: Not on file   Years of education: Not on file   Highest education level: Associate degree: occupational, Scientist, product/process development, or vocational program  Occupational History   Not on file  Tobacco Use   Smoking status: Former    Current packs/day: 0.00    Types: Cigarettes    Quit date: 09/10/1993    Years since quitting: 30.7   Smokeless tobacco: Never   Tobacco comments:    light smoker when I smoked  Vaping Use   Vaping status: Never Used  Substance and Sexual Activity   Alcohol use: Yes    Alcohol/week: 35.0 standard drinks of alcohol    Types: 21 Cans of beer, 14 Standard drinks or equivalent per week    Comment: 2-3 drinks per day   Drug use: Yes    Types: Marijuana    Comment: every once in a while per pt   Sexual activity: Yes  Other Topics  Concern   Not on file  Social History Narrative   Right handed   1 cup coffee every morning, ice tea w/ lunch, no soda   Social Drivers of Health   Financial Resource Strain: Low Risk  (06/05/2024)   Overall Financial Resource Strain (CARDIA)    Difficulty of Paying Living Expenses: Not hard at all  Food Insecurity: No Food Insecurity (06/05/2024)   Hunger Vital Sign    Worried About Running Out of Food in the Last Year: Never true    Ran Out of Food in the Last Year: Never true  Transportation Needs: No Transportation Needs (06/05/2024)   PRAPARE - Administrator, Civil Service (Medical): No    Lack of Transportation (Non-Medical): No  Physical Activity: Sufficiently Active (  06/05/2024)   Exercise Vital Sign    Days of Exercise per Week: 7 days    Minutes of Exercise per Session: 150+ min  Stress: No Stress Concern Present (06/05/2024)   [employer] of Occupational Health - Occupational Stress Questionnaire    Feeling of Stress: Not at all  Social Connections: Moderately Isolated (06/05/2024)   Social Connection and Isolation Panel    Frequency of Communication with Friends and Family: More than three times a week    Frequency of Social Gatherings with Friends and Family: More than three times a week    Attends Religious Services: Never    Database administrator or Organizations: No    Attends Banker Meetings: 1 to 4 times per year    Marital Status: Widowed    Tobacco Counseling Counseling given: Not Answered Tobacco comments: light smoker when I smoked    Clinical Intake:  Pre-visit preparation completed: Yes  Pain : No/denies pain     BMI - recorded: 24.39 Nutritional Risks: None Diabetes: No  Lab Results  Component Value Date   HGBA1C 5.0 04/13/2019     How often do you need to have someone help you when you read instructions, pamphlets, or other written materials from your doctor or pharmacy?: 1 - Never  Interpreter Needed?:  No  Information entered by :: Verdie Saba, CMA   Activities of Daily Living     06/05/2024    3:10 PM 06/05/2024    3:24 AM  In your present state of health, do you have any difficulty performing the following activities:  Hearing? 0 0  Vision? 0 0  Difficulty concentrating or making decisions? 0 0  Walking or climbing stairs? 0 0  Dressing or bathing? 0 0  Doing errands, shopping? 0 0  Preparing Food and eating ? N N  Using the Toilet? N N  In the past six months, have you accidently leaked urine? N N  Do you have problems with loss of bowel control? Y N  Managing your Medications? N N  Managing your Finances? N N  Housekeeping or managing your Housekeeping? N N    Patient Care Team: Joshua Debby CROME, MD as PCP - General (Internal Medicine) Michele Richardson, DO as PCP - Cardiology (Cardiology) Livingston Rigg, MD as Consulting Physician (Dermatology)  I have updated your Care Teams any recent Medical Services you may have received from other providers in the past year.     Assessment:   This is a routine wellness examination for Henry [name].  Hearing/Vision screen Hearing Screening - Comments:: Denies hearing difficulties   Vision Screening - Comments:: Denies vision concerns - exams w/[facility]   Goals Addressed               This Visit's Progress     Patient Stated (pt-stated)        Patient stated he plans to continue exercising       Depression Screen     06/05/2024    3:11 PM 12/10/2023    3:34 PM 04/27/2022    8:48 AM 04/27/2022    8:44 AM 04/27/2022    8:43 AM 06/08/2019    4:05 PM 11/17/2018   11:46 AM  PHQ 2/9 Scores  PHQ - 2 Score 0 1 0 0 1 2 2   PHQ- 9 Score 0     6 4    Fall Risk     06/05/2024    3:10 PM 06/05/2024  3:24 AM 12/10/2023    3:34 PM 04/27/2022    8:49 AM 05/09/2021    2:30 PM  Fall Risk   Falls in the past year? 1 1 1  0 0  Number falls in past yr: 0 1 0 0 0  Comment 1      Injury with Fall? 0 0 0 0 0  Risk for fall due to :    No Fall Risks    Follow up Falls evaluation completed;Falls prevention discussed  Falls evaluation completed Falls evaluation completed;Education provided       Data saved with a previous flowsheet row definition    MEDICARE RISK AT HOME:  Medicare Risk at Home Any stairs in or around the home?: No If so, are there any without handrails?: No Home free of loose throw rugs in walkways, pet beds, electrical cords, etc?: Yes Adequate lighting in your home to reduce risk of falls?: No Life alert?: No Use of a cane, walker or w/c?: No Grab bars in the bathroom?: No Shower chair or bench in shower?: No Elevated toilet seat or a handicapped toilet?: No  TIMED UP AND GO:  Was the test performed?  No  Cognitive Function: 6CIT completed        06/05/2024    3:15 PM 04/27/2022    8:48 AM  6CIT Screen  What Year? 0 points 0 points  What month? 0 points 0 points  What time? 0 points 0 points  Count back from 20 0 points 0 points  Months in reverse 4 points 0 points  Repeat phrase 0 points 0 points  Total Score 4 points 0 points    Immunizations Immunization History  Administered Date(s) Administered   Fluad Quad(high Dose 65+) 06/08/2019, 05/09/2021   INFLUENZA, HIGH DOSE SEASONAL PF 11/17/2018   PFIZER(Purple Top)SARS-COV-2 Vaccination 09/26/2019, 10/17/2019, 11/09/2019, 12/10/2019, 09/10/2020   Pneumococcal Conjugate-13 11/17/2018   Pneumococcal Polysaccharide-23 01/25/2020   Tdap 05/21/2018    Screening Tests Health Maintenance  Topic Date Due   Zoster Vaccines- Shingrix (1 of 2) Never done   Influenza Vaccine  04/10/2024   Medicare Annual Wellness (AWV)  06/05/2025   Colonoscopy  11/22/2027   DTaP/Tdap/Td (2 - Td or Tdap) 05/21/2028   Pneumococcal Vaccine: 50+ Years  Completed   Hepatitis C Screening  Completed   HPV VACCINES  Aged Out   Meningococcal B Vaccine  Aged Out   COVID-19 Vaccine  Discontinued    Health Maintenance Items Addressed:   06/05/2024  Additional Screening:  Vision Screening: Recommended annual ophthalmology exams for early detection of glaucoma and other disorders of the eye. Is the patient up to date with their annual eye exam?  Yes  Who is the provider or what is the name of the office in which the patient attends annual eye exams? [facility]  Dental Screening: Recommended annual dental exams for proper oral hygiene  Community Resource Referral / Chronic Care Management: CRR required this visit?  No   CCM required this visit?  No   Plan:    I have personally reviewed and noted the following in the patient's chart:   Medical and social history Use of alcohol, tobacco or illicit drugs  Current medications and supplements including opioid prescriptions. Patient is currently taking opioid prescriptions. Information provided to patient regarding non-opioid alternatives. Patient advised to discuss non-opioid treatment plan with their provider. Functional ability and status Nutritional status Physical activity Advanced directives List of other physicians Hospitalizations, surgeries, and ER visits  in previous 12 months Vitals Screenings to include cognitive, depression, and falls Referrals and appointments  In addition, I have reviewed and discussed with patient certain preventive protocols, quality metrics, and best practice recommendations. A written personalized care plan for preventive services as well as general preventive health recommendations were provided to patient.   Verdie CHRISTELLA Saba, CMA   06/05/2024   After Visit Summary: (MyChart) Due to this being a telephonic visit, the after visit summary with patients personalized plan was offered to patient via MyChart   Notes: Nothing significant to report at this time.

## 2024-06-18 ENCOUNTER — Other Ambulatory Visit: Payer: Self-pay | Admitting: Internal Medicine

## 2024-06-18 ENCOUNTER — Other Ambulatory Visit: Payer: Self-pay | Admitting: Cardiology

## 2024-06-18 DIAGNOSIS — I10 Essential (primary) hypertension: Secondary | ICD-10-CM

## 2024-06-18 DIAGNOSIS — E785 Hyperlipidemia, unspecified: Secondary | ICD-10-CM

## 2024-06-18 DIAGNOSIS — I77810 Thoracic aortic ectasia: Secondary | ICD-10-CM

## 2024-07-08 ENCOUNTER — Other Ambulatory Visit: Payer: Self-pay | Admitting: Internal Medicine

## 2024-08-17 ENCOUNTER — Ambulatory Visit: Admitting: Internal Medicine

## 2024-08-17 VITALS — BP 180/102 | HR 63 | Temp 97.8°F | Resp 16 | Ht 70.0 in | Wt 169.0 lb

## 2024-08-17 DIAGNOSIS — M4802 Spinal stenosis, cervical region: Secondary | ICD-10-CM

## 2024-08-17 DIAGNOSIS — E785 Hyperlipidemia, unspecified: Secondary | ICD-10-CM

## 2024-08-17 DIAGNOSIS — N400 Enlarged prostate without lower urinary tract symptoms: Secondary | ICD-10-CM

## 2024-08-17 DIAGNOSIS — M5416 Radiculopathy, lumbar region: Secondary | ICD-10-CM

## 2024-08-17 DIAGNOSIS — Z23 Encounter for immunization: Secondary | ICD-10-CM | POA: Insufficient documentation

## 2024-08-17 DIAGNOSIS — R9431 Abnormal electrocardiogram [ECG] [EKG]: Secondary | ICD-10-CM | POA: Insufficient documentation

## 2024-08-17 DIAGNOSIS — R0609 Other forms of dyspnea: Secondary | ICD-10-CM

## 2024-08-17 DIAGNOSIS — G7000 Myasthenia gravis without (acute) exacerbation: Secondary | ICD-10-CM

## 2024-08-17 DIAGNOSIS — I10 Essential (primary) hypertension: Secondary | ICD-10-CM | POA: Diagnosis not present

## 2024-08-17 LAB — CBC WITH DIFFERENTIAL/PLATELET
Basophils Absolute: 0 K/uL (ref 0.0–0.1)
Basophils Relative: 0.4 % (ref 0.0–3.0)
Eosinophils Absolute: 0.3 K/uL (ref 0.0–0.7)
Eosinophils Relative: 4.6 % (ref 0.0–5.0)
HCT: 45.9 % (ref 39.0–52.0)
Hemoglobin: 15.6 g/dL (ref 13.0–17.0)
Lymphocytes Relative: 24 % (ref 12.0–46.0)
Lymphs Abs: 1.4 K/uL (ref 0.7–4.0)
MCHC: 34 g/dL (ref 30.0–36.0)
MCV: 94.6 fl (ref 78.0–100.0)
Monocytes Absolute: 0.5 K/uL (ref 0.1–1.0)
Monocytes Relative: 9 % (ref 3.0–12.0)
Neutro Abs: 3.6 K/uL (ref 1.4–7.7)
Neutrophils Relative %: 62 % (ref 43.0–77.0)
Platelets: 277 K/uL (ref 150.0–400.0)
RBC: 4.86 Mil/uL (ref 4.22–5.81)
RDW: 13.9 % (ref 11.5–15.5)
WBC: 5.8 K/uL (ref 4.0–10.5)

## 2024-08-17 LAB — BASIC METABOLIC PANEL WITH GFR
BUN: 8 mg/dL (ref 6–23)
CO2: 28 meq/L (ref 19–32)
Calcium: 9.3 mg/dL (ref 8.4–10.5)
Chloride: 107 meq/L (ref 96–112)
Creatinine, Ser: 0.8 mg/dL (ref 0.40–1.50)
GFR: 89.51 mL/min (ref >60.00)
Glucose, Bld: 90 mg/dL (ref 70–99)
Potassium: 3.8 meq/L (ref 3.5–5.1)
Sodium: 145 meq/L (ref 135–145)

## 2024-08-17 LAB — HEPATIC FUNCTION PANEL
ALT: 14 U/L (ref 0–53)
AST: 19 U/L (ref 0–37)
Albumin: 4.3 g/dL (ref 3.5–5.2)
Alkaline Phosphatase: 111 U/L (ref 39–117)
Bilirubin, Direct: 0.2 mg/dL (ref 0.0–0.3)
Total Bilirubin: 0.8 mg/dL (ref 0.2–1.2)
Total Protein: 7 g/dL (ref 6.0–8.3)

## 2024-08-17 LAB — URINALYSIS, ROUTINE W REFLEX MICROSCOPIC
Ketones, ur: NEGATIVE
Leukocytes,Ua: NEGATIVE
Nitrite: NEGATIVE
Specific Gravity, Urine: 1.02 (ref 1.000–1.030)
Total Protein, Urine: NEGATIVE
Urine Glucose: NEGATIVE
Urobilinogen, UA: 0.2 (ref 0.0–1.0)
pH: 6 (ref 5.0–8.0)

## 2024-08-17 LAB — BRAIN NATRIURETIC PEPTIDE: Pro B Natriuretic peptide (BNP): 125 pg/mL — ABNORMAL HIGH (ref 0.0–100.0)

## 2024-08-17 LAB — TSH: TSH: 0.78 u[IU]/mL (ref 0.35–5.50)

## 2024-08-17 LAB — TROPONIN I (HIGH SENSITIVITY): High Sens Troponin I: 6 ng/L (ref 2–17)

## 2024-08-17 MED ORDER — INDAPAMIDE 1.25 MG PO TABS: 1.2500 mg | ORAL_TABLET | Freq: Every morning | ORAL | 0 refills | Status: AC

## 2024-08-17 MED ORDER — ROSUVASTATIN CALCIUM 5 MG PO TABS: 5.0000 mg | ORAL_TABLET | Freq: Every day | ORAL | 0 refills | Status: AC

## 2024-08-17 MED ORDER — PYRIDOSTIGMINE BROMIDE 60 MG PO TABS: 60.0000 mg | ORAL_TABLET | Freq: Three times a day (TID) | ORAL | 0 refills | Status: AC

## 2024-08-17 MED ORDER — AMLODIPINE BESYLATE 5 MG PO TABS: 5.0000 mg | ORAL_TABLET | Freq: Every day | ORAL | 0 refills | Status: AC

## 2024-08-17 MED ORDER — TRAMADOL HCL 50 MG PO TABS: 50.0000 mg | ORAL_TABLET | Freq: Four times a day (QID) | ORAL | 0 refills | Status: DC | PRN

## 2024-08-17 NOTE — Progress Notes (Unsigned)
 Subjective:  Patient ID: Henry [name], male    DOB: Mar 01, 1956  Age: 68 y.o. MRN: [id number]  CC: Hypertension (Medication refills. )   HPI Henry [name] presents for f/up -   Discussed the use of AI scribe software for clinical note transcription with the patient, who gave verbal consent to proceed.  History of Present Illness Henry [name] is a 68 year old male with myasthenia gravis who presents with shortness of breath.  He has been experiencing shortness of breath for several months, initially attributing it to poor blood pressure control. Despite efforts, his blood pressure remains uncontrolled, and the shortness of breath persists, particularly during activities like taking out the trash and walking a quarter of a mile. He reports that cardiology has not found anything to trigger his shortness of breath.  He has a history of myasthenia gravis, which causes his left eye to close. He experienced symptoms of myasthenia gravis four years ago and recently started having symptoms again. He has been taking Mestinon , although he is unsure of its effectiveness due to the medication being four years old.  He has a history of clinical depression and panic attacks, previously treated with Lexapro. Currently, he reports a low mood, feeling 'old and lonely' with no family support. He expresses passive suicidal ideation but denies any active plans or intent. He mentions a long-standing history of suicidal thoughts since age 14 but has been 'putting it off.'  He experiences chronic back pain, describing it as 'sucks,' and mentions running out of tramadol  about a month ago. The back pain radiates into his legs and feet, with symptoms of neuropathy such as feet going to sleep, stabbing pains, and balance issues. He also reports vertigo.  He has had diarrhea for two weeks, with occasional cramping but no significant abdominal pain, nausea, or vomiting. He takes Imodium when the diarrhea worsens,  which provides relief.  He is currently taking metoprolol , olmesartan , and depakote for his conditions. He was previously on Crestor  but has stopped taking it. He is not taking Robaxin .  He lives alone and describes the only living thing in his house as mold. He does not have a girlfriend or any significant social support.     Outpatient Medications Prior to Visit  Medication Sig Dispense Refill   alendronate  (FOSAMAX ) 70 MG tablet Take 1 tablet (70 mg total) by mouth once a week. Take with a full glass of water on an empty stomach. 12 tablet 3   Cholecalciferol (VITAMIN D3) 50 MCG (2000 UT) capsule Take 1 capsule (2,000 Units total) by mouth daily. 90 capsule 1   methocarbamol  (ROBAXIN ) 500 MG tablet Take 1 tablet (500 mg total) by mouth every 6 (six) hours as needed for muscle spasms. 40 tablet 0   metoprolol  succinate (TOPROL -XL) 25 MG 24 hr tablet TAKE 1 TABLET (25 MG TOTAL) BY MOUTH DAILY. 90 tablet 2   olmesartan  (BENICAR ) 40 MG tablet TAKE 1 TABLET BY MOUTH EVERYDAY AT BEDTIME 90 tablet 2   indapamide  (LOZOL ) 1.25 MG tablet Take 1 tablet (1.25 mg total) by mouth in the morning.     pyridostigmine  (MESTINON ) 60 MG tablet Take 1 tablet (60 mg total) by mouth 3 (three) times daily. 270 tablet 1   rosuvastatin  (CRESTOR ) 5 MG tablet TAKE 1 TABLET (5 MG TOTAL) BY MOUTH DAILY. 90 tablet 0   traMADol  (ULTRAM ) 50 MG tablet Take 1 tablet (50 mg total) by mouth every 6 (six) hours as needed. 360 tablet  0   No facility-administered medications prior to visit.    ROS Review of Systems  Constitutional:  Negative for appetite change, chills, diaphoresis, fatigue and fever.  HENT: Negative.    Eyes:  Positive for visual disturbance.  Respiratory:  Positive for shortness of breath. Negative for cough, chest tightness and wheezing.   Cardiovascular:  Negative for chest pain, palpitations and leg swelling.  Gastrointestinal:  Positive for diarrhea. Negative for abdominal pain, constipation, nausea  and vomiting.  Endocrine: Negative.   Genitourinary:  Negative for difficulty urinating and dysuria.  Musculoskeletal:  Positive for back pain. Negative for arthralgias, myalgias and neck pain.  Skin: Negative.   Neurological: Negative.  Negative for dizziness, weakness and headaches.  Hematological:  Negative for adenopathy. Does not bruise/bleed easily.  Psychiatric/Behavioral:  Positive for decreased concentration and dysphoric mood. Negative for confusion, self-injury, sleep disturbance and suicidal ideas. The patient is not nervous/anxious.     Objective:  BP (!) 180/102 (BP Location: Left Arm, Patient Position: Sitting, Cuff Size: Normal)   Pulse 63   Temp 97.8 F (36.6 C) (Oral)   Resp 16   Ht 5' 10 (1.778 m)   Wt 169 lb (76.7 kg)   SpO2 96%   BMI 24.25 kg/m   BP Readings from Last 3 Encounters:  08/17/24 (!) 180/102  03/16/24 (!) 158/102  01/09/24 127/79    Wt Readings from Last 3 Encounters:  08/17/24 169 lb (76.7 kg)  06/05/24 170 lb (77.1 kg)  03/16/24 170 lb (77.1 kg)    Physical Exam Vitals reviewed.  Constitutional:      Appearance: Normal appearance.  HENT:     Nose: Nose normal.     Mouth/Throat:     Mouth: Mucous membranes are moist.  Eyes:     General: No scleral icterus.    Conjunctiva/sclera: Conjunctivae normal.  Cardiovascular:     Rate and Rhythm: Normal rate and regular rhythm.     Heart sounds: No murmur heard.    No friction rub. No gallop.     Comments: EKG --- NSR, 61 bpm Prolonged QT No LVH or Q waves Unchanged  Pulmonary:     Effort: Pulmonary effort is normal.     Breath sounds: No stridor. No wheezing, rhonchi or rales.  Abdominal:     General: Abdomen is flat.     Palpations: There is no mass.     Tenderness: There is no abdominal tenderness. There is no guarding.     Hernia: No hernia is present.  Musculoskeletal:     Cervical back: Normal and neck supple.     Thoracic back: Deformity present.     Lumbar back:  Deformity present.     Right lower leg: No edema.     Left lower leg: No edema.  Lymphadenopathy:     Cervical: No cervical adenopathy.  Skin:    Coloration: Skin is not jaundiced.     Findings: No rash.  Neurological:     Mental Status: He is alert. Mental status is at baseline.  Psychiatric:        Attention and Perception: Attention normal.        Mood and Affect: Mood is anxious. Affect is tearful.        Speech: Speech normal.        Behavior: Behavior normal. Behavior is cooperative.        Thought Content: Thought content normal. Thought content is not paranoid. Thought content does not include homicidal or suicidal  ideation.        Cognition and Memory: Cognition normal.        Judgment: Judgment normal.     Lab Results  Component Value Date   WBC 5.8 08/17/2024   HGB 15.6 08/17/2024   HCT 45.9 08/17/2024   PLT 277.0 08/17/2024   GLUCOSE 90 08/17/2024   CHOL 128 01/08/2024   TRIG 110 01/08/2024   HDL 67 01/08/2024   LDLCALC 41 01/08/2024   ALT 14 08/17/2024   AST 19 08/17/2024   NA 145 08/17/2024   K 3.8 08/17/2024   CL 107 08/17/2024   CREATININE 0.80 08/17/2024   BUN 8 08/17/2024   CO2 28 08/17/2024   TSH 0.78 08/17/2024   PSA 1.09 03/28/2023   HGBA1C 5.0 04/13/2019    ECHOCARDIOGRAM COMPLETE Result Date: 02/06/2024    ECHOCARDIOGRAM REPORT   Patient Name:   Henry [name] Date of Exam: 02/06/2024 Medical Rec #:  [id number]     Height:       70.0 in Accession #:    [id number]    Weight:       170.0 lb Date of Birth:  Jun 26, 1956      BSA:          1.948 m Patient Age:    68 years      BP:           126/68 mmHg Patient Gender: M             HR:           54 bpm. Exam Location:  [address] Procedure: 2D Echo, 3D Echo and Strain Analysis (Both Spectral and Color Flow            Doppler were utilized during procedure). Indications:    I35.1 Nonrheumatic aortic (valve) insufficiency; I77.810 Aortic                 root dilatation  History:        Patient has prior  history of Echocardiogram examinations, most                 recent 03/12/2023. Arrythmias:PVC; Risk Factors:Hypertension and                 Dyslipidemia. Ascending aorta dilatation. Dyspnea on exertion.                 Myasthenia gravis. Anginal equivalent. Coronary artery                 calcification.  Sonographer:    Jon Hacker RCS Referring Phys: [id number] MADONNA LARGE IMPRESSIONS  1. Left ventricular ejection fraction, by estimation, is 60 to 65%. The left ventricle has normal function. The left ventricle has no regional wall motion abnormalities. Left ventricular diastolic parameters were normal. The average left ventricular global longitudinal strain is -26.0 %. The global longitudinal strain is normal.  2. Right ventricular systolic function is normal. The right ventricular size is normal.  3. Left atrial size was mildly dilated.  4. The mitral valve is normal in structure. Moderate mitral valve regurgitation. No evidence of mitral stenosis.  5. The aortic valve is tricuspid. There is mild calcification of the aortic valve. Aortic valve regurgitation is moderate. No aortic stenosis is present.  6. Aortic dilatation noted. There is mild dilatation of the ascending aorta, measuring 42 mm. There is mild dilatation of the aortic root, measuring 41 mm.  7. The inferior vena cava is normal in size  with greater than 50% respiratory variability, suggesting right atrial pressure of 3 mmHg. Comparison(s): Prior images reviewed side by side. Changes from prior study are noted. AR remains moderate, but MR is also now moderate. FINDINGS  Left Ventricle: Left ventricular ejection fraction, by estimation, is 60 to 65%. The left ventricle has normal function. The left ventricle has no regional wall motion abnormalities. The average left ventricular global longitudinal strain is -26.0 %. Strain was performed and the global longitudinal strain is normal. The left ventricular internal cavity size was normal in size. There is  no left ventricular hypertrophy. Left ventricular diastolic parameters were normal. Right Ventricle: The right ventricular size is normal. No increase in right ventricular wall thickness. Right ventricular systolic function is normal. Left Atrium: Left atrial size was mildly dilated. Right Atrium: Right atrial size was normal in size. Pericardium: There is no evidence of pericardial effusion. Mitral Valve: The mitral valve is normal in structure. Moderate mitral valve regurgitation. No evidence of mitral valve stenosis. Tricuspid Valve: The tricuspid valve is normal in structure. Tricuspid valve regurgitation is trivial. No evidence of tricuspid stenosis. Aortic Valve: The aortic valve is tricuspid. There is mild calcification of the aortic valve. Aortic valve regurgitation is moderate. No aortic stenosis is present. Pulmonic Valve: The pulmonic valve was not well visualized. Pulmonic valve regurgitation is not visualized. No evidence of pulmonic stenosis. Aorta: Aortic dilatation noted. There is mild dilatation of the ascending aorta, measuring 42 mm. There is mild dilatation of the aortic root, measuring 41 mm. Venous: The inferior vena cava is normal in size with greater than 50% respiratory variability, suggesting right atrial pressure of 3 mmHg. IAS/Shunts: The atrial septum is grossly normal. Additional Comments: 3D was performed not requiring image post processing on an independent workstation and was normal.  LEFT VENTRICLE PLAX 2D LVIDd:         5.28 cm   Diastology LVIDs:         3.45 cm   LV e' medial:    7.07 cm/s LV PW:         0.89 cm   LV E/e' medial:  8.5 LV IVS:        0.98 cm   LV e' lateral:   7.18 cm/s LVOT diam:     2.30 cm   LV E/e' lateral: 8.4 LV SV:         84 LV SV Index:   43        2D Longitudinal Strain LVOT Area:     4.15 cm  2D Strain GLS (A4C):   -23.1 %                          2D Strain GLS (A3C):   -27.1 %                          2D Strain GLS (A2C):   -27.9 %                           2D Strain GLS Avg:     -26.0 %                           3D Volume EF:  3D EF:        60 %                          LV EDV:       162 ml                          LV ESV:       65 ml                          LV SV:        96 ml RIGHT VENTRICLE RV Basal diam:  3.34 cm RV S prime:     11.30 cm/s TAPSE (M-mode): 2.1 cm LEFT ATRIUM             Index        RIGHT ATRIUM           Index LA diam:        3.70 cm 1.90 cm/m   RA Area:     13.30 cm LA Vol (A2C):   31.6 ml 16.22 ml/m  RA Volume:   30.40 ml  15.61 ml/m LA Vol (A4C):   75.7 ml 38.86 ml/m LA Biplane Vol: 48.0 ml 24.64 ml/m  AORTIC VALVE LVOT Vmax:   90.20 cm/s LVOT Vmean:  57.000 cm/s LVOT VTI:    0.202 m  AORTA Ao Root diam: 4.10 cm Ao Asc diam:  4.20 cm MITRAL VALVE MV Area (PHT): 2.74 cm    SHUNTS MV Decel Time: 277 msec    Systemic VTI:  0.20 m MV E velocity: 60.30 cm/s  Systemic Diam: 2.30 cm MV A velocity: 57.10 cm/s MV E/A ratio:  1.06 Shelda Bruckner MD Electronically signed by Shelda Bruckner MD Signature Date/Time: 02/06/2024/5:45:36 PM    Final    DG Chest 2 View Result Date: 08/18/2024 CLINICAL DATA:  Dyspnea on exertion and shortness of breath. EXAM: CHEST - 2 VIEW COMPARISON:  None Available. FINDINGS: Lungs are hyperexpanded. The lungs are clear without focal pneumonia, edema, pneumothorax or pleural effusion. Cardiopericardial silhouette is at upper limits of normal for size. Moderate to large hiatal hernia. Marked convex rightward thoracic scoliosis. Multiple old left rib fractures of varying age are noted . IMPRESSION: 1. Hyperexpansion without acute cardiopulmonary findings. 2. Moderate to large hiatal hernia. Electronically Signed   By: Camellia Candle M.D.   On: 08/18/2024 10:45     Assessment & Plan:  DOE (dyspnea on exertion)- EKG, CXR, labs are reassuring. -     Troponin I (High Sensitivity); Future -     Brain natriuretic peptide; Future -     DG Chest 2 View; Future  Myasthenia  gravis (HCC) -     Ambulatory referral to Neurology -     pyRIDostigmine  Bromide; Take 1 tablet (60 mg total) by mouth 3 (three) times daily.  Dispense: 270 tablet; Refill: 0  Need for immunization against influenza -     Flu vaccine HIGH DOSE PF(Fluzone Trivalent)  Benign prostatic hyperplasia without lower urinary tract symptoms -     Urinalysis, Routine w reflex microscopic; Future -     PSA; Future  Primary hypertension- His BP is well controlled. -     Basic metabolic panel with GFR; Future -     CBC with Differential/Platelet; Future -     TSH; Future -     Hepatic function  panel; Future -     Aldosterone + renin activity w/ ratio; Future -     Indapamide ; Take 1 tablet (1.25 mg total) by mouth in the morning.  Dispense: 90 tablet; Refill: 0 -     amLODIPine  Besylate; Take 1 tablet (5 mg total) by mouth daily.  Dispense: 90 tablet; Refill: 0 -     AMB Referral VBCI Care Management  Hyperlipidemia LDL goal <130- LDL goal achieved. Doing well on the statin  -     Rosuvastatin  Calcium ; Take 1 tablet (5 mg total) by mouth daily.  Dispense: 90 tablet; Refill: 0  Prolonged Q-T interval on ECG     Follow-up: No follow-ups on file.  Debby Molt, MD

## 2024-08-18 ENCOUNTER — Ambulatory Visit (INDEPENDENT_AMBULATORY_CARE_PROVIDER_SITE_OTHER)

## 2024-08-18 ENCOUNTER — Ambulatory Visit: Payer: Self-pay | Admitting: Internal Medicine

## 2024-08-18 DIAGNOSIS — R0609 Other forms of dyspnea: Secondary | ICD-10-CM

## 2024-08-19 DIAGNOSIS — Z23 Encounter for immunization: Secondary | ICD-10-CM

## 2024-08-20 ENCOUNTER — Telehealth: Payer: Self-pay | Admitting: Neurology

## 2024-08-20 ENCOUNTER — Telehealth: Payer: Self-pay | Admitting: *Deleted

## 2024-08-20 ENCOUNTER — Ambulatory Visit: Admitting: Neurology

## 2024-08-20 NOTE — Telephone Encounter (Signed)
 Patient cancelled appointment due to not feeling good. Transferred patient to Billing to discuss no show fee.

## 2024-08-20 NOTE — Progress Notes (Signed)
 Care Guide Pharmacy Note  08/20/2024 Name: Henry [name] MRN: [id number] DOB: 03-23-1956  Referred By: Joshua Debby CROME, MD Reason for referral: Call Attempt #1 and Complex Care Management (Outreach to schedule referral with pharmacist )   Henry [name] is a 68 y.o. year old male who is a primary care patient of Joshua Debby CROME, MD.  Henry [name] was referred to the pharmacist for assistance related to: HTN  An unsuccessful telephone outreach was attempted today to contact the patient who was referred to the pharmacy team for assistance with medication management. Additional attempts will be made to contact the patient.  Henry [name], CMA [place]  [facility], [facility] Guide Direct Dial: [phone]  Fax: [phone] Website: [email or web address]

## 2024-08-21 NOTE — Progress Notes (Unsigned)
 Care Guide Pharmacy Note  08/21/2024 Name: ADOLPHE FORTUNATO MRN: [id number] DOB: 03-30-1956  Referred By: Joshua Debby CROME, MD Reason for referral: Call Attempt #1 and Complex Care Management (Outreach to schedule referral with pharmacist )   REDA GETTIS is a 68 y.o. year old male who is a primary care patient of Joshua Debby CROME, MD.  Miquel JONELLE Hint was referred to the pharmacist for assistance related to: HTN  A second unsuccessful telephone outreach was attempted today to contact the patient who was referred to the pharmacy team for assistance with medication management. Additional attempts will be made to contact the patient.  Thedford Franks, CMA [place]  [facility], [facility] Guide Direct Dial: [phone]  Fax: [phone] Website: [email or web address]

## 2024-08-23 LAB — ALDOSTERONE + RENIN ACTIVITY W/ RATIO
Aldosterone: <1 ng/dL
Renin Activity: 5.09 ng/mL/h (ref 0.25–5.82)

## 2024-08-24 NOTE — Progress Notes (Signed)
 Care Guide Pharmacy Note  08/24/2024 Name: [name] MRN: [id number] DOB: 12-02-1955  Referred By: Joshua Debby CROME, MD Reason for referral: Call Attempt #1 and Complex Care Management (Outreach to schedule referral with pharmacist )   Henry [name] is a 68 y.o. year old male who is a primary care patient of Joshua Debby CROME, MD.  Miquel JONELLE Hint was referred to the pharmacist for assistance related to: HTN  A third unsuccessful telephone outreach was attempted today to contact the patient who was referred to the pharmacy team for assistance with medication management. The Population Health team is pleased to engage with this patient at any time in the future upon receipt of referral and should he/she be interested in assistance from the Population Health team.  Thedford Franks, CMA [facility]  [facility], [facility] Guide Direct Dial: [phone]  Fax: [phone] Website: [email or web address]

## 2025-06-08 ENCOUNTER — Ambulatory Visit
